# Patient Record
Sex: Male | Born: 1935 | Race: White | Hispanic: No | Marital: Married | Smoking: Former smoker
Health system: Southern US, Community
[De-identification: ages and names within clinical notes are randomized; demographics above are authoritative.]

## PROBLEM LIST (undated history)

## (undated) DIAGNOSIS — M2550 Pain in unspecified joint: Secondary | ICD-10-CM

## (undated) DIAGNOSIS — M1990 Unspecified osteoarthritis, unspecified site: Secondary | ICD-10-CM

## (undated) DIAGNOSIS — R1030 Lower abdominal pain, unspecified: Secondary | ICD-10-CM

## (undated) DIAGNOSIS — I10 Essential (primary) hypertension: Secondary | ICD-10-CM

## (undated) DIAGNOSIS — E7800 Pure hypercholesterolemia, unspecified: Secondary | ICD-10-CM

## (undated) DIAGNOSIS — K635 Polyp of colon: Secondary | ICD-10-CM

## (undated) DIAGNOSIS — IMO0001 Reserved for inherently not codable concepts without codable children: Secondary | ICD-10-CM

## (undated) DIAGNOSIS — H9190 Unspecified hearing loss, unspecified ear: Secondary | ICD-10-CM

## (undated) DIAGNOSIS — I219 Acute myocardial infarction, unspecified: Secondary | ICD-10-CM

## (undated) DIAGNOSIS — I2510 Atherosclerotic heart disease of native coronary artery without angina pectoris: Secondary | ICD-10-CM

## (undated) DIAGNOSIS — M2540 Effusion, unspecified joint: Secondary | ICD-10-CM

## (undated) DIAGNOSIS — R233 Spontaneous ecchymoses: Secondary | ICD-10-CM

## (undated) DIAGNOSIS — G4730 Sleep apnea, unspecified: Secondary | ICD-10-CM

## (undated) DIAGNOSIS — R238 Other skin changes: Secondary | ICD-10-CM

## (undated) HISTORY — PX: KNEE SURGERY: SHX244

## (undated) HISTORY — DX: Atherosclerotic heart disease of native coronary artery without angina pectoris: I25.10

## (undated) HISTORY — DX: Essential (primary) hypertension: I10

## (undated) HISTORY — DX: Pure hypercholesterolemia, unspecified: E78.00

## (undated) HISTORY — DX: Acute myocardial infarction, unspecified: I21.9

## (undated) HISTORY — DX: Unspecified osteoarthritis, unspecified site: M19.90

## (undated) HISTORY — PX: CORONARY ANGIOPLASTY: SHX604

## (undated) HISTORY — DX: Lower abdominal pain, unspecified: R10.30

## (undated) HISTORY — PX: CARDIAC CATHETERIZATION: SHX172

## (undated) HISTORY — PX: COLONOSCOPY: SHX174

---

## 1988-04-01 HISTORY — PX: CARTILAGE SURGERY: SHX1303

## 2003-01-16 ENCOUNTER — Observation Stay (HOSPITAL_COMMUNITY): Admission: EM | Admit: 2003-01-16 | Discharge: 2003-01-17 | Payer: Self-pay | Admitting: Emergency Medicine

## 2003-01-17 ENCOUNTER — Encounter: Payer: Self-pay | Admitting: Cardiology

## 2006-11-25 ENCOUNTER — Ambulatory Visit: Payer: Self-pay | Admitting: Gastroenterology

## 2006-12-01 LAB — HM COLONOSCOPY

## 2006-12-12 ENCOUNTER — Ambulatory Visit: Payer: Self-pay | Admitting: Gastroenterology

## 2006-12-12 ENCOUNTER — Encounter: Payer: Self-pay | Admitting: Gastroenterology

## 2007-01-22 ENCOUNTER — Encounter: Admission: RE | Admit: 2007-01-22 | Discharge: 2007-01-22 | Payer: Self-pay | Admitting: Orthopedic Surgery

## 2010-02-13 ENCOUNTER — Encounter: Admission: RE | Admit: 2010-02-13 | Discharge: 2010-02-13 | Payer: Self-pay | Admitting: Orthopedic Surgery

## 2010-08-17 NOTE — Discharge Summary (Signed)
NAMERANSOM, NICKSON NO.:  [id number]   MEDICAL RECORD NO.:  [id number]                   PATIENT TYPE:  OBV   LOCATION:  3738                                 FACILITY:  [facility]   PHYSICIAN:  Peter M. Swaziland, M.D.               DATE OF BIRTH:  May 10, 1934   DATE OF ADMISSION:  01/16/2003  DATE OF DISCHARGE:  01/17/2003                                 DISCHARGE SUMMARY   HISTORY OF PRESENT ILLNESS:  Mr. Roper is a 76 year old white male with  known history of coronary artery disease status post stenting of the left  anterior descending artery in 1998.  His last stress test was in June of  2002 and was normal.  The day of admission patient was working in his yard  and approximately 1 p.m. developed a left precordial chest pain that was  worse with deep breathing.  It was localized.  He had no nausea, vomiting,  or diaphoresis.  He had no shortness of breath.  His pain resolved after  admission to the emergency room.  He still had some persistent chest  soreness.  The patient has been compliant with his diet and medications.  He  had a recent physical with Gwen Pounds, M.D.  Apparently, everything  checked out well including his lipids.  He was admitted to rule out  myocardial infarction.  For details of his past medical history, social  history, family history, and physical examination, please see admission  history and physical.   LABORATORY DATA:  ECG showed normal sinus rhythm with nonspecific ST-T wave  changes.  Chest x-ray showed no active disease.  Point of care cardiac  enzyme testing was negative.  Subsequent CPK-MB and troponins were negative  x2.  His white count was 6800, hemoglobin 13.0, hematocrit 37.5, platelets  160,000.  Creatinine 1.3.  LFTs were normal.  Lipid panel was pending at the  time of discharge.  BUN 19, creatinine 1.3, sodium 141, potassium 3.9,  chloride 107, CO2 20, glucose 87.   HOSPITAL COURSE:  The patient was  admitted to telemetry.  He was treated  with subcutaneous Lovenox.  He subsequently ruled out for myocardial  infarction.  On January 17, 2003 he underwent a stress Cardiolite study.  He  was able to exercise for 11 minutes on the Bruce protocol with peak heart  rate of 129.  His target was 130.  He had no chest pain, no significant ST  segment changes.  Subsequent Cardiolite images were normal.  Ejection  fraction is calculated at 55%.  The patient was noted to be hypertensive in  the hospital with blood pressure 150/90.  We recommended doubling his Altace  dose.  Otherwise, it was felt that he was stable for discharge.  He was  discharged home on January 17, 2003.   DISCHARGE DIAGNOSES:  1. Chest pain, musculoskeletal.  2.  Arteriosclerotic coronary artery disease status post remote stenting of     the left anterior descending now with normal stress Cardiolite study.  3. Hypertension.  4. Dyslipidemia.   DISCHARGE MEDICATIONS:  1. Toprol XL 50 mg daily.  2. Aspirin 81 mg daily.  3. Lipitor resume prior dose.  4. Altace double his prior dose.    FOLLOWUP:  The patient will follow up with Peter M. Swaziland, M.D. in two  weeks.   DISCHARGE STATUS:  Improved.                                                Peter M. Swaziland, M.D.    PMJ/MEDQ  D:  01/17/2003  T:  01/18/2003  Job:  [id number]   cc:   Gwen Pounds, M.D.  [address]  [place]  [place] [address]  Fax: [phone]

## 2010-12-11 ENCOUNTER — Encounter: Payer: Self-pay | Admitting: Cardiology

## 2011-03-18 ENCOUNTER — Other Ambulatory Visit: Payer: Self-pay | Admitting: Orthopedic Surgery

## 2011-03-18 DIAGNOSIS — M169 Osteoarthritis of hip, unspecified: Secondary | ICD-10-CM

## 2011-03-19 ENCOUNTER — Ambulatory Visit
Admission: RE | Admit: 2011-03-19 | Discharge: 2011-03-19 | Disposition: A | Discharge status: Home / Self Care | Payer: Medicare Other | Source: Ambulatory Visit | Attending: Orthopedic Surgery | Admitting: Orthopedic Surgery

## 2011-03-19 DIAGNOSIS — M169 Osteoarthritis of hip, unspecified: Secondary | ICD-10-CM

## 2011-03-19 MED ORDER — METHYLPREDNISOLONE ACETATE 40 MG/ML INJ SUSP (RADIOLOG: 120.0000 mg | Freq: Once | INTRAMUSCULAR | Status: DC

## 2011-03-19 MED ORDER — IOHEXOL 180 MG/ML  SOLN: 3.0000 mL | Freq: Once | INTRAMUSCULAR | Status: AC | PRN

## 2011-04-18 DIAGNOSIS — H903 Sensorineural hearing loss, bilateral: Secondary | ICD-10-CM | POA: Diagnosis not present

## 2011-04-23 ENCOUNTER — Telehealth: Payer: Self-pay | Admitting: Cardiology

## 2011-04-23 ENCOUNTER — Other Ambulatory Visit: Payer: Self-pay | Admitting: Orthopedic Surgery

## 2011-04-23 DIAGNOSIS — M169 Osteoarthritis of hip, unspecified: Secondary | ICD-10-CM | POA: Diagnosis not present

## 2011-04-23 DIAGNOSIS — M25559 Pain in unspecified hip: Secondary | ICD-10-CM | POA: Diagnosis not present

## 2011-04-23 NOTE — Telephone Encounter (Signed)
Patient called,states he saw Dr.Jordan 10 yrs ago.Needs appointment for surgical clearance.Scheduled for rt hip replacement 05/13/11 with Dr.Frank Turner Daniels.Appointment scheduled with Dr.Jordan 05/09/11.

## 2011-04-23 NOTE — Telephone Encounter (Signed)
New problem Pt is having hip surgery -he wants surgical clearance please fax to [facility]: Lambert Rhonda W elume [phone]

## 2011-05-02 ENCOUNTER — Encounter (HOSPITAL_COMMUNITY): Payer: Self-pay | Admitting: Pharmacy Technician

## 2011-05-09 ENCOUNTER — Encounter (HOSPITAL_COMMUNITY): Payer: Self-pay

## 2011-05-09 ENCOUNTER — Other Ambulatory Visit: Payer: Self-pay | Admitting: Orthopedic Surgery

## 2011-05-09 ENCOUNTER — Encounter (HOSPITAL_COMMUNITY)
Admission: RE | Admit: 2011-05-09 | Discharge: 2011-05-09 | Disposition: A | Discharge status: Home / Self Care | Payer: Medicare Other | Source: Ambulatory Visit | Attending: Anesthesiology | Admitting: Anesthesiology

## 2011-05-09 ENCOUNTER — Encounter (HOSPITAL_COMMUNITY)
Admission: RE | Admit: 2011-05-09 | Discharge: 2011-05-09 | Disposition: A | Discharge status: Home / Self Care | Payer: Medicare Other | Source: Ambulatory Visit | Attending: Orthopedic Surgery | Admitting: Orthopedic Surgery

## 2011-05-09 ENCOUNTER — Encounter: Payer: Self-pay | Admitting: Cardiology

## 2011-05-09 ENCOUNTER — Ambulatory Visit (INDEPENDENT_AMBULATORY_CARE_PROVIDER_SITE_OTHER): Payer: Medicare Other | Admitting: Cardiology

## 2011-05-09 VITALS — BP 132/80 | HR 62 | Ht 71.0 in | Wt 201.4 lb

## 2011-05-09 DIAGNOSIS — Z01818 Encounter for other preprocedural examination: Secondary | ICD-10-CM

## 2011-05-09 DIAGNOSIS — I10 Essential (primary) hypertension: Secondary | ICD-10-CM | POA: Diagnosis not present

## 2011-05-09 DIAGNOSIS — E7800 Pure hypercholesterolemia, unspecified: Secondary | ICD-10-CM

## 2011-05-09 DIAGNOSIS — I2510 Atherosclerotic heart disease of native coronary artery without angina pectoris: Secondary | ICD-10-CM | POA: Insufficient documentation

## 2011-05-09 DIAGNOSIS — Z01811 Encounter for preprocedural respiratory examination: Secondary | ICD-10-CM | POA: Diagnosis not present

## 2011-05-09 DIAGNOSIS — E785 Hyperlipidemia, unspecified: Secondary | ICD-10-CM | POA: Insufficient documentation

## 2011-05-09 HISTORY — DX: Sleep apnea, unspecified: G47.30

## 2011-05-09 HISTORY — DX: Unspecified hearing loss, unspecified ear: H91.90

## 2011-05-09 HISTORY — DX: Polyp of colon: K63.5

## 2011-05-09 HISTORY — DX: Pain in unspecified joint: M25.50

## 2011-05-09 HISTORY — DX: Reserved for inherently not codable concepts without codable children: IMO0001

## 2011-05-09 HISTORY — DX: Effusion, unspecified joint: M25.40

## 2011-05-09 HISTORY — DX: Other skin changes: R23.8

## 2011-05-09 HISTORY — DX: Spontaneous ecchymoses: R23.3

## 2011-05-09 LAB — BASIC METABOLIC PANEL
CO2: 30 mEq/L (ref 19–32)
Calcium: 9.9 mg/dL (ref 8.4–10.5)
Chloride: 106 mEq/L (ref 96–112)
Creatinine, Ser: 0.86 mg/dL (ref 0.50–1.35)
Glucose, Bld: 97 mg/dL (ref 70–99)
Sodium: 144 mEq/L (ref 135–145)

## 2011-05-09 LAB — URINALYSIS, ROUTINE W REFLEX MICROSCOPIC
Leukocytes, UA: NEGATIVE
Nitrite: NEGATIVE
Specific Gravity, Urine: 1.015 (ref 1.005–1.030)
Urobilinogen, UA: 0.2 mg/dL (ref 0.0–1.0)
pH: 5 (ref 5.0–8.0)

## 2011-05-09 LAB — CBC
HCT: 43.6 % (ref 39.0–52.0)
MCH: 30.9 pg (ref 26.0–34.0)
MCV: 93 fL (ref 78.0–100.0)
Platelets: 135 10*3/uL — ABNORMAL LOW (ref 150–400)
RBC: 4.69 MIL/uL (ref 4.22–5.81)
WBC: 6.9 10*3/uL (ref 4.0–10.5)

## 2011-05-09 LAB — ABO/RH: ABO/RH(D): B POS

## 2011-05-09 LAB — DIFFERENTIAL
Eosinophils Absolute: 0.1 10*3/uL (ref 0.0–0.7)
Eosinophils Relative: 1 % (ref 0–5)
Lymphocytes Relative: 21 % (ref 12–46)
Lymphs Abs: 1.5 10*3/uL (ref 0.7–4.0)
Monocytes Absolute: 0.6 10*3/uL (ref 0.1–1.0)

## 2011-05-09 LAB — TYPE AND SCREEN: Antibody Screen: NEGATIVE

## 2011-05-09 LAB — SURGICAL PCR SCREEN
MRSA, PCR: NEGATIVE
Staphylococcus aureus: NEGATIVE

## 2011-05-09 LAB — APTT: aPTT: 31 seconds (ref 24–37)

## 2011-05-09 MED ORDER — CHLORHEXIDINE GLUCONATE 4 % EX LIQD: 60.0000 mL | Freq: Once | CUTANEOUS | Status: DC

## 2011-05-09 NOTE — Progress Notes (Signed)
Jeremy Sawyer Date of Birth: 1934-08-10 Medical Record #[id number]  History of Present Illness: Jeremy Sawyer is seen today for preoperative clearance for hip surgery. He is a 77 year old white male with history of coronary disease status post stenting of the LAD in 1998. His last cardiac evaluation was 2004 at which time he had a normal nuclear stress test. He reports that he has done very well. He denies any recurrent symptoms of chest pain or shortness of breath. He has been fairly active but has been limited recently by right hip arthritis. He reports his blood pressure and cholesterol have been under good control.  Current Outpatient Prescriptions on File Prior to Visit  Medication Sig Dispense Refill  . amLODipine (NORVASC) 10 MG tablet Take 10 mg by mouth daily.      Mar[place] Kitchen aspirin 81 MG tablet Take 81 mg by mouth daily.      Mar[place] Kitchen atorvastatin (LIPITOR) 40 MG tablet Take 40 mg by mouth daily.      . celecoxib (CELEBREX) 200 MG capsule Take 200 mg by mouth 2 (two) times daily.      . metoprolol succinate (TOPROL-XL) 100 MG 24 hr tablet Take 100 mg by mouth daily. Take with or immediately following a meal.      . Misc Natural Products (OSTEO BI-FLEX ADV DOUBLE ST PO) Take 1 tablet by mouth 2 (two) times daily.      . ramipril (ALTACE) 10 MG capsule Take 10 mg by mouth daily.        No Known Allergies  Past Medical History  Diagnosis Date  . Osteoarthritis     Right hip  . Groin pain     Right  . CAD (coronary artery disease)   . Hypercholesterolemia     takes Lipitor daily  . Arthritis   . Hypertension     takes Amlodipine,Ramipril,and Metoprolol daily  . Myocardial infarction     Past Surgical History  Procedure Date  . Cartilage surgery 1990    Right knee  . Knee surgery     bilateral  . Cardiac catheterization >75yrs ago  . Colonoscopy   . Coronary angioplasty     1 stent    History  Smoking status  . Former Smoker  Smokeless tobacco  . Not on file     History  Alcohol Use  . Yes    Family History  Problem Relation Age of Onset  . Heart disease Brother   . Anesthesia problems Neg Hx   . Hypotension Neg Hx   . Malignant hyperthermia Neg Hx   . Pseudochol deficiency Neg Hx     Review of Systems: The review of systems is positive for right hip pain.  All other systems were reviewed and are negative.  Physical Exam: BP 132/80  Pulse 62  Ht 5\' 11"  (1.803 m)  Wt 201 lb 6.4 oz (91.354 kg)  BMI 28.09 kg/m2 The patient is alert and oriented x 3.  The mood and affect are normal.  The skin is warm and dry.  Color is normal.  The HEENT exam reveals that the sclera are nonicteric.  The mucous membranes are moist.  The carotids are 2+ without bruits.  There is no thyromegaly.  There is no JVD.  The lungs are clear.  The chest wall is non tender.  The heart exam reveals a regular rate with a normal S1 and S2.  There are no murmurs, gallops, or rubs.  The PMI is not displaced.  Abdominal exam reveals good bowel sounds.  There is no guarding or rebound.  There is no hepatosplenomegaly or tenderness.  There are no masses.  Exam of the legs reveal no clubbing, cyanosis, or edema.  The legs are without rashes.  The distal pulses are intact.  Cranial nerves II - XII are intact.  Motor and sensory functions are intact.  The gait is normal.  LABORATORY DATA: ECG demonstrates normal sinus rhythm with a normal ECG. His most recent blood work in September 2012 showed a normal chemistry panel and CBC. Total cholesterol 132, triglycerides 74, HDL 46, LDL 71.  Assessment / Plan:

## 2011-05-09 NOTE — Assessment & Plan Note (Signed)
He is status post remote stenting of the proximal LAD. He has been asymptomatic. He is scheduled for intermediate risk surgery on his right hip. I recommended a nuclear stress test to assess his preoperative cardiovascular risk. If this is normal we can clear him for surgery.

## 2011-05-09 NOTE — Patient Instructions (Signed)
We will schedule you for a nuclear stress test. Hold your metoprolol the day of the test.  Continue your other medications.

## 2011-05-09 NOTE — Assessment & Plan Note (Signed)
His lipids are under excellent control on his current medical therapy.

## 2011-05-09 NOTE — Progress Notes (Signed)
Dr.Peter Swaziland is cardiologist and pt just left his office prior to coming for PAT  To be having a stress test on Feb 11,2013  EKG done in office today-follow up on report(not in epic yet-just done 1hour ago)

## 2011-05-09 NOTE — Pre-Procedure Instructions (Signed)
20 Jeremy Sawyer [facility]  05/09/2011   Your procedure is scheduled on:  Wed, Feb 13 @ 1:00 pm  Report to [facility] at 11 AM.  Call this number if you have problems the morning of surgery: [phone]   Remember:   Do not eat food:After Midnight.  May have clear liquids: up to 4 Hours before arrival.(until 7:00 am)  Clear liquids include soda, tea, black coffee, apple or grape juice, broth.  Take these medicines the morning of surgery with A SIP OF WATER: Amlodipine,Metoprolol,and Celebrex   Do not wear jewelry, make-up or nail polish.  Do not wear lotions, powders, or perfumes. You may wear deodorant.  Do not shave 48 hours prior to surgery.  Do not bring valuables to the hospital.  Contacts, dentures or bridgework may not be worn into surgery.  Leave suitcase in the car. After surgery it may be brought to your room.  For patients admitted to the hospital, checkout time is 11:00 AM the day of discharge.   Patients discharged the day of surgery will not be allowed to drive home.  Name and phone number of your driver:   Special Instructions: CHG Shower Use Special Wash: 1/2 bottle night before surgery and 1/2 bottle morning of surgery.   Please read over the following fact sheets that you were given: Pain Booklet, Coughing and Deep Breathing, Blood Transfusion Information, Total Joint Packet, MRSA Information and Surgical Site Infection Prevention

## 2011-05-09 NOTE — Assessment & Plan Note (Signed)
Blood pressure is well controlled on ACE inhibitor and beta blocker therapy.

## 2011-05-10 NOTE — Progress Notes (Signed)
Orders for consent read pt. To have total knee replacement ,pt. And OR schedule say he is having total hip replacement.Office has been notified.  Waiting for results of  Stress Test scheduled for 05/13/2011.

## 2011-05-13 ENCOUNTER — Ambulatory Visit (HOSPITAL_COMMUNITY): Payer: Medicare Other | Attending: Cardiology | Admitting: Radiology

## 2011-05-13 DIAGNOSIS — Z01818 Encounter for other preprocedural examination: Secondary | ICD-10-CM

## 2011-05-13 DIAGNOSIS — Z01810 Encounter for preprocedural cardiovascular examination: Secondary | ICD-10-CM | POA: Insufficient documentation

## 2011-05-13 DIAGNOSIS — Z87891 Personal history of nicotine dependence: Secondary | ICD-10-CM | POA: Insufficient documentation

## 2011-05-13 DIAGNOSIS — I10 Essential (primary) hypertension: Secondary | ICD-10-CM | POA: Insufficient documentation

## 2011-05-13 DIAGNOSIS — I2510 Atherosclerotic heart disease of native coronary artery without angina pectoris: Secondary | ICD-10-CM | POA: Diagnosis not present

## 2011-05-13 DIAGNOSIS — E785 Hyperlipidemia, unspecified: Secondary | ICD-10-CM | POA: Insufficient documentation

## 2011-05-13 DIAGNOSIS — Z8249 Family history of ischemic heart disease and other diseases of the circulatory system: Secondary | ICD-10-CM | POA: Insufficient documentation

## 2011-05-13 MED ORDER — REGADENOSON 0.4 MG/5ML IV SOLN
0.4000 mg | Freq: Once | INTRAVENOUS | Status: AC
  Administered 2011-05-13: 0.4 mg via INTRAVENOUS

## 2011-05-13 MED ORDER — TECHNETIUM TC 99M TETROFOSMIN IV KIT
10.0000 | PACK | Freq: Once | INTRAVENOUS | Status: AC | PRN
  Administered 2011-05-13: 10 via INTRAVENOUS

## 2011-05-13 MED ORDER — TECHNETIUM TC 99M TETROFOSMIN IV KIT
30.0000 | PACK | Freq: Once | INTRAVENOUS | Status: AC | PRN
  Administered 2011-05-13: 30 via INTRAVENOUS

## 2011-05-13 NOTE — Progress Notes (Signed)
[facility] SITE 3 NUCLEAR MED [address] [place] [address] [phone]  Cardiology Nuclear Med Study  Jeremy Sawyer is a 77 y.o. male [id number] 1934-09-27   Nuclear Med Background Indication for Stress Test:  Evaluation for Ischemia, Pending Surgical Clearance: Right THR, 05/15/11-Dr. Bronson Ing, and Stent Patency History: '98 Stents: LAD, >7yrs ago Heart Cath: (-) record, MI: ? Date, '04 MPS: EF: 55% (-) ischemia Cardiac Risk Factors: Family History - CAD, History of Smoking, Hypertension and Lipids  Symptoms:  none   Nuclear Pre-Procedure Caffeine/Decaff Intake:  None NPO After: 8:00pm   Lungs:  clear IV 0.9% NS with Angio Cath:  22g  IV Site: R Forearm  IV Started by:  Stanton Kidney, EMT-P  Chest Size (in):  42 Cup Size: n/a  Height: 5\' 11"  (1.803 m)  Weight:  199 lb (90.266 kg)  BMI:  Body mass index is 27.75 kg/(m^2). Tech Comments:  Metoprolol held > 36 hours, per patient. No symptoms with infusion    Nuclear Med Study 1 or 2 day study: 1 day  Stress Test Type:  Eugenie Birks  Reading MD: Marca Ancona, MD  Order Authorizing Provider:  P.Jordan  Resting Radionuclide: Technetium 54m Tetrofosmin  Resting Radionuclide Dose: 11.0 mCi   Stress Radionuclide:  Technetium 22m Tetrofosmin  Stress Radionuclide Dose: 33.0 mCi           Stress Protocol Rest HR: 68 Stress HR: 84  Rest BP: 131/67 Stress BP: 126/64  Exercise Time (min): n/a METS: n/a   Predicted Max HR: 146 bpm % Max HR: 57.53 bpm Rate Pressure Product: 86578   Dose of Adenosine (mg):  n/a Dose of Lexiscan: 0.4 mg  Dose of Atropine (mg): n/a Dose of Dobutamine: n/a mcg/kg/min (at max HR)  Stress Test Technologist: Milana Na, EMT-P  Nuclear Technologist:  Domenic Polite, CNMT     Rest Procedure:  Myocardial perfusion imaging was performed at rest 45 minutes following the intravenous administration of Technetium 82m Tetrofosmin. Rest ECG: NSR  Stress Procedure:  The  patient received IV Lexiscan 0.4 mg over 15-seconds.  Technetium 21m Tetrofosmin injected at 30-seconds.  There were non specific changes with Lexiscan.  Quantitative spect images were obtained after a 45 minute delay. Stress ECG: No significant change from baseline ECG  QPS Raw Data Images:  Normal; no motion artifact; normal heart/lung ratio. Stress Images:  Normal homogeneous uptake in all areas of the myocardium. Rest Images:  Normal homogeneous uptake in all areas of the myocardium. Subtraction (SDS):  There is no evidence of scar or ischemia. Transient Ischemic Dilatation (Normal <1.22):  1.13 Lung/Heart Ratio (Normal <0.45):  0.29  Quantitative Gated Spect Images QGS EDV:  120 ml QGS ESV:  45 ml QGS cine images:  NL LV Function; NL Wall Motion QGS EF: 63%  Impression Exercise Capacity:  Lexiscan with no exercise. BP Response:  Normal blood pressure response. Clinical Symptoms:  No symptoms with infusion ECG Impression:  No significant ST segment change suggestive of ischemia. Comparison with Prior Nuclear Study: No images to compare  Overall Impression:  Normal stress nuclear study.  Jeremy Sawyer [employer]

## 2011-05-14 ENCOUNTER — Telehealth: Payer: Self-pay | Admitting: Cardiology

## 2011-05-14 MED ORDER — CEFAZOLIN SODIUM-DEXTROSE 2-3 GM-% IV SOLR
2.0000 g | INTRAVENOUS | Status: AC
  Administered 2011-05-15: 2 g via INTRAVENOUS
  Filled 2011-05-14: qty 50

## 2011-05-14 NOTE — H&P (Signed)
  PRIMARY CARE PHYSICIAN:  Dr. Timothy Lasso  ORTHOPEDIST:  Dr. Lunette Stands.  HISTORY OF PRESENT ILLNESS:  He is a very pleasant 77 year old male with a prior history of an MI about 10 years ago status post stent who comes in with end stage right hip osteoarthritis.  He has been through several intra-articular corticosteroid injections, the last of which was in December and did not last but a few days to a week.  He is here as a referral from Dr. Charlett Blake for consideration of total hip arthroplasty.  He is limited in his activities of daily living.  In terms of ambulating, he walks with a limp and has significant amount of pain that he localizes in his right groin.  He has never had any falls.  He also does pretty well in terms of exercising.  He is active and has no chest pain or shortness of breath.  PAST MEDICAL HISTORY:  Coronary artery disease status post MI 10 years ago with resultant stent placement, hypertension, hypercholesterolemia, and arthritis.  MEDICATIONS:  Lipitor, ramipril, metoprolol, Celebrex, amlodipine, Osteo Bioflex, and aspirin.  PAST SURGICAL HISTORY:  He has had a right knee cartilage surgery back in 1990.  FAMILY HISTORY:  Positive for diabetes, hypertension, heart disease, and arthritis.  SOCIAL HISTORY:  Patient denies use of tobacco or drugs.  He drinks alcohol occasionally on a social basis.  He is retired.  ALLERGIES:  None.  Review of systems reviewed thoroughly and all other systems are negative as related to the chief complaint.  OBJECTIVE:  In general, this is a well-developed, well-nourished male in no acute distress.  Skin is warm and dry.  Neurologic:  Alert and oriented x 3.  Extraocular muscles intact.  Respiratory:  Not using any accessory muscles.  Musculoskeletal:  The right hip has about 5 degrees of internal rotation and about 15 degrees of external.  Strength is preserved.  He is neurovascularly intact distally.  He ambulates with an antalgic gait.  His left  hip has about 30-35 degrees of internal rotation and the same in terms of external rotation.  Strength is good on this side as well.  RADIOGRAPHS:  Other reviewed images:  X-rays were reviewed from September 2012 and show again end stage osteoarthritis that is bone-on-bone in his right hip with significant subchondral sclerosis and osteophytes.  There does not appear to be any deformation of the femoral head.  IMPRESSION:  End stage right-sided hip osteoarthritis.  PLAN:  I will go ahead and set him up on getting scheduled for right total hip arthroplasty.  He may continue his current medications.  With his cardiac history I would like him to either see his cardiologist or at least get a letter documenting that he is of sufficient cardiovascular reserve for intermediate risk noncardiac surgery.

## 2011-05-14 NOTE — Telephone Encounter (Signed)
Fu call °Patient returning your call °

## 2011-05-14 NOTE — Telephone Encounter (Signed)
Patient called was told myoview normal. 

## 2011-05-15 ENCOUNTER — Encounter (HOSPITAL_COMMUNITY): Payer: Self-pay | Admitting: Orthopedic Surgery

## 2011-05-15 ENCOUNTER — Ambulatory Visit (HOSPITAL_COMMUNITY): Payer: Medicare Other

## 2011-05-15 ENCOUNTER — Encounter (HOSPITAL_COMMUNITY): Payer: Self-pay | Admitting: Anesthesiology

## 2011-05-15 ENCOUNTER — Inpatient Hospital Stay (HOSPITAL_COMMUNITY)
Admission: RE | Admit: 2011-05-15 | Discharge: 2011-05-18 | DRG: 470 | Disposition: A | Discharge status: Home Health | Payer: Medicare Other | Source: Ambulatory Visit | Attending: Orthopedic Surgery | Admitting: Orthopedic Surgery

## 2011-05-15 ENCOUNTER — Ambulatory Visit (HOSPITAL_COMMUNITY): Payer: Medicare Other | Admitting: Anesthesiology

## 2011-05-15 ENCOUNTER — Encounter (HOSPITAL_COMMUNITY): Payer: Self-pay | Admitting: *Deleted

## 2011-05-15 ENCOUNTER — Encounter (HOSPITAL_COMMUNITY)
Admission: RE | Disposition: A | Discharge status: Home Health | Payer: Self-pay | Source: Ambulatory Visit | Attending: Orthopedic Surgery

## 2011-05-15 DIAGNOSIS — D500 Iron deficiency anemia secondary to blood loss (chronic): Secondary | ICD-10-CM | POA: Diagnosis not present

## 2011-05-15 DIAGNOSIS — M169 Osteoarthritis of hip, unspecified: Principal | ICD-10-CM | POA: Diagnosis present

## 2011-05-15 DIAGNOSIS — Z860100 Personal history of colon polyps, unspecified: Secondary | ICD-10-CM

## 2011-05-15 DIAGNOSIS — Z8249 Family history of ischemic heart disease and other diseases of the circulatory system: Secondary | ICD-10-CM | POA: Diagnosis not present

## 2011-05-15 DIAGNOSIS — E7800 Pure hypercholesterolemia, unspecified: Secondary | ICD-10-CM | POA: Diagnosis present

## 2011-05-15 DIAGNOSIS — I10 Essential (primary) hypertension: Secondary | ICD-10-CM | POA: Diagnosis present

## 2011-05-15 DIAGNOSIS — Z7901 Long term (current) use of anticoagulants: Secondary | ICD-10-CM

## 2011-05-15 DIAGNOSIS — Z01818 Encounter for other preprocedural examination: Secondary | ICD-10-CM | POA: Diagnosis not present

## 2011-05-15 DIAGNOSIS — Z833 Family history of diabetes mellitus: Secondary | ICD-10-CM | POA: Diagnosis not present

## 2011-05-15 DIAGNOSIS — M1611 Unilateral primary osteoarthritis, right hip: Secondary | ICD-10-CM

## 2011-05-15 DIAGNOSIS — I2510 Atherosclerotic heart disease of native coronary artery without angina pectoris: Secondary | ICD-10-CM | POA: Diagnosis present

## 2011-05-15 DIAGNOSIS — I252 Old myocardial infarction: Secondary | ICD-10-CM

## 2011-05-15 DIAGNOSIS — M1610 Unilateral primary osteoarthritis, unspecified hip: Secondary | ICD-10-CM | POA: Diagnosis not present

## 2011-05-15 DIAGNOSIS — Z96649 Presence of unspecified artificial hip joint: Secondary | ICD-10-CM | POA: Diagnosis not present

## 2011-05-15 DIAGNOSIS — Z471 Aftercare following joint replacement surgery: Secondary | ICD-10-CM | POA: Diagnosis not present

## 2011-05-15 HISTORY — PX: TOTAL HIP ARTHROPLASTY: SHX124

## 2011-05-15 SURGERY — ARTHROPLASTY, HIP, TOTAL,POSTERIOR APPROACH
Anesthesia: General | Site: Hip | Laterality: Right | Wound class: Clean

## 2011-05-15 MED ORDER — WARFARIN SODIUM 7.5 MG PO TABS
7.5000 mg | ORAL_TABLET | Freq: Once | ORAL | Status: AC
  Administered 2011-05-15: 7.5 mg via ORAL
  Filled 2011-05-15: qty 1

## 2011-05-15 MED ORDER — DIPHENHYDRAMINE HCL 12.5 MG/5ML PO ELIX
12.5000 mg | ORAL_SOLUTION | ORAL | Status: DC | PRN
  Filled 2011-05-15: qty 10

## 2011-05-15 MED ORDER — SIMVASTATIN 40 MG PO TABS
40.0000 mg | ORAL_TABLET | Freq: Every day | ORAL | Status: DC
  Administered 2011-05-15 – 2011-05-16 (×2): 40 mg via ORAL
  Filled 2011-05-15 (×2): qty 1

## 2011-05-15 MED ORDER — METOCLOPRAMIDE HCL 5 MG/ML IJ SOLN
5.0000 mg | Freq: Three times a day (TID) | INTRAMUSCULAR | Status: DC | PRN
  Filled 2011-05-15: qty 2

## 2011-05-15 MED ORDER — NEOSTIGMINE METHYLSULFATE 1 MG/ML IJ SOLN
INTRAMUSCULAR | Status: DC | PRN
  Administered 2011-05-15: 2 mg via INTRAVENOUS

## 2011-05-15 MED ORDER — ONDANSETRON HCL 4 MG PO TABS: 4.0000 mg | ORAL_TABLET | Freq: Four times a day (QID) | ORAL | Status: DC | PRN

## 2011-05-15 MED ORDER — PHENOL 1.4 % MT LIQD
1.0000 | OROMUCOSAL | Status: DC | PRN
  Filled 2011-05-15: qty 177

## 2011-05-15 MED ORDER — FENTANYL CITRATE 0.05 MG/ML IJ SOLN
INTRAMUSCULAR | Status: DC | PRN
  Administered 2011-05-15 (×5): 50 ug via INTRAVENOUS

## 2011-05-15 MED ORDER — DEXTROSE 5 % IV SOLN
500.0000 mg | Freq: Four times a day (QID) | INTRAVENOUS | Status: DC | PRN
  Administered 2011-05-15: 500 mg via INTRAVENOUS
  Filled 2011-05-15: qty 5

## 2011-05-15 MED ORDER — LACTATED RINGERS IV SOLN
INTRAVENOUS | Status: DC
  Administered 2011-05-15: 12:00:00 via INTRAVENOUS

## 2011-05-15 MED ORDER — BUPIVACAINE-EPINEPHRINE 0.5% -1:200000 IJ SOLN
INTRAMUSCULAR | Status: DC | PRN
  Administered 2011-05-15: 10 mL

## 2011-05-15 MED ORDER — ROCURONIUM BROMIDE 100 MG/10ML IV SOLN
INTRAVENOUS | Status: DC | PRN
  Administered 2011-05-15: 50 mg via INTRAVENOUS

## 2011-05-15 MED ORDER — AMLODIPINE BESYLATE 10 MG PO TABS
10.0000 mg | ORAL_TABLET | Freq: Every day | ORAL | Status: DC
  Administered 2011-05-15 – 2011-05-17 (×2): 10 mg via ORAL
  Filled 2011-05-15 (×4): qty 1

## 2011-05-15 MED ORDER — OXYCODONE HCL 5 MG PO TABS
5.0000 mg | ORAL_TABLET | ORAL | Status: DC | PRN
  Administered 2011-05-16 (×3): 10 mg via ORAL
  Filled 2011-05-15 (×3): qty 2

## 2011-05-15 MED ORDER — KCL IN DEXTROSE-NACL 20-5-0.45 MEQ/L-%-% IV SOLN
INTRAVENOUS | Status: DC
  Administered 2011-05-15 – 2011-05-16 (×2): via INTRAVENOUS
  Filled 2011-05-15 (×10): qty 1000

## 2011-05-15 MED ORDER — MIDAZOLAM HCL 5 MG/5ML IJ SOLN
INTRAMUSCULAR | Status: DC | PRN
  Administered 2011-05-15: 2 mg via INTRAVENOUS

## 2011-05-15 MED ORDER — DEXTROSE-NACL 5-0.45 % IV SOLN: INTRAVENOUS | Status: DC

## 2011-05-15 MED ORDER — PATIENT'S GUIDE TO USING COUMADIN BOOK
Freq: Once | Status: AC
  Administered 2011-05-15: 18:00:00
  Filled 2011-05-15: qty 1

## 2011-05-15 MED ORDER — METOCLOPRAMIDE HCL 10 MG PO TABS: 5.0000 mg | ORAL_TABLET | Freq: Three times a day (TID) | ORAL | Status: DC | PRN

## 2011-05-15 MED ORDER — HYDROMORPHONE HCL PF 1 MG/ML IJ SOLN
0.5000 mg | INTRAMUSCULAR | Status: DC | PRN
  Administered 2011-05-15 – 2011-05-16 (×3): 1 mg via INTRAVENOUS
  Filled 2011-05-15 (×2): qty 1

## 2011-05-15 MED ORDER — ALUM & MAG HYDROXIDE-SIMETH 200-200-20 MG/5ML PO SUSP: 30.0000 mL | ORAL | Status: DC | PRN

## 2011-05-15 MED ORDER — MAGNESIUM HYDROXIDE 400 MG/5ML PO SUSP: 30.0000 mL | Freq: Every day | ORAL | Status: DC | PRN

## 2011-05-15 MED ORDER — HYDROCODONE-ACETAMINOPHEN 5-325 MG PO TABS
1.0000 | ORAL_TABLET | ORAL | Status: DC | PRN
  Administered 2011-05-17: 2 via ORAL
  Administered 2011-05-17 (×2): 1 via ORAL
  Administered 2011-05-17 – 2011-05-18 (×2): 2 via ORAL
  Filled 2011-05-15 (×2): qty 2
  Filled 2011-05-15 (×2): qty 1
  Filled 2011-05-15: qty 2

## 2011-05-15 MED ORDER — GLYCOPYRROLATE 0.2 MG/ML IJ SOLN
INTRAMUSCULAR | Status: DC | PRN
  Administered 2011-05-15: .4 mg via INTRAVENOUS

## 2011-05-15 MED ORDER — ONDANSETRON HCL 4 MG/2ML IJ SOLN: 4.0000 mg | Freq: Four times a day (QID) | INTRAMUSCULAR | Status: DC | PRN

## 2011-05-15 MED ORDER — BISACODYL 10 MG RE SUPP: 10.0000 mg | Freq: Every day | RECTAL | Status: DC | PRN

## 2011-05-15 MED ORDER — METOPROLOL SUCCINATE ER 100 MG PO TB24
100.0000 mg | ORAL_TABLET | Freq: Every day | ORAL | Status: DC
  Administered 2011-05-15 – 2011-05-18 (×3): 100 mg via ORAL
  Filled 2011-05-15 (×4): qty 1

## 2011-05-15 MED ORDER — ZOLPIDEM TARTRATE 5 MG PO TABS: 5.0000 mg | ORAL_TABLET | Freq: Every evening | ORAL | Status: DC | PRN

## 2011-05-15 MED ORDER — HETASTARCH-ELECTROLYTES 6 % IV SOLN
INTRAVENOUS | Status: DC | PRN
  Administered 2011-05-15: 14:00:00 via INTRAVENOUS

## 2011-05-15 MED ORDER — CELECOXIB 200 MG PO CAPS
200.0000 mg | ORAL_CAPSULE | Freq: Two times a day (BID) | ORAL | Status: DC
  Administered 2011-05-15 – 2011-05-18 (×6): 200 mg via ORAL
  Filled 2011-05-15 (×7): qty 1

## 2011-05-15 MED ORDER — RAMIPRIL 10 MG PO CAPS
10.0000 mg | ORAL_CAPSULE | Freq: Every day | ORAL | Status: DC
  Administered 2011-05-15 – 2011-05-17 (×2): 10 mg via ORAL
  Filled 2011-05-15 (×4): qty 1

## 2011-05-15 MED ORDER — WARFARIN VIDEO
Freq: Once | Status: AC
  Administered 2011-05-16: 12:00:00

## 2011-05-15 MED ORDER — PROPOFOL 10 MG/ML IV EMUL
INTRAVENOUS | Status: DC | PRN
  Administered 2011-05-15: 160 mg via INTRAVENOUS
  Administered 2011-05-15: 40 mg via INTRAVENOUS

## 2011-05-15 MED ORDER — METHOCARBAMOL 100 MG/ML IJ SOLN
500.0000 mg | INTRAVENOUS | Status: AC
  Filled 2011-05-15: qty 5

## 2011-05-15 MED ORDER — SODIUM CHLORIDE 0.9 % IR SOLN
Status: DC | PRN
  Administered 2011-05-15: 1000 mL

## 2011-05-15 MED ORDER — ONDANSETRON HCL 4 MG/2ML IJ SOLN
INTRAMUSCULAR | Status: DC | PRN
  Administered 2011-05-15: 4 mg via INTRAVENOUS

## 2011-05-15 MED ORDER — LACTATED RINGERS IV SOLN
INTRAVENOUS | Status: DC | PRN
  Administered 2011-05-15 (×2): via INTRAVENOUS

## 2011-05-15 MED ORDER — HYDROMORPHONE HCL PF 1 MG/ML IJ SOLN
INTRAMUSCULAR | Status: AC
Start: 2011-05-15 — End: 2011-05-15
  Administered 2011-05-15: 1 mg via INTRAVENOUS
  Filled 2011-05-15: qty 1

## 2011-05-15 MED ORDER — ENOXAPARIN SODIUM 40 MG/0.4ML ~~LOC~~ SOLN
40.0000 mg | SUBCUTANEOUS | Status: DC
  Administered 2011-05-16: 40 mg via SUBCUTANEOUS
  Filled 2011-05-15 (×2): qty 0.4

## 2011-05-15 MED ORDER — ACETAMINOPHEN 650 MG RE SUPP: 650.0000 mg | Freq: Four times a day (QID) | RECTAL | Status: DC | PRN

## 2011-05-15 MED ORDER — FLEET ENEMA 7-19 GM/118ML RE ENEM: 1.0000 | ENEMA | Freq: Once | RECTAL | Status: AC | PRN

## 2011-05-15 MED ORDER — EPHEDRINE SULFATE 50 MG/ML IJ SOLN
INTRAMUSCULAR | Status: DC | PRN
  Administered 2011-05-15 (×5): 5 mg via INTRAVENOUS

## 2011-05-15 MED ORDER — METHOCARBAMOL 500 MG PO TABS
500.0000 mg | ORAL_TABLET | Freq: Four times a day (QID) | ORAL | Status: DC | PRN
  Administered 2011-05-15 – 2011-05-18 (×8): 500 mg via ORAL
  Filled 2011-05-15 (×8): qty 1

## 2011-05-15 MED ORDER — MENTHOL 3 MG MT LOZG: 1.0000 | LOZENGE | OROMUCOSAL | Status: DC | PRN

## 2011-05-15 MED ORDER — ACETAMINOPHEN 325 MG PO TABS: 650.0000 mg | ORAL_TABLET | Freq: Four times a day (QID) | ORAL | Status: DC | PRN

## 2011-05-15 SURGICAL SUPPLY — 56 items
BLADE SAW SAG 73X25 THK (BLADE) ×1
BLADE SAW SGTL 18X1.27X75 (BLADE) IMPLANT
BLADE SAW SGTL 73X25 THK (BLADE) ×1 IMPLANT
BLADE SAW SGTL MED 73X18.5 STR (BLADE) IMPLANT
BRUSH FEMORAL CANAL (MISCELLANEOUS) IMPLANT
CLOTH BEACON ORANGE TIMEOUT ST (SAFETY) ×2 IMPLANT
COVER BACK TABLE 24X17X13 BIG (DRAPES) IMPLANT
COVER SURGICAL LIGHT HANDLE (MISCELLANEOUS) ×3 IMPLANT
DRAPE ORTHO SPLIT 77X108 STRL (DRAPES) ×2
DRAPE PROXIMA HALF (DRAPES) ×2 IMPLANT
DRAPE SURG ORHT 6 SPLT 77X108 (DRAPES) ×1 IMPLANT
DRAPE U-SHAPE 47X51 STRL (DRAPES) ×2 IMPLANT
DRILL BIT 7/64X5 (BIT) ×2 IMPLANT
DRSG MEPILEX BORDER 4X12 (GAUZE/BANDAGES/DRESSINGS) ×1 IMPLANT
DRSG MEPILEX BORDER 4X8 (GAUZE/BANDAGES/DRESSINGS) ×1 IMPLANT
DURAPREP 26ML APPLICATOR (WOUND CARE) ×2 IMPLANT
ELECT BLADE 4.0 EZ CLEAN MEGAD (MISCELLANEOUS) ×2
ELECT REM PT RETURN 9FT ADLT (ELECTROSURGICAL) ×2
ELECTRODE BLDE 4.0 EZ CLN MEGD (MISCELLANEOUS) IMPLANT
ELECTRODE REM PT RTRN 9FT ADLT (ELECTROSURGICAL) ×1 IMPLANT
FLOSEAL 10ML (HEMOSTASIS) IMPLANT
GAUZE XEROFORM 1X8 LF (GAUZE/BANDAGES/DRESSINGS) ×1 IMPLANT
GLOVE BIO SURGEON STRL SZ7 (GLOVE) ×2 IMPLANT
GLOVE BIO SURGEON STRL SZ7.5 (GLOVE) ×2 IMPLANT
GLOVE BIOGEL PI IND STRL 7.0 (GLOVE) ×1 IMPLANT
GLOVE BIOGEL PI IND STRL 8 (GLOVE) ×1 IMPLANT
GLOVE BIOGEL PI INDICATOR 7.0 (GLOVE) ×3
GLOVE BIOGEL PI INDICATOR 8 (GLOVE) ×1
GLOVE SURG SS PI 6.5 STRL IVOR (GLOVE) ×1 IMPLANT
GOWN PREVENTION PLUS XLARGE (GOWN DISPOSABLE) ×3 IMPLANT
GOWN STRL NON-REIN LRG LVL3 (GOWN DISPOSABLE) ×4 IMPLANT
HANDPIECE INTERPULSE COAX TIP (DISPOSABLE)
HOOD PEEL AWAY FACE SHEILD DIS (HOOD) ×4 IMPLANT
KIT BASIN OR (CUSTOM PROCEDURE TRAY) ×2 IMPLANT
KIT ROOM TURNOVER OR (KITS) ×2 IMPLANT
MANIFOLD NEPTUNE II (INSTRUMENTS) ×2 IMPLANT
NEEDLE 22X1 1/2 (OR ONLY) (NEEDLE) ×2 IMPLANT
NS IRRIG 1000ML POUR BTL (IV SOLUTION) ×2 IMPLANT
PACK TOTAL JOINT (CUSTOM PROCEDURE TRAY) ×2 IMPLANT
PAD ARMBOARD 7.5X6 YLW CONV (MISCELLANEOUS) ×4 IMPLANT
PASSER SUT SWANSON 36MM LOOP (INSTRUMENTS) ×2 IMPLANT
PRESSURIZER FEMORAL UNIV (MISCELLANEOUS) IMPLANT
SET HNDPC FAN SPRY TIP SCT (DISPOSABLE) IMPLANT
SROM M HEAD 36MM PLUS 3 11/13 (Hips) ×1 IMPLANT
SUT ETHIBOND 2 V 37 (SUTURE) ×2 IMPLANT
SUT ETHILON 3 0 FSL (SUTURE) ×2 IMPLANT
SUT VIC AB 0 CTB1 27 (SUTURE) ×2 IMPLANT
SUT VIC AB 1 CTX 36 (SUTURE) ×2
SUT VIC AB 1 CTX36XBRD ANBCTR (SUTURE) ×1 IMPLANT
SUT VIC AB 2-0 CTB1 (SUTURE) ×2 IMPLANT
SYR CONTROL 10ML LL (SYRINGE) ×2 IMPLANT
TOWEL OR 17X24 6PK STRL BLUE (TOWEL DISPOSABLE) ×2 IMPLANT
TOWEL OR 17X26 10 PK STRL BLUE (TOWEL DISPOSABLE) ×2 IMPLANT
TOWER CARTRIDGE SMART MIX (DISPOSABLE) IMPLANT
TRAY FOLEY CATH 14FR (SET/KITS/TRAYS/PACK) ×1 IMPLANT
WATER STERILE IRR 1000ML POUR (IV SOLUTION) ×6 IMPLANT

## 2011-05-15 NOTE — Anesthesia Postprocedure Evaluation (Signed)
  Anesthesia Post-op Note  Patient: Jeremy Sawyer  Procedure(s) Performed: Procedure(s) (LRB): TOTAL HIP ARTHROPLASTY (Right)  Patient Location: PACU  Anesthesia Type: General  Level of Consciousness: awake, alert , oriented and patient cooperative  Airway and Oxygen Therapy: Patient Spontanous Breathing and Patient connected to nasal cannula oxygen  Post-op Pain: mild  Post-op Assessment: Post-op Vital signs reviewed, Patient's Cardiovascular Status Stable, Respiratory Function Stable, Patent Airway, No signs of Nausea or vomiting and Pain level controlled  Post-op Vital Signs: stable  Complications: No apparent anesthesia complications

## 2011-05-15 NOTE — Progress Notes (Signed)
ANTICOAGULATION CONSULT NOTE - Initial Consult  Pharmacy Consult for Coumadin Indication: VTE prophylaxis  No Known Allergies  Vital Signs: Temp: 97.7 F (36.5 C) (02/13 1622) Temp src: Oral (02/13 1059) BP: 115/64 mmHg (02/13 1622) Pulse Rate: 71  (02/13 1622)  Labs: No results found for this basename: HGB:2,HCT:3,PLT:3,APTT:3,LABPROT:3,INR:3,HEPARINUNFRC:3,CREATININE:3,CKTOTAL:3,CKMB:3,TROPONINI:3 in the last 72 hours CrCl is unknown because there is no height on file for the current visit.  Medical History: Past Medical History  Diagnosis Date  . Groin pain     Right  . CAD (coronary artery disease)   . Hypercholesterolemia     takes Lipitor daily  . Hypertension     takes Amlodipine,Ramipril,and Metoprolol daily  . Myocardial infarction   . Sleep apnea     uses CPAP;sleep study done > 94yrs ago  . Osteoarthritis     Right hip  . Arthritis   . Joint pain   . Joint swelling   . Bruises easily   . Colon polyps   . Impaired hearing     Medications:  Scheduled:    . amLODipine  10 mg Oral Daily  .  ceFAZolin (ANCEF) IV  2 g Intravenous 60 min Pre-Op  . celecoxib  200 mg Oral BID  . enoxaparin  40 mg Subcutaneous Q24H  . methocarbamol(ROBAXIN) IV  500 mg Intravenous To PACU  . metoprolol succinate  100 mg Oral Daily  . ramipril  10 mg Oral Daily  . simvastatin  40 mg Oral q1800    Assessment: 76 YOM s/p right total hip replacement to start coumadin for post-op VTE prophylaxis. Patient was not on anticoagulants prior to admission. Baseline INR 1.04 (2/7). Currently is also on lovenox 40mg  sq q24hrs.   Goal of Therapy:  INR 2-3   Plan:  1. Start coumadin 7.5 mg po x 1 2. F/u daily INR, d/c lovenox when INR > 2 3. Coumadin education book and video  Jeremy Sawyer 05/15/2011,5:19 PM

## 2011-05-15 NOTE — Interval H&P Note (Signed)
History and Physical Interval Note:  05/15/2011 12:02 PM  Jeremy Sawyer  has presented today for surgery, with the diagnosis of OSTEOARTHRITIS RIGHT HIP  The various methods of treatment have been discussed with the patient and family. After consideration of risks, benefits and other options for treatment, the patient has consented to  Procedure(s) (LRB): TOTAL HIP ARTHROPLASTY (Right) as a surgical intervention .  The patients' history has been reviewed, patient examined, no change in status, stable for surgery.  I have reviewed the patients' chart and labs.  Questions were answered to the patient's satisfaction.     Nestor Lewandowsky

## 2011-05-15 NOTE — Transfer of Care (Signed)
Immediate Anesthesia Transfer of Care Note  Patient: Jeremy Sawyer  Procedure(s) Performed: Procedure(s) (LRB): TOTAL HIP ARTHROPLASTY (Right)  Patient Location: PACU  Anesthesia Type: General  Level of Consciousness: awake, alert  and oriented  Airway & Oxygen Therapy: Patient Spontanous Breathing and Patient connected to nasal cannula oxygen  Post-op Assessment: Report given to PACU RN  Post vital signs: Reviewed and stable  Complications: No apparent anesthesia complications

## 2011-05-15 NOTE — Op Note (Signed)
OPERATIVE REPORT    DATE OF PROCEDURE:  05/15/2011       PREOPERATIVE DIAGNOSIS:  OSTEOARTHRITIS RIGHT HIP                                                          POSTOPERATIVE DIAGNOSIS:  OSTEOARTHRITIS RIGHT HIP                                                           PROCEDURE:  R total hip arthroplasty using a 54 mm DePuy Pinnacle  Cup, [employer], 10-degree polyethylene liner index superior  and posterior, a +0 36 mm ceramic head, a 18x13x42x160 SROM stem, 18FL Cone   SURGEON: Conor Lata J    ASSISTANT:   Mauricia Area, PA-C  (present throughout entire procedure and necessary for timely completion of the procedure)   ANESTHESIA: General BLOOD LOSS: 500 FLUID REPLACEMENT: 1800 crystalloid DRAINS: Foley Catheter URINE OUTPUT: 300 COMPLICATIONS: none    INDICATIONS FOR PROCEDURE: A 77 y.o. year-old With  OSTEOARTHRITIS RIGHT HIP   for 3 years, x-rays show bone-on-bone arthritic changes. Despite conservative measures with observation, anti-inflammatory medicine, narcotics, use of a cane, has severe unremitting pain and can ambulate only a few blocks before resting.  Patient desires elective R total hip arthroplasty to decrease pain and increase function. The risks, benefits, and alternatives were discussed at length including but not limited to the risks of infection, bleeding, nerve injury, stiffness, blood clots, the need for revision surgery, cardiopulmonary complications, among others, and they were willing to proceed.y have been discussed. Questions answered.     PROCEDURE IN DETAIL: The patient was identified by armband,  received preoperative IV antibiotics in the holding area at [facility], taken to the operating room , appropriate anesthetic monitors  were attached and general endotracheal anesthesia induced. Foley catheter was inserted. He was rolled into the L lateral decubitus position and fixed there with a Stulberg Mark II pelvic clamp and the R  lower extremity was then prepped and draped  in the usual sterile fashion from the ankle to the hemipelvis. A time-out  procedure was performed. The skin along the lateral hip and thigh  infiltrated with 10 mL of 0.5% Marcaine and epinephrine solution. We  then made a posterolateral approach to the hip. With a #10 blade, 15 cm  incision through skin and subcutaneous tissue down to the level of the  IT band. Small bleeders were identified and cauterized. IT band cut in  line with skin incision exposing the greater trochanter. A Cobra retractor was placed between the gluteus minimus and the superior hip joint capsule, and a spiked Cobra between the quadratus femoris and the inferior hip joint capsule. This isolated the short  external rotators and piriformis tendons. These were tagged with a #2 Ethibond  suture and cut off their insertion on the intertrochanteric crest. The posterior  capsule was then developed into an acetabular-based flap from Posterior Superior off of the acetabulum out over the femoral neck and back posterior inferior to the acetabular rim. This flap was tagged with two #2 Ethibond sutures and retracted protecting  the sciatic nerve. This exposed the arthritic femoral head and osteophytes. The hip was then flexed and internally rotated, dislocating the femoral head and a standard neck cut performed 1 fingerbreadth above the lesser trochanter.  A spiked Cobra was placed in the cotyloid notch and a Hohmann retractor was then used to lever the femur anteriorly off of the anterior pelvic column. A posterior-inferior wing retractor was placed at the junction of the acetabulum and the ischium completing the acetabular exposure.We then removed the peripheral osteophytes and labrum from the acetabulum. We then reamed the acetabulum up to 53 mm with basket reamers obtaining good coverage in all quadrants, irrigated out with normal  saline solution and hammered into place a 54 mm pinnacle cup in  45  degrees of abduction and about 20 degrees of anteversion. More  peripheral osteophytes removed and a trial 10-degree liner placed with the  index superior-posterior. The hip was then flexed and internally rotated exposing the  proximal femur, which was entered with the initiating reamer followed by  the axial reamers up to a 13.5 mm full depth and 14mm partial depth. We then conically reamed to 59F to the correct depth for a 42 base neck. The calcar was milled to 59FL. A trial cone and stem was inserted in the 25 degrees anteversion, with a +0 36mm trial head. Trial reduction was then performed and excellent stability was noted with at 90 of flexion with 75 of internal rotation and then full extension with maximal external rotation. The hip could not be dislocated in full extension. The knee could easily flex  to about 130 degrees. We also stretched the abductors at this point,  because of the preexisting adductor contractures. All trial components  were then removed. The acetabulum was irrigated out with normal saline  solution. A titanium Apex [facility] was then screwed into place  followed by a 10-degree polyethylene liner index superior-posterior. On  the femoral side a 59FL ZTT1 cone was hammered into place, followed by a [id number] SROM stem in 25 degrees of anteversion. At this point, a +0 36 mm ceramic head was  hammered on the stem. The hip was reduced. We checked our stability  one more time and found to be excellent. The wound was once again  thoroughly irrigated out with normal saline solution pulse lavage. The  capsular flap and short external rotators were repaired back to the  intertrochanteric crest through drill holes with a #2 Ethibond suture.  The IT band was closed with running 1 Vicryl suture. The subcutaneous  tissue with 0 and 2-0 undyed Vicryl suture and the skin with running  interlocking 3-0 nylon suture. Dressing of Xeroform and Mepilex was  then applied.  The patient was then unclamped, rolled supine, awaken extubated and taken to recovery room without difficulty in stable condition.   Terasa Orsini J 05/15/2011, 1:32 PM

## 2011-05-15 NOTE — Anesthesia Preprocedure Evaluation (Signed)
Anesthesia Evaluation  Patient identified by MRN, date of birth, ID band Patient awake    Reviewed: Allergy & Precautions, H&P , NPO status , Patient's Chart, lab work & pertinent test results, reviewed documented beta blocker date and time   History of Anesthesia Complications Negative for: history of anesthetic complications  Airway Mallampati: III TM Distance: <3 FB Neck ROM: Full    Dental  (+) Dental Advisory Given   Pulmonary sleep apnea and Continuous Positive Airway Pressure Ventilation ,          Cardiovascular Exercise Tolerance: Good hypertension, Pt. on home beta blockers + CAD and + Past MI     Neuro/Psych Negative Neurological ROS  Negative Psych ROS   GI/Hepatic negative GI ROS, Neg liver ROS,   Endo/Other  Negative Endocrine ROS  Renal/GU negative Renal ROS  Genitourinary negative   Musculoskeletal  (+) Arthritis -,   Abdominal   Peds  Hematology negative hematology ROS (+)   Anesthesia Other Findings   Reproductive/Obstetrics negative OB ROS                           Anesthesia Physical Anesthesia Plan  ASA: III  Anesthesia Plan: General   Post-op Pain Management:    Induction: Intravenous  Airway Management Planned: Oral ETT  Additional Equipment:   Intra-op Plan:   Post-operative Plan:   Informed Consent:   Dental advisory given  Plan Discussed with:   Anesthesia Plan Comments:         Anesthesia Quick Evaluation

## 2011-05-15 NOTE — Preoperative (Signed)
Beta Blockers   Reason not to administer Beta Blockers:Not Applicable 

## 2011-05-16 ENCOUNTER — Encounter (HOSPITAL_COMMUNITY): Payer: Self-pay | Admitting: Orthopedic Surgery

## 2011-05-16 LAB — BASIC METABOLIC PANEL
Calcium: 7.9 mg/dL — ABNORMAL LOW (ref 8.4–10.5)
Chloride: 103 mEq/L (ref 96–112)
Creatinine, Ser: 0.72 mg/dL (ref 0.50–1.35)
GFR calc Af Amer: >90 mL/min (ref >90)

## 2011-05-16 LAB — CBC
MCH: 31.5 pg (ref 26.0–34.0)
MCV: 92.4 fL (ref 78.0–100.0)
Platelets: 117 10*3/uL — ABNORMAL LOW (ref 150–400)
RDW: 12.8 % (ref 11.5–15.5)
WBC: 9.9 10*3/uL (ref 4.0–10.5)

## 2011-05-16 MED ORDER — WARFARIN SODIUM 3 MG PO TABS
3.0000 mg | ORAL_TABLET | Freq: Once | ORAL | Status: AC
  Administered 2011-05-16: 3 mg via ORAL
  Filled 2011-05-16: qty 1

## 2011-05-16 MED ORDER — ROSUVASTATIN CALCIUM 20 MG PO TABS
20.0000 mg | ORAL_TABLET | Freq: Every day | ORAL | Status: DC
  Administered 2011-05-16 – 2011-05-17 (×3): 20 mg via ORAL
  Filled 2011-05-16 (×3): qty 1

## 2011-05-16 NOTE — Progress Notes (Signed)
Patient placed on cpap auto 20 max, 5 min. Patient is tolerating settings. RT will continue to monitor.

## 2011-05-16 NOTE — Progress Notes (Signed)
Physical Therapy Treatment Patient Details Name: Jeremy Sawyer MRN: [id number] DOB: [birth year] Today's Date: 05/16/2011  PT Assessment/Plan  PT - Assessment/Plan Comments on Treatment Session: Pt admitted s/p right THA and is progressing very well.  Pt was able to ambulate BID and increase independence/distance this pm. PT Plan: Discharge plan remains appropriate;Frequency remains appropriate PT Frequency: 7X/week Follow Up Recommendations: Home health PT Equipment Recommended: 3 in 1 bedside comode;Rolling walker with 5" wheels PT Goals  Acute Rehab PT Goals PT Goal Formulation: With patient Time For Goal Achievement: 7 days PT Goal: Sit to Supine/Side - Progress: Progressing toward goal PT Goal: Sit to Stand - Progress: Progressing toward goal PT Goal: Stand to Sit - Progress: Progressing toward goal PT Goal: Ambulate - Progress: Progressing toward goal PT Goal: Perform Home Exercise Program - Progress: Progressing toward goal  PT Treatment Precautions/Restrictions  Precautions Precautions: Posterior Hip Precaution Booklet Issued: No Required Braces or Orthoses: No Restrictions Weight Bearing Restrictions: Yes RLE Weight Bearing: Weight bearing as tolerated Mobility (including Balance) Bed Mobility Bed Mobility: Yes Supine to Sit: Not tested (comment) Sit to Supine: 4: Min assist Sit to Supine - Details (indicate cue type and reason): Assist for right LE due to pain and to maintain posterior hip precautions. Transfers Transfers: Yes Sit to Stand: 4: Min assist;With upper extremity assist;From chair/3-in-1 (Min (guard)) Sit to Stand Details (indicate cue type and reason): Guarding for balance with cues for hand placement. Stand to Sit: 4: Min assist;With upper extremity assist;To bed Stand to Sit Details: Assist to slow descent with cues for hand/right LE placement. Ambulation/Gait Ambulation/Gait: Yes Ambulation/Gait Assistance: 4: Min assist (Min  (guard)) Ambulation/Gait Assistance Details (indicate cue type and reason): Guarding for balance with cues for sequence inside RW. Ambulation Distance (Feet): 180 Feet Assistive device: Rolling walker Gait Pattern: Step-to pattern;Decreased step length - right;Decreased stance time - right;Antalgic Stairs: No Wheelchair Mobility Wheelchair Mobility: No  Posture/Postural Control Posture/Postural Control: No significant limitations Balance Balance Assessed: No Exercise  Total Joint Exercises Ankle Circles/Pumps: AROM;Right;10 reps;Supine Quad Sets: AROM;Right;10 reps;Supine Heel Slides: AROM;Right;10 reps;Supine Hip ABduction/ADduction: AAROM;Right;10 reps;Supine End of Session PT - End of Session Equipment Utilized During Treatment: Gait belt Activity Tolerance: Patient tolerated treatment well Patient left: in bed;with call bell in reach Nurse Communication: Mobility status for transfers;Mobility status for ambulation General Behavior During Session: [name] for tasks performed Cognition: [facility] for tasks performed  Cephus Shelling 05/16/2011, 2:56 PM  05/16/2011 Cephus Shelling, PT, DPT [phone]

## 2011-05-16 NOTE — Progress Notes (Signed)
CARE MANAGEMENT NOTE 05/16/2011  Action/Plan:   Discharge planning.Spoke with patient and wife. Preoerpatively setup with [facility], no changes. Has rolling walker, 3in1 and a hurricane. (new type of cane per patient).   Anticipated DC Date:  05/17/2011   Anticipated DC Plan:  HOME W HOME HEALTH SERVICES      DC Planning Services  CM consult      [facility] Choice  HOME HEALTH   Choice offered to / List presented to:  C-1 Patient        HH arranged  HH-2 PT      [facility] agency  [facility] Care   Status of service:  Completed, signed off Discharge Disposition:  HOME W HOME HEALTH SERVICES

## 2011-05-16 NOTE — Progress Notes (Signed)
ANTICOAGULATION CONSULT NOTE - Follow up  Pharmacy Consult for Coumadin Indication: VTE prophylaxis  No Known Allergies  Vital Signs: Temp: 99.5 F (37.5 C) (02/14 1300) BP: 116/54 mmHg (02/14 1300) Pulse Rate: 82  (02/14 1300)  Labs:  Basename 05/16/11 0525  HGB 9.9*  HCT 29.0*  PLT 117*  APTT --  LABPROT 15.6*  INR 1.21  HEPARINUNFRC --  CREATININE 0.72  CKTOTAL --  CKMB --  TROPONINI --   CrCl is unknown because there is no height on file for the current visit.  Medical History: Past Medical History  Diagnosis Date  . Groin pain     Right  . CAD (coronary artery disease)   . Hypercholesterolemia     takes Lipitor daily  . Hypertension     takes Amlodipine,Ramipril,and Metoprolol daily  . Myocardial infarction   . Sleep apnea     uses CPAP;sleep study done > 55yrs ago  . Osteoarthritis     Right hip  . Arthritis   . Joint pain   . Joint swelling   . Bruises easily   . Colon polyps   . Impaired hearing     Medications:  Scheduled:     . amLODipine  10 mg Oral Daily  . celecoxib  200 mg Oral BID  . methocarbamol(ROBAXIN) IV  500 mg Intravenous To PACU  . metoprolol succinate  100 mg Oral Daily  . patient's guide to using coumadin book   Does not apply Once  . ramipril  10 mg Oral Daily  . simvastatin  40 mg Oral q1800  . warfarin  7.5 mg Oral ONCE-1800  . warfarin   Does not apply Once  . DISCONTD: enoxaparin  40 mg Subcutaneous Q24H    Assessment: 76 YOM s/p right total hip replacement POD# 1 on  coumadin for post-op VTE prophylaxis. Patient was not on anticoagulants prior to admission. Today INR = 1.21. Baseline INR 1.04 (2/7). H/H decreased. Lovenox discontinued by Dr. Turner Daniels this AM , bloody drainage at incision noted.  Goal of Therapy:  INR 1.5-2 per Dr. Turner Daniels  Plan:  1. Coumadin 3 mg po x 1 2. INR daily    Arman Filter 05/16/2011,2:32 PM

## 2011-05-16 NOTE — Progress Notes (Signed)
UR COMPLETED  

## 2011-05-16 NOTE — Progress Notes (Signed)
Patient ID: Jeremy Sawyer, male   DOB: Aug 13, 1934, 77 y.o.   MRN: [id number] PATIENT ID: Jeremy Sawyer  MRN: [id number]  DOB/AGE:  08/16/1934 / 77 y.o.  1 Day Post-Op Procedure(s) (LRB): TOTAL HIP ARTHROPLASTY (Right)    PROGRESS NOTE Subjective: Patient is alert, oriented,noNausea, no Vomiting, yes passing gas, no Bowel Movement. Taking PO well. Denies SOB, Chest or Calf Pain. Using Incentive Spirometer, PAS in place. Ambulate in room WBAT Patient reports pain as 3 on 0-10 scale  .    Objective: Vital signs in last 24 hours: Filed Vitals:   05/15/11 1622 05/15/11 2021 05/16/11 0245 05/16/11 0518  BP: 115/64 108/60 101/47 100/48  Pulse: 71 72 71 77  Temp: 97.7 F (36.5 C) 98.3 F (36.8 C) 98.3 F (36.8 C) 97.3 F (36.3 C)  TempSrc:      Resp: 18 18 18 18   SpO2: 99% 93% 99% 94%      Intake/Output from previous day: I/O last 3 completed shifts: In: 4045 [P.O.:520; I.V.:3025; IV Piggyback:500] Out: 1750 [Urine:1250; Blood:500]   Intake/Output this shift:     LABORATORY DATA:  Basename 05/16/11 0525  WBC 9.9  HGB 9.9*  HCT 29.0*  PLT 117*  NA 135  K 4.0  CL 103  CO2 25  BUN 13  CREATININE 0.72  GLUCOSE 150*  GLUCAP --  INR 1.21  CALCIUM 7.9*    Examination: Neurologically intact ABD soft Neurovascular intact Sensation intact distally Intact pulses distally Dorsiflexion/Plantar flexion intact Incision: soaked mepilex x 2, pt on ASA pre-op, hx of MI No cellulitis present Compartment soft} XR AP&Lat of hip shows well placed\fixed THA, dressing changed by me today, no active bleeding  Assessment:   1 Day Post-Op Procedure(s) (LRB): TOTAL HIP ARTHROPLASTY (Right) ADDITIONAL DIAGNOSIS:  Hypertension Good urine output despite bloody drainage  Plan: PT/OT WBAT, THA  posterior precautions  Hold BP meds today  DVT Prophylaxis: Lovenox\Coumadin bridge, monitor INR 1.5-2.0 target  DISCHARGE PLAN: Home  DISCHARGE NEEDS: HHPT, HHRN, CPM,  Walker and 3-in-1 comode seat

## 2011-05-16 NOTE — Discharge Instructions (Signed)
Home Health PT to be provided thru [facility] [phone]

## 2011-05-16 NOTE — Evaluation (Signed)
Occupational Therapy Evaluation Patient Details Name: Jeremy Sawyer MRN: [id number] DOB: 08-17-1934 Today's Date: 05/16/2011  Problem List:  Patient Active Problem List  Diagnoses  . CAD (coronary artery disease)  . HTN (hypertension)  . Hypercholesterolemia  . Pre-op evaluation    Past Medical History:  Past Medical History  Diagnosis Date  . Groin pain     Right  . CAD (coronary artery disease)   . Hypercholesterolemia     takes Lipitor daily  . Hypertension     takes Amlodipine,Ramipril,and Metoprolol daily  . Myocardial infarction   . Sleep apnea     uses CPAP;sleep study done > 17yrs ago  . Osteoarthritis     Right hip  . Arthritis   . Joint pain   . Joint swelling   . Bruises easily   . Colon polyps   . Impaired hearing    Past Surgical History:  Past Surgical History  Procedure Date  . Cartilage surgery 1990    Right knee  . Knee surgery     bilateral  . Cardiac catheterization >67yrs ago  . Colonoscopy   . Coronary angioplasty     1 stent    OT Assessment/Plan/Recommendation OT Assessment Clinical Impression Statement: Pt. s/p Rt. THA and with increased pain. Pt. will benefit from skilled OT to increase functional level to supervision at D/C home. OT Recommendation/Assessment: Patient will need skilled OT in the acute care venue OT Problem List: Decreased strength;Decreased activity tolerance;Impaired balance (sitting and/or standing);Decreased knowledge of use of DME or AE;Decreased knowledge of precautions;Decreased safety awareness Barriers to Discharge: None OT Therapy Diagnosis : Acute pain OT Plan OT Frequency: Min 2X/week OT Treatment/Interventions: Self-care/ADL training;DME and/or AE instruction;Therapeutic activities;Patient/family education;Balance training OT Recommendation Follow Up Recommendations: Home health OT;Supervision - Intermittent Equipment Recommended: 3 in 1 bedside comode;Rolling walker with 5" wheels Individuals  Consulted Consulted and Agree with Results and Recommendations: Patient OT Goals Acute Rehab OT Goals OT Goal Formulation: With patient Time For Goal Achievement: 7 days ADL Goals Pt Will Perform Grooming: with set-up;with supervision;Standing at sink ADL Goal: Grooming - Progress: Goal set today Pt Will Perform Lower Body Bathing: with set-up;with supervision;Sit to stand from bed;with adaptive equipment ADL Goal: Lower Body Bathing - Progress: Goal set today Pt Will Perform Lower Body Dressing: with set-up;with supervision;Sit to stand from bed;with adaptive equipment ADL Goal: Lower Body Dressing - Progress: Goal set today Pt Will Transfer to Toilet: with set-up;with supervision;with DME;Ambulation;3-in-1;Maintaining hip precautions ADL Goal: Toilet Transfer - Progress: Goal set today Pt Will Perform Tub/Shower Transfer: Shower transfer;with set-up;with supervision;with DME;Ambulation;Shower seat with back;Maintaining hip precautions ADL Goal: Tub/Shower Transfer - Progress: Goal set today  OT Evaluation Precautions/Restrictions  Precautions Precautions: Posterior Hip Precaution Booklet Issued: Yes (comment) Required Braces or Orthoses: No Restrictions Weight Bearing Restrictions: Yes RLE Weight Bearing: Weight bearing as tolerated Prior Functioning Home Living Lives With: Spouse Type of Home: House Home Layout: Able to live on main level with bedroom/bathroom Home Access: Stairs to enter Entrance Stairs-Rails: Right Entrance Stairs-Number of Steps: 4 Bathroom Shower/Tub: Health visitor: Handicapped height Bathroom Accessibility: Yes How Accessible: Accessible via walker Home Adaptive Equipment: Walker - standard;Sock aid Prior Function Level of Independence: Independent with basic ADLs;Independent with gait;Independent with transfers Able to Take Stairs?: Yes Driving: Yes Vocation: Retired ADL ADL Eating/Feeding: Performed;Independent Where Assessed  - Eating/Feeding: Chair Grooming: Performed;Wash/dry hands;Wash/dry face;Set up;Minimal assistance;Teeth care Grooming Details (indicate cue type and reason): Assist for balance Where Assessed - Grooming: Standing  at sink Upper Body Bathing: Simulated;Chest;Right arm;Left arm;Abdomen;Set up Where Assessed - Upper Body Bathing: Sitting, bed Lower Body Bathing: Simulated;Moderate assistance Lower Body Bathing Details (indicate cue type and reason): For rt. LE Where Assessed - Lower Body Bathing: Sit to stand from bed Upper Body Dressing: Performed;Minimal assistance Upper Body Dressing Details (indicate cue type and reason): with donning gown Where Assessed - Upper Body Dressing: Sitting, bed Lower Body Dressing: Simulated;Maximal assistance Lower Body Dressing Details (indicate cue type and reason): For rt. LE Where Assessed - Lower Body Dressing: Sit to stand from bed Toilet Transfer: Simulated;Minimal assistance Toilet Transfer Details (indicate cue type and reason): Mod verbal cues for hand placement on bed and RW Toilet Transfer Method: Ambulating Toilet Transfer Equipment: Other (comment) Nurse, children's) Toileting - Clothing Manipulation: Simulated;Minimal assistance Where Assessed - Glass blower/designer Manipulation: Standing Toileting - Hygiene: Simulated;Minimal assistance Where Assessed - Toileting Hygiene: Standing Tub/Shower Transfer: Not assessed Tub/Shower Transfer Method: Not assessed Equipment Used: Rolling walker Ambulation Related to ADLs: Pt. min assist for balance and for use of RW ADL Comments: Pt. educated on use of AE for LB ADLs and educated on safety with RW use.    Extremity Assessment RUE Assessment RUE Assessment: Within Functional Limits LUE Assessment LUE Assessment: Within Functional Limits Mobility  Bed Mobility Bed Mobility: Yes Supine to Sit: 1: +2 Total assist;Patient percentage (comment) (pt=60%) Supine to Sit Details (indicate cue type and reason):  Mod verbal cues for hand placement and technique Transfers Transfers: Yes Sit to Stand: 4: Min assist;With upper extremity assist;From bed Sit to Stand Details (indicate cue type and reason): Mod verbal cues for hand placement and placement of Rt. LE and WBAT    End of Session OT - End of Session Equipment Utilized During Treatment: Gait belt Activity Tolerance: Patient tolerated treatment well Patient left: in chair;with call bell in reach Nurse Communication: Mobility status for transfers General Behavior During Session: [facility] for tasks performed Cognition: [facility] for tasks performed   Co-evaluation with Flora Lipps, OTR/L Pager [phone] 05/16/2011, 9:03 AM

## 2011-05-16 NOTE — Progress Notes (Signed)
Physical Therapy Evaluation Patient Details Name: Jeremy Sawyer MRN: [id number] DOB: 11-Sep-1934 Today's Date: 05/16/2011  Problem List:  Patient Active Problem List  Diagnoses  . CAD (coronary artery disease)  . HTN (hypertension)  . Hypercholesterolemia  . Pre-op evaluation    Past Medical History:  Past Medical History  Diagnosis Date  . Groin pain     Right  . CAD (coronary artery disease)   . Hypercholesterolemia     takes Lipitor daily  . Hypertension     takes Amlodipine,Ramipril,and Metoprolol daily  . Myocardial infarction   . Sleep apnea     uses CPAP;sleep study done > 34yrs ago  . Osteoarthritis     Right hip  . Arthritis   . Joint pain   . Joint swelling   . Bruises easily   . Colon polyps   . Impaired hearing    Past Surgical History:  Past Surgical History  Procedure Date  . Cartilage surgery 1990    Right knee  . Knee surgery     bilateral  . Cardiac catheterization >56yrs ago  . Colonoscopy   . Coronary angioplasty     1 stent    PT Assessment/Plan/Recommendation PT Assessment Clinical Impression Statement: Pt is a 77 y/o male admitted s/p right THA along with the below PT problem list.  Pt would benefit from acute PT to maximize independence and facilitate d/c home with HHPT. PT Recommendation/Assessment: Patient will need skilled PT in the acute care venue PT Problem List: Decreased strength;Decreased activity tolerance;Decreased balance;Decreased mobility;Decreased knowledge of use of DME;Decreased knowledge of precautions;Pain Barriers to Discharge: None PT Therapy Diagnosis : Difficulty walking;Acute pain PT Plan PT Frequency: 7X/week PT Recommendation Follow Up Recommendations: Home health PT Equipment Recommended: 3 in 1 bedside comode;Rolling walker with 5" wheels PT Goals  Acute Rehab PT Goals PT Goal Formulation: With patient Time For Goal Achievement: 7 days Pt will go Supine/Side to Sit: with modified independence PT  Goal: Supine/Side to Sit - Progress: Goal set today Pt will go Sit to Supine/Side: with modified independence PT Goal: Sit to Supine/Side - Progress: Goal set today Pt will go Sit to Stand: with modified independence PT Goal: Sit to Stand - Progress: Goal set today Pt will go Stand to Sit: with modified independence PT Goal: Stand to Sit - Progress: Goal set today Pt will Ambulate: >150 feet;with modified independence;with least restrictive assistive device PT Goal: Ambulate - Progress: Goal set today Pt will Go Up / Down Stairs: 3-5 stairs;with min assist;with least restrictive assistive device PT Goal: Up/Down Stairs - Progress: Goal set today Pt will Perform Home Exercise Program: Independently PT Goal: Perform Home Exercise Program - Progress: Goal set today  PT Evaluation Precautions/Restrictions  Precautions Precautions: Posterior Hip Precaution Booklet Issued: Yes (comment) (Posterior hip precautions sheet given on evaluation.) Required Braces or Orthoses: No Restrictions Weight Bearing Restrictions: Yes RLE Weight Bearing: Weight bearing as tolerated Prior Functioning  Home Living Lives With: Spouse Type of Home: House Home Layout: Able to live on main level with bedroom/bathroom Home Access: Stairs to enter Entrance Stairs-Rails: Right Entrance Stairs-Number of Steps: 4 Bathroom Shower/Tub: Health visitor: Handicapped height Bathroom Accessibility: Yes How Accessible: Accessible via walker Home Adaptive Equipment: Walker - standard;Sock aid Prior Function Level of Independence: Independent with basic ADLs;Independent with gait;Independent with transfers Able to Take Stairs?: Yes Driving: Yes Vocation: Retired Financial risk analyst Arousal/Alertness: Awake/alert Overall Cognitive Status: Appears within functional limits for tasks assessed Orientation Level: Oriented  X4 Sensation/Coordination Sensation Light Touch: Appears Intact Stereognosis: Not  tested Hot/Cold: Not tested Proprioception: Not tested Coordination Gross Motor Movements are Fluid and Coordinated: Yes Fine Motor Movements are Fluid and Coordinated: Yes Extremity Assessment RUE Assessment RUE Assessment: Within Functional Limits LUE Assessment LUE Assessment: Within Functional Limits RLE Assessment RLE Assessment: Exceptions to [facility] RLE Strength RLE Overall Strength: Deficits;Due to pain RLE Overall Strength Comments: 3/5 LLE Assessment LLE Assessment: Within Functional Limits Mobility (including Balance) Bed Mobility Bed Mobility: Yes Supine to Sit: 1: +2 Total assist;Patient percentage (comment) ((pt=60%)) Supine to Sit Details (indicate cue type and reason): Assist for right LE due to pain as well as trunk to translate anterior over BOS.  Cues for sequence. Transfers Transfers: Yes Sit to Stand: 4: Min assist;With upper extremity assist;From bed Sit to Stand Details (indicate cue type and reason): Assist for balance and to off weight right LE with cues for hand placement. Stand to Sit: 4: Min assist;With upper extremity assist;To chair/3-in-1 Stand to Sit Details: Assist to slow descent with cues for hand/right LE placement. Ambulation/Gait Ambulation/Gait: Yes Ambulation/Gait Assistance: 4: Min assist Ambulation/Gait Assistance Details (indicate cue type and reason): Assist for balance with cues for sequence inside RW. Ambulation Distance (Feet): 70 Feet Assistive device: Rolling walker Gait Pattern: Step-to pattern;Decreased step length - right;Decreased stance time - right;Trunk flexed Stairs: No Wheelchair Mobility Wheelchair Mobility: No  Posture/Postural Control Posture/Postural Control: No significant limitations Balance Balance Assessed: No Exercise  Total Joint Exercises Ankle Circles/Pumps: AROM;Right;10 reps;Supine Quad Sets: AROM;Right;10 reps;Supine Heel Slides: AAROM;Right;10 reps;Supine End of Session PT - End of  Session Equipment Utilized During Treatment: Gait belt Activity Tolerance: Patient tolerated treatment well Patient left: in chair;with call bell in reach Nurse Communication: Mobility status for transfers;Mobility status for ambulation General Behavior During Session: [facility] for tasks performed Cognition: [facility] for tasks performed  Cephus Shelling 05/16/2011, 12:07 PM  05/16/2011 Cephus Shelling, PT, DPT [phone]

## 2011-05-16 NOTE — Progress Notes (Signed)
Orthopedic Tech Progress Note Patient Details:  Jeremy Sawyer 11/23/1934 [id number]  Patient ID: [name], male   DOB: 11/26/1934, 78 y.o.   MRN: [id number]   Shawnie Pons 05/16/2011, 1:49 PM Trapeze bar

## 2011-05-16 NOTE — Clinical Documentation Improvement (Signed)
Anemia Blood Loss Clarification  THIS DOCUMENT IS NOT A PERMANENT PART OF THE MEDICAL RECORD  RESPOND TO THE THIS QUERY, FOLLOW THE INSTRUCTIONS BELOW:  1. If needed, update documentation for the patient's encounter via the notes activity.  2. Access this query again and click edit on the In [employer].  3. After updating, or not, click F2 to complete all highlighted (required) fields concerning your review. Select "additional documentation in the medical record" OR "no additional documentation provided".  4. Click Sign note button.  5. The deficiency will fall out of your In Basket *Please let us know if you are not able to complete this workflow by phone or e-mail (listed below).        05/16/11  Dear Dr. Bronson Ing Marton Redwood  In an effort to better capture your patient's severity of illness, reflect appropriate length of stay and utilization of resources, a review of the patient medical record has revealed the following indicators.    Based on your clinical judgment, please clarify and document in a progress note and/or discharge summary the clinical condition associated with the following supporting information:  In responding to this query please exercise your independent judgment.  The fact that a query is asked, does not imply that any particular answer is desired or expected.  Noted  Postop H/H 9.9/29.0 down from preop H/H 14.5/43.6.   Please clarify secondary diagnosis pertaining to lab values if appropriate.  Thank you  Possible Clinical Conditions?   " Expected Acute Blood Loss Anemia  " Acute Blood Loss Anemia " Acute on chronic blood loss anemia  " Chronic blood loss anemia  " Precipitous drop in Hematocrit  " Other Condition________________  " Cannot Clinically Determine    Risk Factors: s/p Rt tot knee replacement with EBL 500,   Signs and Symptoms: asymptomatic  Diagnostics: H&H on admit:  2/6 14.5/ 43.6  Current H&H: 2/14 9.9/29.0  IV fluids /  plasma expanders: Given D 5 1/2 w 20 KCL @ 125 ml/hr post op now at 50 ml/hr, in surgery given Hextend 500 ml IV x1   Serial H&H monitoring: CBC daily  Reviewed: additional documentation in the medical record  Thank You,  Leonette Most Addison  Clinical Documentation Specialist RN, BSN:  Pager [phone]/  HIM off [phone]  Health Information Management [place]

## 2011-05-17 DIAGNOSIS — M1611 Unilateral primary osteoarthritis, right hip: Secondary | ICD-10-CM | POA: Diagnosis present

## 2011-05-17 DIAGNOSIS — D500 Iron deficiency anemia secondary to blood loss (chronic): Secondary | ICD-10-CM | POA: Diagnosis not present

## 2011-05-17 LAB — CBC
MCH: 31.7 pg (ref 26.0–34.0)
MCHC: 34.2 g/dL (ref 30.0–36.0)
Platelets: 99 10*3/uL — ABNORMAL LOW (ref 150–400)

## 2011-05-17 LAB — PROTIME-INR: Prothrombin Time: 17.7 seconds — ABNORMAL HIGH (ref 11.6–15.2)

## 2011-05-17 MED ORDER — WARFARIN SODIUM 5 MG PO TABS: 5.0000 mg | ORAL_TABLET | Freq: Every day | ORAL | Status: DC

## 2011-05-17 MED ORDER — METHOCARBAMOL 500 MG PO TABS: 500.0000 mg | ORAL_TABLET | Freq: Four times a day (QID) | ORAL | Status: AC | PRN

## 2011-05-17 MED ORDER — OXYCODONE-ACETAMINOPHEN 5-325 MG PO TABS: 1.0000 | ORAL_TABLET | ORAL | Status: AC | PRN

## 2011-05-17 NOTE — Progress Notes (Signed)
Physical Therapy Treatment Patient Details Name: Jeremy Sawyer MRN: [id number] DOB: 11-28-1934 Today's Date: 05/17/2011  PT Assessment/Plan  PT - Assessment/Plan Comments on Treatment Session: Pt admitted s/p right THA and is progressing great!  Pt able to increase ambulation distance/independence as well as negotiate stairs. PT Plan: Discharge plan remains appropriate;Frequency remains appropriate PT Frequency: 7X/week Follow Up Recommendations: Home health PT Equipment Recommended: 3 in 1 bedside comode;Rolling walker with 5" wheels PT Goals  Acute Rehab PT Goals PT Goal Formulation: With patient Time For Goal Achievement: 7 days PT Goal: Supine/Side to Sit - Progress: Progressing toward goal PT Goal: Sit to Stand - Progress: Progressing toward goal PT Goal: Stand to Sit - Progress: Progressing toward goal PT Goal: Ambulate - Progress: Progressing toward goal PT Goal: Up/Down Stairs - Progress: Progressing toward goal PT Goal: Perform Home Exercise Program - Progress: Progressing toward goal  PT Treatment Precautions/Restrictions  Precautions Precautions: Posterior Hip Precaution Booklet Issued: No Required Braces or Orthoses: No Restrictions Weight Bearing Restrictions: Yes RLE Weight Bearing: Weight bearing as tolerated Mobility (including Balance) Bed Mobility Bed Mobility: Yes Supine to Sit: 4: Min assist Supine to Sit Details (indicate cue type and reason): Assist for right LE with cues to maintain posterior hip precautions. Sit to Supine: Not Tested (comment) Transfers Transfers: Yes Sit to Stand: 4: Min assist;With upper extremity assist;From bed Sit to Stand Details (indicate cue type and reason): Assist to translate trunk anterior over BOS with cues for hand placement. Stand to Sit: 4: Min assist;With upper extremity assist;To chair/3-in-1 (Min (guard)) Stand to Sit Details: Guarding for slow descent/balance with cues for hand/right LE  placement. Ambulation/Gait Ambulation/Gait: Yes Ambulation/Gait Assistance: 5: Supervision Ambulation/Gait Assistance Details (indicate cue type and reason): Verbal cues for tall posture and safe sequence inside RW. Ambulation Distance (Feet): 220 Feet Assistive device: Rolling walker Gait Pattern: Step-to pattern;Decreased step length - right;Decreased stance time - right;Trunk flexed Stairs: Yes Stairs Assistance: 4: Min assist Stairs Assistance Details (indicate cue type and reason): Assist for balance and to off weight right LE with cues for sequence using "up with good, down with bad." Stair Management Technique: One rail Left;Step to pattern;Forwards (And right UE HHA.) Number of Stairs: 4  Height of Stairs: 8  (inches.) Wheelchair Mobility Wheelchair Mobility: No  Posture/Postural Control Posture/Postural Control: No significant limitations Balance Balance Assessed: No Exercise  Total Joint Exercises Ankle Circles/Pumps: AROM;Right;10 reps;Supine Quad Sets: AROM;Right;10 reps;Supine Short Arc Quad: AROM;Right;10 reps;Supine Heel Slides: AROM;Right;10 reps;Supine Hip ABduction/ADduction: AROM;Right;10 reps;Supine End of Session PT - End of Session Equipment Utilized During Treatment: Gait belt Activity Tolerance: Patient tolerated treatment well Patient left: in chair;with call bell in reach Nurse Communication: Mobility status for transfers;Mobility status for ambulation General Behavior During Session: [facility] for tasks performed Cognition: [facility] for tasks performed  Cephus Shelling 05/17/2011, 10:15 AM  05/17/2011 Cephus Shelling, PT, DPT [phone]

## 2011-05-17 NOTE — Progress Notes (Addendum)
PATIENT ID: Jeremy Sawyer  MRN: [id number]  DOB/AGE:  Jan 28, 1935 / 77 y.o.  2 Days Post-Op Procedure(s) (LRB): TOTAL HIP ARTHROPLASTY (Right)    PROGRESS NOTE Subjective: Patient is alert, oriented,noNausea, no Vomiting, yes passing gas, no Bowel Movement. Taking PO well. Denies SOB, Chest or Calf Pain. Using Incentive Spirometer, PAS in place. Ambulating well with PT. Patient reports pain as moderate  .    Objective: Vital signs in last 24 hours: Filed Vitals:   05/16/11 1300 05/16/11 1313 05/16/11 2250 05/17/11 0631  BP: 116/54  110/50 117/53  Pulse: 82  85 84  Temp: 99.5 F (37.5 C)  98.9 F (37.2 C) 98 F (36.7 C)  TempSrc:      Resp: 18  18 18   Height:  5\' 11"  (1.803 m)    Weight:  90.266 kg (199 lb)    SpO2: 94%  96% 98%      Intake/Output from previous day: I/O last 3 completed shifts: In: 3639.2 [P.O.:760; I.V.:2879.2] Out: 2800 [Urine:2800]   Intake/Output this shift:     LABORATORY DATA:  Basename 05/17/11 0700 05/16/11 0525  WBC -- 9.9  HGB -- 9.9*  HCT -- 29.0*  PLT -- 117*  NA -- 135  K -- 4.0  CL -- 103  CO2 -- 25  BUN -- 13  CREATININE -- 0.72  GLUCOSE -- 150*  GLUCAP -- --  INR 1.43 1.21  CALCIUM -- 7.9*    Examination: Neurologically intact ABD soft Neurovascular intact Sensation intact distally Intact pulses distally Incision: scant drainage} XR AP&Lat of hip shows well placed\fixed THA  Assessment:   2 Days Post-Op Procedure(s) (LRB): TOTAL HIP ARTHROPLASTY (Right) ADDITIONAL DIAGNOSIS:  Expected bood loss anemia  Plan: PT/OT WBAT, THA  posterior precautions  DVT Prophylaxis: Lovenox has been discontinued.  Will also d/c Coumadin today - platelet count less than 100,000.  DISCHARGE PLAN: Home Saturday  DISCHARGE NEEDS: HHPT, HHRN, Walker and 3-in-1 comode seat

## 2011-05-17 NOTE — Progress Notes (Signed)
ANTICOAGULATION CONSULT NOTE - Follow up  Pharmacy Consult for Coumadin - now discontinued Indication: VTE prophylaxis  No Known Allergies  Vital Signs: Temp: 98 F (36.7 C) (02/15 0631) BP: 117/53 mmHg (02/15 0631) Pulse Rate: 84  (02/15 0631)  Labs:  Basename 05/17/11 0700 05/16/11 0525  HGB 8.9* 9.9*  HCT 26.0* 29.0*  PLT 99* 117*  APTT -- --  LABPROT 17.7* 15.6*  INR 1.43 1.21  HEPARINUNFRC -- --  CREATININE -- 0.72  CKTOTAL -- --  CKMB -- --  TROPONINI -- --   Estimated Creatinine Clearance: 86.3 ml/min (by C-G formula based on Cr of 0.72).  Medical History: Past Medical History  Diagnosis Date  . Groin pain     Right  . CAD (coronary artery disease)   . Hypercholesterolemia     takes Lipitor daily  . Hypertension     takes Amlodipine,Ramipril,and Metoprolol daily  . Myocardial infarction   . Sleep apnea     uses CPAP;sleep study done > 20yrs ago  . Osteoarthritis     Right hip  . Arthritis   . Joint pain   . Joint swelling   . Bruises easily   . Colon polyps   . Impaired hearing     Medications:  Scheduled:     . amLODipine  10 mg Oral Daily  . celecoxib  200 mg Oral BID  . methocarbamol(ROBAXIN) IV  500 mg Intravenous To PACU  . metoprolol succinate  100 mg Oral Daily  . ramipril  10 mg Oral Daily  . rosuvastatin  20 mg Oral q1800  . warfarin  3 mg Oral ONCE-1800  . warfarin   Does not apply Once  . DISCONTD: simvastatin  40 mg Oral q1800    Assessment: 76 YOM s/p right total hip replacement POD# 1 on  coumadin for post-op VTE prophylaxis. Patient was not on anticoagulants prior to admission. Today INR = 1.43. Baseline INR 1.04 (2/7). H/H decreased. Lovenox discontinued by Dr. Turner Daniels yesterday , bloody drainage at incision noted per chart yesterday, scant today.  Coumadin was discontinued today.   Plan:  1. D/C Coumadin. 2. Pharmacy will sign off, please contact if questions.     Gardner Candle 05/17/2011,8:36 AM

## 2011-05-17 NOTE — Progress Notes (Signed)
Placed patient on cpap 10cmH20. Patient is tolerating settings well. RT will continue to monitor.

## 2011-05-17 NOTE — Progress Notes (Signed)
Occupational Therapy Treatment Patient Details Name: Jeremy Sawyer MRN: [id number] DOB: [birth year] Today's Date: 05/17/2011  OT Assessment/Plan OT Assessment/Plan OT Plan: Discharge plan remains appropriate OT Frequency: Min 2X/week Follow Up Recommendations: Home health OT;Supervision - Intermittent Equipment Recommended: 3 in 1 bedside comode;Rolling walker with 5" wheels OT Goals Acute Rehab OT Goals OT Goal Formulation: With patient Time For Goal Achievement: 7 days ADL Goals Pt Will Perform Grooming: with set-up;with supervision;Standing at sink ADL Goal: Grooming - Progress: Met Pt Will Perform Lower Body Bathing: with set-up;with supervision;Sit to stand from bed;with adaptive equipment ADL Goal: Lower Body Bathing - Progress: Progressing toward goals Pt Will Perform Lower Body Dressing: with set-up;with supervision;Sit to stand from bed;with adaptive equipment ADL Goal: Lower Body Dressing - Progress: Progressing toward goals Pt Will Transfer to Toilet: with set-up;with supervision;with DME;Ambulation;3-in-1;Maintaining hip precautions ADL Goal: Toilet Transfer - Progress: Progressing toward goals Pt Will Perform Tub/Shower Transfer: Shower transfer;with set-up;with supervision;with DME;Ambulation;Shower seat with back;Maintaining hip precautions ADL Goal: Tub/Shower Transfer - Progress: Progressing toward goals  OT Treatment Precautions/Restrictions  Precautions Precautions: Posterior Hip Precaution Booklet Issued: No Required Braces or Orthoses: No Restrictions Weight Bearing Restrictions: Yes RLE Weight Bearing: Weight bearing as tolerated   ADL ADL Grooming: Performed;Wash/dry hands;Set up;Supervision/safety Where Assessed - Grooming: Standing at sink Lower Body Bathing: Simulated;Set up Lower Body Bathing Details (indicate cue type and reason): With use of long handled sponge Where Assessed - Lower Body Bathing: Sit to stand from chair Lower Body  Dressing: Simulated;Minimal assistance Lower Body Dressing Details (indicate cue type and reason): With use of reacher for dong/doffing of undergarments and pants Where Assessed - Lower Body Dressing: Sit to stand from chair Toilet Transfer: Performed;Minimal assistance Toilet Transfer Details (indicate cue type and reason): Mod verbal cues for hand placement on 3-in-1 and RW Toilet Transfer Method: Ambulating Toilet Transfer Equipment: Raised toilet seat with arms (or 3-in-1 over toilet) Toileting - Hygiene: Simulated;Set up Where Assessed - Toileting Hygiene: Standing Tub/Shower Transfer: Minimal assistance;Simulated Tub/Shower Transfer Details (indicate cue type and reason): Mod verbal cues for technique of posterior entrance into shower stall with bil LE position and hand placement Tub/Shower Transfer Method: Ambulating Tub/Shower Transfer Equipment: Shower seat with back;Walk in shower Equipment Used: Rolling walker;Reacher Ambulation Related to ADLs: Pt. min guard assist ~30' with RW  ADL Comments: Pt. educated on use of AE for LB ADLs and educated on safety with RW use. Mobility  Bed Mobility Bed Mobility: No Transfers Sit to Stand: 4: Min assist;With upper extremity assist;From bed Stand to Sit: 4: Min assist;With upper extremity assist;To chair/3-in-1     End of Session OT - End of Session Equipment Utilized During Treatment: Gait belt Activity Tolerance: Patient tolerated treatment well Patient left: in chair;with call bell in reach Nurse Communication: Mobility status for transfers General Behavior During Session: [facility] for tasks performed Cognition: [facility] for tasks performed  Ashland Osmer, OTR/L Pager [phone]  05/17/2011, 12:47 PM

## 2011-05-17 NOTE — Progress Notes (Signed)
Physical Therapy Treatment Note   05/17/11 1355  PT Visit Information  Last PT Received On 05/17/11  Precautions  Precautions Posterior Hip  Restrictions  RLE Weight Bearing WBAT  Bed Mobility  Supine to Sit 4: Min assist (increased time required)  Supine to Sit Details (indicate cue type and reason) verbal cues to adhere to posterior hip precautions  Transfers  Sit to Stand 4: Min assist  Sit to Stand Details (indicate cue type and reason) max verbal cues for saftey, hand placement, and to adhere to R post hip precuations  Stand to Sit 4: Min assist;To chair/3-in-1  Stand to Sit Details contact guard  Ambulation/Gait  Ambulation/Gait Assistance 5: Supervision  Ambulation/Gait Assistance Details (indicate cue type and reason) verbal cues to achieve erect posture  Ambulation Distance (Feet) 300 Feet  Assistive device Rolling walker  Gait Pattern Step-to pattern;Decreased step length - right;Decreased stance time - right  Stairs No  Wheelchair Mobility  Wheelchair Mobility No  Posture/Postural Control  Posture/Postural Control No significant limitations  Balance  Balance Assessed No  Exercises  Exercises Total Joint  Total Joint Exercises  Hip ABduction/ADduction AROM;Right;20 reps;Standing  Marching in Standing AROM;Right;Other reps (comment);Standing (2x 10)  Standing Hip Extension AROM;Right;Other reps (comment);Standing (2x10)  General Exercises - Lower Extremity  Heel Raises AROM;Right;Standing (2s10)  Mini-Sqauts AROM;Both;20 reps;Standing  PT - End of Session  Equipment Utilized During Treatment Gait belt  Activity Tolerance Patient tolerated treatment well  Patient left in chair;with call bell in reach  Nurse Communication Mobility status for ambulation  General  Behavior During Session [facility] for tasks performed  Cognition [facility] for tasks performed  PT - Assessment/Plan  Comments on Treatment Session Patient tolerated tx well however remians to have difficulty  adhereing to post hip precautions during transfers even though he can recall them.  PT Plan Discharge plan remains appropriate;Frequency remains appropriate  PT Frequency 7X/week  Follow Up Recommendations Home health PT  Acute Rehab PT Goals  PT Goal: Supine/Side to Sit - Progress Progressing toward goal  PT Goal: Sit to Supine/Side - Progress Progressing toward goal  PT Goal: Sit to Stand - Progress Progressing toward goal  PT Goal: Stand to Sit - Progress Progressing toward goal  PT Goal: Ambulate - Progress Progressing toward goal  PT Goal: Up/Down Stairs - Progress Progressing toward goal  PT Goal: Perform Home Exercise Program - Progress Progressing toward goal    Pain: 3/10 R hip  Lewis Shock, PT, DPT Pager #: [phone] Office #: [phone]

## 2011-05-18 LAB — CBC
HCT: 25.7 % — ABNORMAL LOW (ref 39.0–52.0)
Hemoglobin: 8.8 g/dL — ABNORMAL LOW (ref 13.0–17.0)
MCH: 31.4 pg (ref 26.0–34.0)
MCHC: 34.2 g/dL (ref 30.0–36.0)
MCV: 91.8 fL (ref 78.0–100.0)
Platelets: 106 K/uL — ABNORMAL LOW (ref 150–400)
RBC: 2.8 MIL/uL — ABNORMAL LOW (ref 4.22–5.81)
RDW: 12.8 % (ref 11.5–15.5)
WBC: 9.1 K/uL (ref 4.0–10.5)

## 2011-05-18 LAB — PROTIME-INR
INR: 1.35 (ref 0.00–1.49)
Prothrombin Time: 16.9 s — ABNORMAL HIGH (ref 11.6–15.2)

## 2011-05-18 NOTE — Progress Notes (Signed)
   CARE MANAGEMENT NOTE 05/18/2011  Patient:  Jeremy Sawyer, Jeremy Sawyer   Account Number:  [id number]  Date Initiated:  05/16/2011  Documentation initiated by:  Vance Peper  Subjective/Objective Assessment:   77 yr old male s/p right total hip arthroplasty.     Action/Plan:   Discharge planning.Spoke with patient and wife. Preoerpatively setup with [facility], no changes. Has rolling walker, 3in1 and a hurricane. (new type of cane per patient).   Anticipated DC Date:  05/17/2011   Anticipated DC Plan:  HOME W HOME HEALTH SERVICES      DC Planning Services  CM consult      [facility] Choice  HOME HEALTH   Choice offered to / List presented to:  C-1 Patient   DME arranged  Levan Hurst      DME agency  [facility]     HH arranged  HH-2 PT      [facility] agency  [facility] Care   Status of service:  Completed, signed off Medicare Important Message given?   (If response is "NO", the following Medicare IM given date fields will be blank) Date Medicare IM given:   Date Additional Medicare IM given:    Discharge Disposition:  HOME W HOME HEALTH SERVICES  Per UR Regulation:    Comments:  05/18/2011 1115 Spoke NCM, Vance Peper RN and referral was made to [facility] for [facility]. Contacted [facility] for RW for home. Jeremy Donning RN CCM Case Mgmt phone [phone]

## 2011-05-18 NOTE — Progress Notes (Signed)
Physical Therapy Treatment Patient Details Name: Jeremy Sawyer MRN: [id number] DOB: October 28, 1934 Today's Date: 05/18/2011  PT Assessment/Plan  PT - Assessment/Plan Comments on Treatment Session: Pt admitted s/p right THA and is ready for d/c home once medically cleared by MD. PT Plan: Discharge plan remains appropriate;Frequency remains appropriate PT Frequency: 7X/week Follow Up Recommendations: Home health PT Equipment Recommended: 3 in 1 bedside comode;Rolling walker with 5" wheels PT Goals  Acute Rehab PT Goals PT Goal Formulation: With patient Time For Goal Achievement: 7 days PT Goal: Sit to Stand - Progress: Met PT Goal: Stand to Sit - Progress: Met PT Goal: Ambulate - Progress: Progressing toward goal PT Goal: Up/Down Stairs - Progress: Progressing toward goal PT Goal: Perform Home Exercise Program - Progress: Progressing toward goal  PT Treatment Precautions/Restrictions  Precautions Precautions: Posterior Hip Precaution Booklet Issued: No Required Braces or Orthoses: No Restrictions Weight Bearing Restrictions: Yes RLE Weight Bearing: Weight bearing as tolerated Mobility (including Balance) Bed Mobility Bed Mobility: No Supine to Sit: Not tested (comment) Sit to Supine: Not Tested (comment) Transfers Transfers: Yes Sit to Stand: 6: Modified independent (Device/Increase time) Sit to Stand Details (indicate cue type and reason): Min verbal cues to follow posterior hip precautions Stand to Sit: 6: Modified independent (Device/Increase time) Ambulation/Gait Ambulation/Gait: Yes Ambulation/Gait Assistance: 5: Supervision Ambulation/Gait Assistance Details (indicate cue type and reason): Verbal cues for safest sequence inside RW. Ambulation Distance (Feet): 350 Feet Assistive device: Rolling walker Gait Pattern: Step-to pattern;Decreased step length - right;Decreased stance time - right Stairs: Yes Stairs Assistance: 4: Min assist (Min (guard)) Stairs  Assistance Details (indicate cue type and reason): Guarding for balance only.  Pt able to recall "up with good, down with bad." Stair Management Technique: One rail Left;Step to pattern;Forwards Number of Stairs: 2  Height of Stairs: 8  (inches) Wheelchair Mobility Wheelchair Mobility: No  Posture/Postural Control Posture/Postural Control: No significant limitations Balance Balance Assessed: No Exercise  Total Joint Exercises Hip ABduction/ADduction: AROM;Right;10 reps;Standing Long Arc Quad: AROM;Right;10 reps;Seated Knee Flexion: AROM;Right;10 reps;Standing Marching in Standing: AROM;Right;10 reps;Standing Standing Hip Extension: AROM;Right;10 reps;Standing End of Session PT - End of Session Equipment Utilized During Treatment: Gait belt Activity Tolerance: Patient tolerated treatment well Patient left: in chair;with call bell in reach Nurse Communication: Mobility status for ambulation;Mobility status for transfers General Behavior During Session: [facility] for tasks performed Cognition: [facility] for tasks performed  Cephus Shelling 05/18/2011, 11:45 AM  05/18/2011 Cephus Shelling, PT, DPT [phone]

## 2011-05-18 NOTE — Progress Notes (Signed)
D/C instructions reviewed with patient and son. RX x 3 given. Rolling walker delivered to the room. hh services arranged with [facility]. All questions answered. Instructions given regarding b/p meds per Dr Yevette Edwards: Hold norvasc and altace today and tomorrow. Continue to take metoprolol (dose given today prior to d/c) monitor b/p over w/e, call cardiologist on Monday morning for further instruction. This was written on pts copy of d/c instructions and reviewed with pt and son. Both verbalized comfort with the plan. Pt d/c'ed via wheelchair in stable condition

## 2011-05-18 NOTE — Progress Notes (Signed)
   CARE MANAGEMENT NOTE 05/18/2011  Patient:  Jeremy Sawyer, Jeremy Sawyer   Account Number:  [id number]  Date Initiated:  05/16/2011  Documentation initiated by:  Vance Peper  Subjective/Objective Assessment:   77 yr old male s/p right total hip arthroplasty.     Action/Plan:   Discharge planning.Spoke with patient and wife. Preoerpatively setup with [facility], no changes. Has rolling walker, 3in1 and a hurricane. (new type of cane per patient).   Anticipated DC Date:  05/17/2011   Anticipated DC Plan:  HOME W HOME HEALTH SERVICES      DC Planning Services  CM consult      [facility] Choice  HOME HEALTH   Choice offered to / List presented to:  C-1 Patient   DME arranged  Levan Hurst      DME agency  [facility]     [facility] arranged  HH-2 PT  HH-1 RN      [facility] agency  [facility] Care   Status of service:  Completed, signed off Medicare Important Message given?   (If response is "NO", the following Medicare IM given date fields will be blank) Date Medicare IM given:   Date Additional Medicare IM given:    Discharge Disposition:  HOME W HOME HEALTH SERVICES  Per UR Regulation:    Comments:  05/18/2011 1150 Contacted [facility] to make aware that pt was d/c today with HH. Spoke to rep.  States pt will need Coumadin management per Unit RN. Faxed orders to [facility] for [facility] RN Coumadin Mgmt. Isidoro Donning RN CCM Case Mgmt phone [phone]  05/18/2011 1115 Spoke NCM, Vance Peper RN and referral was made to [facility] for [facility]. Contacted [facility] for RW for home. Isidoro Donning RN CCM Case Mgmt phone [phone]

## 2011-05-18 NOTE — Progress Notes (Signed)
Patient looks comfortable  AVSS NVI Dressing intact  Platelets 106,000  POD #3 after R THA - d/c home today with coumadin and pain meds per Dr. Wadie Lessen instruction - f/u with Dr. Turner Daniels

## 2011-05-18 NOTE — Progress Notes (Signed)
Occupational Therapy Treatment Patient Details Name: Jeremy Sawyer MRN: [id number] DOB: September 24, 1934 Today's Date: 05/18/2011  OT Assessment/Plan OT Assessment/Plan Comments on Treatment Session: pt. progressing well and educated on LB ADLs and shower transfer technique with minimal verbal cuing for recall of transfer. Pt. anticipates D/C home today. OT Plan: Discharge plan remains appropriate OT Frequency: Min 2X/week Follow Up Recommendations: Home health OT;Supervision - Intermittent Equipment Recommended: 3 in 1 bedside comode;Rolling walker with 5" wheels OT Goals Acute Rehab OT Goals OT Goal Formulation: With patient Time For Goal Achievement: 7 days ADL Goals Pt Will Perform Grooming: with set-up;with supervision;Standing at sink ADL Goal: Grooming - Progress: Progressing toward goals Pt Will Perform Lower Body Bathing: with set-up;with supervision;Sit to stand from bed;with adaptive equipment ADL Goal: Lower Body Bathing - Progress: Met Pt Will Perform Lower Body Dressing: with set-up;with supervision;Sit to stand from bed;with adaptive equipment ADL Goal: Lower Body Dressing - Progress: Met Pt Will Transfer to Toilet: with set-up;with supervision;with DME;Ambulation;3-in-1;Maintaining hip precautions ADL Goal: Toilet Transfer - Progress: Met Pt Will Perform Tub/Shower Transfer: Shower transfer;with set-up;with supervision;with DME;Ambulation;Shower seat with back;Maintaining hip precautions ADL Goal: Tub/Shower Transfer - Progress: Met  OT Treatment Precautions/Restrictions  Precautions Precautions: Posterior Hip Precaution Booklet Issued: No Required Braces or Orthoses: No Restrictions RLE Weight Bearing: Weight bearing as tolerated   ADL ADL Lower Body Dressing: Performed;Set up;Supervision/safety Lower Body Dressing Details (indicate cue type and reason): With use of reacher for dong/doffing of undergarments and pants Where Assessed - Lower Body Dressing: Sit  to stand from chair Toilet Transfer: Performed;Supervision/safety Toilet Transfer Method: Proofreader: Raised toilet seat with arms (or 3-in-1 over toilet) Tub/Shower Transfer: Landscape architect Details (indicate cue type and reason): Min verbal cues for hand placement and posterior entrance technique Tub/Shower Transfer Method: Science writer: Shower seat with back Equipment Used: Sock aid;Rolling walker;Reacher Ambulation Related to ADLs: Pt. provided with close supervision ~100' with RW ADL Comments: Pt. educated on use of AE for LB ADLs and educated on safety with RW use. Mobility  Transfers Sit to Stand: 5: Supervision Sit to Stand Details (indicate cue type and reason): Min verbal cues to follow posterior hip precautions End of Session  Pt. Left ambulating in the hallway with Flora Lipps, OTR/L Pager [phone]  05/18/2011, 9:22 AM

## 2011-05-19 DIAGNOSIS — R269 Unspecified abnormalities of gait and mobility: Secondary | ICD-10-CM | POA: Diagnosis not present

## 2011-05-19 DIAGNOSIS — Z471 Aftercare following joint replacement surgery: Secondary | ICD-10-CM | POA: Diagnosis not present

## 2011-05-19 DIAGNOSIS — Z96649 Presence of unspecified artificial hip joint: Secondary | ICD-10-CM | POA: Diagnosis not present

## 2011-05-19 DIAGNOSIS — I10 Essential (primary) hypertension: Secondary | ICD-10-CM | POA: Diagnosis not present

## 2011-05-19 DIAGNOSIS — M6281 Muscle weakness (generalized): Secondary | ICD-10-CM | POA: Diagnosis not present

## 2011-05-20 ENCOUNTER — Telehealth: Payer: Self-pay | Admitting: Cardiology

## 2011-05-20 DIAGNOSIS — R269 Unspecified abnormalities of gait and mobility: Secondary | ICD-10-CM | POA: Diagnosis not present

## 2011-05-20 DIAGNOSIS — Z96649 Presence of unspecified artificial hip joint: Secondary | ICD-10-CM | POA: Diagnosis not present

## 2011-05-20 DIAGNOSIS — I10 Essential (primary) hypertension: Secondary | ICD-10-CM | POA: Diagnosis not present

## 2011-05-20 DIAGNOSIS — Z471 Aftercare following joint replacement surgery: Secondary | ICD-10-CM | POA: Diagnosis not present

## 2011-05-20 DIAGNOSIS — M6281 Muscle weakness (generalized): Secondary | ICD-10-CM | POA: Diagnosis not present

## 2011-05-20 NOTE — Telephone Encounter (Signed)
New Msg: Jeremy Sawyer from [facility] calling regarding pt PT/INR results. Results will be faxed over to our office and pt orthopedic, Jeremy Sawyer.   INR: 1.4 PT: 14.1   Pt is on 5 mg daily coumadin. Based on INR MD is going to determine if pt should be put back on some medications. Pt has already taken dose for today.   Please return call to discuss further if necessary.

## 2011-05-20 NOTE — Telephone Encounter (Signed)
05/20/11--mary from [facility] calling to let dr Swaziland know mr Dunnaway's pt/inr--pt=14.1---inr=1.4--will pass information to dr jordan--nt

## 2011-05-21 DIAGNOSIS — I10 Essential (primary) hypertension: Secondary | ICD-10-CM | POA: Diagnosis not present

## 2011-05-21 DIAGNOSIS — M6281 Muscle weakness (generalized): Secondary | ICD-10-CM | POA: Diagnosis not present

## 2011-05-21 DIAGNOSIS — R269 Unspecified abnormalities of gait and mobility: Secondary | ICD-10-CM | POA: Diagnosis not present

## 2011-05-21 DIAGNOSIS — Z96649 Presence of unspecified artificial hip joint: Secondary | ICD-10-CM | POA: Diagnosis not present

## 2011-05-21 DIAGNOSIS — Z471 Aftercare following joint replacement surgery: Secondary | ICD-10-CM | POA: Diagnosis not present

## 2011-05-21 NOTE — Telephone Encounter (Signed)
Patient is on coumadin for recent orthopedic surgery. I'm not aware that we are managing his coumadin. If so this needs to be forwarded to the Coumadin clinic. Banita Lehn [place] MD, [facility]

## 2011-05-22 NOTE — Telephone Encounter (Signed)
FU Call: [facility] nurse calling wanting to speak with nurse/MD regarding pt INR. Orthopedic is managing pt coumadin. Pt was taken off three BP medications while in the hospital and it was to be discussed if pt should be put back on medications after PT/INR was received. Therefore based on the PT/INR Corrie Dandy would like to know if MD would like pt to resume taking medications (norvasc, metoprolol, lisinopril). Please return [facility] call to discuss further.

## 2011-05-22 NOTE — Telephone Encounter (Signed)
I would resume Norvasc but continue to hold Altace for now. Otis Portal [place] MD, [facility]

## 2011-05-22 NOTE — Telephone Encounter (Signed)
Pt will restart Norvasc today. Continue to keep blood pressure log. Mylo Red RN

## 2011-05-22 NOTE — Telephone Encounter (Signed)
1.  I spoke with Corrie Dandy [facility] nurse) and Dr. Turner Daniels is monitoring pt Coumadin. 2. After reading through hospital notes, Pt was to call this past Monday with BP readings.  His Altace and Norvasc had been stopped while in the hospital due to hypotension.    3.  I have called pt and he has given me daily BP readings. All of which are before taking his Toprol.  His wife ( who is a Engineer, civil (consulting)) checked his BP at my request post medication.  Readings are as follows: 2/16  106/55 2/17  122/68 2/18  128/72 2/19  126/75 2/20  136/83     Now BP is 158/89   Pt will await call back whether to restart Norvasc and Altace. Forwarded to Dr. Swaziland for review. Mylo Red RN

## 2011-05-23 DIAGNOSIS — Z96649 Presence of unspecified artificial hip joint: Secondary | ICD-10-CM | POA: Diagnosis not present

## 2011-05-23 DIAGNOSIS — R269 Unspecified abnormalities of gait and mobility: Secondary | ICD-10-CM | POA: Diagnosis not present

## 2011-05-23 DIAGNOSIS — I10 Essential (primary) hypertension: Secondary | ICD-10-CM | POA: Diagnosis not present

## 2011-05-23 DIAGNOSIS — Z471 Aftercare following joint replacement surgery: Secondary | ICD-10-CM | POA: Diagnosis not present

## 2011-05-23 DIAGNOSIS — M6281 Muscle weakness (generalized): Secondary | ICD-10-CM | POA: Diagnosis not present

## 2011-05-24 DIAGNOSIS — M6281 Muscle weakness (generalized): Secondary | ICD-10-CM | POA: Diagnosis not present

## 2011-05-24 DIAGNOSIS — R269 Unspecified abnormalities of gait and mobility: Secondary | ICD-10-CM | POA: Diagnosis not present

## 2011-05-24 DIAGNOSIS — Z96649 Presence of unspecified artificial hip joint: Secondary | ICD-10-CM | POA: Diagnosis not present

## 2011-05-24 DIAGNOSIS — I10 Essential (primary) hypertension: Secondary | ICD-10-CM | POA: Diagnosis not present

## 2011-05-24 DIAGNOSIS — Z471 Aftercare following joint replacement surgery: Secondary | ICD-10-CM | POA: Diagnosis not present

## 2011-05-27 DIAGNOSIS — M6281 Muscle weakness (generalized): Secondary | ICD-10-CM | POA: Diagnosis not present

## 2011-05-27 DIAGNOSIS — Z96649 Presence of unspecified artificial hip joint: Secondary | ICD-10-CM | POA: Diagnosis not present

## 2011-05-27 DIAGNOSIS — R269 Unspecified abnormalities of gait and mobility: Secondary | ICD-10-CM | POA: Diagnosis not present

## 2011-05-27 DIAGNOSIS — I10 Essential (primary) hypertension: Secondary | ICD-10-CM | POA: Diagnosis not present

## 2011-05-27 DIAGNOSIS — Z471 Aftercare following joint replacement surgery: Secondary | ICD-10-CM | POA: Diagnosis not present

## 2011-05-28 DIAGNOSIS — M1610 Unilateral primary osteoarthritis, unspecified hip: Secondary | ICD-10-CM | POA: Diagnosis not present

## 2011-05-28 NOTE — Discharge Summary (Signed)
Patient ID: Jeremy Sawyer MRN: [id number] DOB/AGE: [birth year] 77 y.o.  Admit date: 05/15/2011 Discharge date: 05/28/2011  Admission Diagnoses:  Active Problems:  Osteoarthritis of right hip  Anemia due to blood loss   Discharge Diagnoses:  Same  Past Medical History  Diagnosis Date  . Groin pain     Right  . CAD (coronary artery disease)   . Hypercholesterolemia     takes Lipitor daily  . Hypertension     takes Amlodipine,Ramipril,and Metoprolol daily  . Myocardial infarction   . Sleep apnea     uses CPAP;sleep study done > 64yrs ago  . Osteoarthritis     Right hip  . Arthritis   . Joint pain   . Joint swelling   . Bruises easily   . Colon polyps   . Impaired hearing     Surgeries: Procedure(s): TOTAL HIP ARTHROPLASTY on 05/15/2011   Consultants:    Discharged Condition: Improved  Hospital Course: Jeremy Sawyer is an 77 y.o. male who was admitted 05/15/2011 for operative treatment of<principal problem not specified>. Patient has severe unremitting pain that affects sleep, daily activities, and work/hobbies. After pre-op clearance the patient was taken to the operating room on 05/15/2011 and underwent  Procedure(s): TOTAL HIP ARTHROPLASTY.    Patient was given perioperative antibiotics:  Anti-infectives     Start     Dose/Rate Route Frequency Ordered Stop   05/15/11 0500   ceFAZolin (ANCEF) IVPB 2 g/50 mL premix        2 g 100 mL/hr over 30 Minutes Intravenous 60 min pre-op 05/14/11 1551 05/15/11 1240           Patient was given sequential compression devices, early ambulation, and chemoprophylaxis to prevent DVT.  Patient benefited maximally from hospital stay and there were no complications.    Recent vital signs: No data found.    Recent laboratory studies: No results found for this basename: WBC:2,HGB:2,HCT:2,PLT:2,NA:2,K:2,CL:2,CO2:2,BUN:2,CREATININE:2,GLUCOSE:2,PT:2,INR:2,CALCIUM,2: in the last 72 hours   Discharge Medications:     Medication List  As of 05/28/2011  1:31 PM   STOP taking these medications         aspirin 81 MG tablet         TAKE these medications         amLODipine 10 MG tablet   Commonly known as: NORVASC   Take 10 mg by mouth daily.      atorvastatin 40 MG tablet   Commonly known as: LIPITOR   Take 40 mg by mouth daily.      celecoxib 200 MG capsule   Commonly known as: CELEBREX   Take 200 mg by mouth 2 (two) times daily.      metoprolol succinate 100 MG 24 hr tablet   Commonly known as: TOPROL-XL   Take 100 mg by mouth daily. Take with or immediately following a meal.      OSTEO BI-FLEX ADV DOUBLE ST PO   Take 1 tablet by mouth 2 (two) times daily.      ramipril 10 MG capsule   Commonly known as: ALTACE   Take 10 mg by mouth daily.      warfarin 5 MG tablet   Commonly known as: COUMADIN   Take 1 tablet (5 mg total) by mouth daily.            Diagnostic Studies: Dg Chest 2 View  05/09/2011  *RADIOLOGY REPORT*  Clinical Data: Preoperative evaluation for total right hip arthroplasty.  Coronary artery disease  CHEST -  2 VIEW  Comparison: None.  Findings: Heart and mediastinal contours are within normal limits. The lung fields appear clear with no signs of focal infiltrate or congestive failure.  No pleural fluid or significant peribronchial cuffing is seen.  Mild bilateral apical pleural thickening is noted.  Bony structures demonstrate degenerative osteophytosis of the mid and lower thoracic spine and are otherwise intact.  The right costophrenic angle has been excluded from the lateral view and is not fully evaluated.  IMPRESSION: No worrisome focal or acute cardiopulmonary abnormality noted.  Original Report Authenticated By: Bertha Stakes, M.D.   Dg Pelvis Portable  05/15/2011  *RADIOLOGY REPORT*  Clinical Data: Postop right total hip replacement.  PORTABLE PELVIS  Comparison: None.  Findings: Changes of right hip replacement.  No hardware or bony complicating feature.   Overlying soft tissue air.  IMPRESSION: Right hip replacement.  No complicating feature.  Original Report Authenticated By: Cyndie Chime, M.D.   Dg Hip Portable 1 View Right  05/15/2011  *RADIOLOGY REPORT*  Clinical Data: Postop right total hip arthroplasty.  PORTABLE RIGHT HIP - 1 VIEW  Comparison: None.  Findings: Single cross-table lateral view of the right hip is submitted.  Image quality is degraded by body habitus.  Femoral stem appears well seated.  IMPRESSION: Image quality is degraded by body habitus.  Femoral stem appears well seated.  Original Report Authenticated By: Reyes Ivan, M.D.    Disposition: Home-Health Care Svc  Discharge Orders    Future Orders Please Complete By Expires   Diet - low sodium heart healthy      Increase activity slowly      Walker       May shower / Bathe      Driving Restrictions      Comments:   No driving for 2 weeks.   Change dressing (specify)      Comments:   Dressing change as needed.   Call MD for:  temperature >100.4      Call MD for:  severe uncontrolled pain      Call MD for:  redness, tenderness, or signs of infection (pain, swelling, redness, odor or green/yellow discharge around incision site)      Discharge instructions      Comments:   Weightbearing as tolerated.  Followup with Dr. Turner Daniels in 10-12 days.   Discharge patient            Signed: Hazle Nordmann. 05/28/2011, 1:31 PM

## 2011-05-29 ENCOUNTER — Telehealth: Payer: Self-pay | Admitting: Cardiology

## 2011-05-29 DIAGNOSIS — I10 Essential (primary) hypertension: Secondary | ICD-10-CM | POA: Diagnosis not present

## 2011-05-29 DIAGNOSIS — R269 Unspecified abnormalities of gait and mobility: Secondary | ICD-10-CM | POA: Diagnosis not present

## 2011-05-29 DIAGNOSIS — Z96649 Presence of unspecified artificial hip joint: Secondary | ICD-10-CM | POA: Diagnosis not present

## 2011-05-29 DIAGNOSIS — M6281 Muscle weakness (generalized): Secondary | ICD-10-CM | POA: Diagnosis not present

## 2011-05-29 DIAGNOSIS — Z471 Aftercare following joint replacement surgery: Secondary | ICD-10-CM | POA: Diagnosis not present

## 2011-05-29 NOTE — Telephone Encounter (Signed)
New Problem   Patient request return call at [phone] concerning medication instructions.

## 2011-05-29 NOTE — Telephone Encounter (Signed)
Patient called stated he had hip replacement 05/15/11.States while in hospital B/P dropped,106/55.States altace was stopped.Taking norvasc 10 mg daily,toprol 100 mg daily.States today B/P 116/70.Advised to continue and if B/P starts going up call back.

## 2011-05-30 DIAGNOSIS — M6281 Muscle weakness (generalized): Secondary | ICD-10-CM | POA: Diagnosis not present

## 2011-05-30 DIAGNOSIS — Z96649 Presence of unspecified artificial hip joint: Secondary | ICD-10-CM | POA: Diagnosis not present

## 2011-05-30 DIAGNOSIS — I10 Essential (primary) hypertension: Secondary | ICD-10-CM | POA: Diagnosis not present

## 2011-05-30 DIAGNOSIS — R269 Unspecified abnormalities of gait and mobility: Secondary | ICD-10-CM | POA: Diagnosis not present

## 2011-05-30 DIAGNOSIS — Z471 Aftercare following joint replacement surgery: Secondary | ICD-10-CM | POA: Diagnosis not present

## 2011-05-31 DIAGNOSIS — Z96649 Presence of unspecified artificial hip joint: Secondary | ICD-10-CM | POA: Diagnosis not present

## 2011-05-31 DIAGNOSIS — I10 Essential (primary) hypertension: Secondary | ICD-10-CM | POA: Diagnosis not present

## 2011-05-31 DIAGNOSIS — R269 Unspecified abnormalities of gait and mobility: Secondary | ICD-10-CM | POA: Diagnosis not present

## 2011-05-31 DIAGNOSIS — M6281 Muscle weakness (generalized): Secondary | ICD-10-CM | POA: Diagnosis not present

## 2011-05-31 DIAGNOSIS — Z471 Aftercare following joint replacement surgery: Secondary | ICD-10-CM | POA: Diagnosis not present

## 2011-06-01 DIAGNOSIS — I10 Essential (primary) hypertension: Secondary | ICD-10-CM | POA: Diagnosis not present

## 2011-06-01 DIAGNOSIS — Z96649 Presence of unspecified artificial hip joint: Secondary | ICD-10-CM | POA: Diagnosis not present

## 2011-06-01 DIAGNOSIS — Z471 Aftercare following joint replacement surgery: Secondary | ICD-10-CM | POA: Diagnosis not present

## 2011-06-01 DIAGNOSIS — M6281 Muscle weakness (generalized): Secondary | ICD-10-CM | POA: Diagnosis not present

## 2011-06-01 DIAGNOSIS — R269 Unspecified abnormalities of gait and mobility: Secondary | ICD-10-CM | POA: Diagnosis not present

## 2011-06-03 DIAGNOSIS — Z96649 Presence of unspecified artificial hip joint: Secondary | ICD-10-CM | POA: Diagnosis not present

## 2011-06-03 DIAGNOSIS — N390 Urinary tract infection, site not specified: Secondary | ICD-10-CM | POA: Diagnosis not present

## 2011-06-03 DIAGNOSIS — M6281 Muscle weakness (generalized): Secondary | ICD-10-CM | POA: Diagnosis not present

## 2011-06-03 DIAGNOSIS — Z471 Aftercare following joint replacement surgery: Secondary | ICD-10-CM | POA: Diagnosis not present

## 2011-06-03 DIAGNOSIS — I10 Essential (primary) hypertension: Secondary | ICD-10-CM | POA: Diagnosis not present

## 2011-06-03 DIAGNOSIS — R269 Unspecified abnormalities of gait and mobility: Secondary | ICD-10-CM | POA: Diagnosis not present

## 2011-06-04 DIAGNOSIS — Z96649 Presence of unspecified artificial hip joint: Secondary | ICD-10-CM | POA: Diagnosis not present

## 2011-06-04 DIAGNOSIS — R269 Unspecified abnormalities of gait and mobility: Secondary | ICD-10-CM | POA: Diagnosis not present

## 2011-06-04 DIAGNOSIS — I10 Essential (primary) hypertension: Secondary | ICD-10-CM | POA: Diagnosis not present

## 2011-06-04 DIAGNOSIS — Z471 Aftercare following joint replacement surgery: Secondary | ICD-10-CM | POA: Diagnosis not present

## 2011-06-04 DIAGNOSIS — M6281 Muscle weakness (generalized): Secondary | ICD-10-CM | POA: Diagnosis not present

## 2011-06-06 DIAGNOSIS — M6281 Muscle weakness (generalized): Secondary | ICD-10-CM | POA: Diagnosis not present

## 2011-06-06 DIAGNOSIS — R269 Unspecified abnormalities of gait and mobility: Secondary | ICD-10-CM | POA: Diagnosis not present

## 2011-06-06 DIAGNOSIS — Z96649 Presence of unspecified artificial hip joint: Secondary | ICD-10-CM | POA: Diagnosis not present

## 2011-06-06 DIAGNOSIS — Z471 Aftercare following joint replacement surgery: Secondary | ICD-10-CM | POA: Diagnosis not present

## 2011-06-06 DIAGNOSIS — I10 Essential (primary) hypertension: Secondary | ICD-10-CM | POA: Diagnosis not present

## 2011-06-07 DIAGNOSIS — M1610 Unilateral primary osteoarthritis, unspecified hip: Secondary | ICD-10-CM | POA: Diagnosis not present

## 2011-06-10 DIAGNOSIS — Z96649 Presence of unspecified artificial hip joint: Secondary | ICD-10-CM | POA: Diagnosis not present

## 2011-06-10 DIAGNOSIS — M1610 Unilateral primary osteoarthritis, unspecified hip: Secondary | ICD-10-CM | POA: Diagnosis not present

## 2011-06-17 DIAGNOSIS — R269 Unspecified abnormalities of gait and mobility: Secondary | ICD-10-CM | POA: Diagnosis not present

## 2011-06-17 DIAGNOSIS — M6281 Muscle weakness (generalized): Secondary | ICD-10-CM | POA: Diagnosis not present

## 2011-06-17 DIAGNOSIS — M1610 Unilateral primary osteoarthritis, unspecified hip: Secondary | ICD-10-CM | POA: Diagnosis not present

## 2011-06-17 DIAGNOSIS — E079 Disorder of thyroid, unspecified: Secondary | ICD-10-CM | POA: Diagnosis not present

## 2011-06-17 DIAGNOSIS — E785 Hyperlipidemia, unspecified: Secondary | ICD-10-CM | POA: Diagnosis not present

## 2011-06-17 DIAGNOSIS — Z471 Aftercare following joint replacement surgery: Secondary | ICD-10-CM | POA: Diagnosis not present

## 2011-06-17 DIAGNOSIS — I2510 Atherosclerotic heart disease of native coronary artery without angina pectoris: Secondary | ICD-10-CM | POA: Diagnosis not present

## 2011-06-17 DIAGNOSIS — Z96649 Presence of unspecified artificial hip joint: Secondary | ICD-10-CM | POA: Diagnosis not present

## 2011-06-17 DIAGNOSIS — I10 Essential (primary) hypertension: Secondary | ICD-10-CM | POA: Diagnosis not present

## 2011-06-20 DIAGNOSIS — M1610 Unilateral primary osteoarthritis, unspecified hip: Secondary | ICD-10-CM | POA: Diagnosis not present

## 2011-06-20 DIAGNOSIS — Z96649 Presence of unspecified artificial hip joint: Secondary | ICD-10-CM | POA: Diagnosis not present

## 2011-06-27 DIAGNOSIS — Z96649 Presence of unspecified artificial hip joint: Secondary | ICD-10-CM | POA: Diagnosis not present

## 2011-06-27 DIAGNOSIS — M1610 Unilateral primary osteoarthritis, unspecified hip: Secondary | ICD-10-CM | POA: Diagnosis not present

## 2011-07-01 DIAGNOSIS — Z96649 Presence of unspecified artificial hip joint: Secondary | ICD-10-CM | POA: Diagnosis not present

## 2011-07-01 DIAGNOSIS — M1610 Unilateral primary osteoarthritis, unspecified hip: Secondary | ICD-10-CM | POA: Diagnosis not present

## 2011-07-04 DIAGNOSIS — M1610 Unilateral primary osteoarthritis, unspecified hip: Secondary | ICD-10-CM | POA: Diagnosis not present

## 2011-07-04 DIAGNOSIS — Z96649 Presence of unspecified artificial hip joint: Secondary | ICD-10-CM | POA: Diagnosis not present

## 2011-07-08 DIAGNOSIS — Z96649 Presence of unspecified artificial hip joint: Secondary | ICD-10-CM | POA: Diagnosis not present

## 2011-07-08 DIAGNOSIS — M1610 Unilateral primary osteoarthritis, unspecified hip: Secondary | ICD-10-CM | POA: Diagnosis not present

## 2011-07-11 DIAGNOSIS — M1610 Unilateral primary osteoarthritis, unspecified hip: Secondary | ICD-10-CM | POA: Diagnosis not present

## 2011-07-11 DIAGNOSIS — M25559 Pain in unspecified hip: Secondary | ICD-10-CM | POA: Diagnosis not present

## 2011-07-16 DIAGNOSIS — M1610 Unilateral primary osteoarthritis, unspecified hip: Secondary | ICD-10-CM | POA: Diagnosis not present

## 2011-07-16 DIAGNOSIS — Z96649 Presence of unspecified artificial hip joint: Secondary | ICD-10-CM | POA: Diagnosis not present

## 2011-07-18 DIAGNOSIS — Z96649 Presence of unspecified artificial hip joint: Secondary | ICD-10-CM | POA: Diagnosis not present

## 2011-07-18 DIAGNOSIS — M1610 Unilateral primary osteoarthritis, unspecified hip: Secondary | ICD-10-CM | POA: Diagnosis not present

## 2011-07-22 DIAGNOSIS — M1610 Unilateral primary osteoarthritis, unspecified hip: Secondary | ICD-10-CM | POA: Diagnosis not present

## 2011-07-22 DIAGNOSIS — Z96649 Presence of unspecified artificial hip joint: Secondary | ICD-10-CM | POA: Diagnosis not present

## 2011-07-25 DIAGNOSIS — M1610 Unilateral primary osteoarthritis, unspecified hip: Secondary | ICD-10-CM | POA: Diagnosis not present

## 2011-07-25 DIAGNOSIS — Z96659 Presence of unspecified artificial knee joint: Secondary | ICD-10-CM | POA: Diagnosis not present

## 2011-07-29 DIAGNOSIS — Z96649 Presence of unspecified artificial hip joint: Secondary | ICD-10-CM | POA: Diagnosis not present

## 2011-07-29 DIAGNOSIS — M1610 Unilateral primary osteoarthritis, unspecified hip: Secondary | ICD-10-CM | POA: Diagnosis not present

## 2011-08-01 DIAGNOSIS — Z96649 Presence of unspecified artificial hip joint: Secondary | ICD-10-CM | POA: Diagnosis not present

## 2011-08-01 DIAGNOSIS — M1610 Unilateral primary osteoarthritis, unspecified hip: Secondary | ICD-10-CM | POA: Diagnosis not present

## 2011-08-05 DIAGNOSIS — Z96649 Presence of unspecified artificial hip joint: Secondary | ICD-10-CM | POA: Diagnosis not present

## 2011-08-05 DIAGNOSIS — M1610 Unilateral primary osteoarthritis, unspecified hip: Secondary | ICD-10-CM | POA: Diagnosis not present

## 2011-08-07 DIAGNOSIS — M1610 Unilateral primary osteoarthritis, unspecified hip: Secondary | ICD-10-CM | POA: Diagnosis not present

## 2011-08-07 DIAGNOSIS — Z96649 Presence of unspecified artificial hip joint: Secondary | ICD-10-CM | POA: Diagnosis not present

## 2011-08-13 DIAGNOSIS — Z96649 Presence of unspecified artificial hip joint: Secondary | ICD-10-CM | POA: Diagnosis not present

## 2011-08-13 DIAGNOSIS — M1610 Unilateral primary osteoarthritis, unspecified hip: Secondary | ICD-10-CM | POA: Diagnosis not present

## 2011-12-04 DIAGNOSIS — M169 Osteoarthritis of hip, unspecified: Secondary | ICD-10-CM | POA: Diagnosis not present

## 2011-12-13 DIAGNOSIS — I10 Essential (primary) hypertension: Secondary | ICD-10-CM | POA: Diagnosis not present

## 2011-12-13 DIAGNOSIS — E785 Hyperlipidemia, unspecified: Secondary | ICD-10-CM | POA: Diagnosis not present

## 2011-12-13 DIAGNOSIS — Z125 Encounter for screening for malignant neoplasm of prostate: Secondary | ICD-10-CM | POA: Diagnosis not present

## 2011-12-19 ENCOUNTER — Encounter: Payer: Self-pay | Admitting: Gastroenterology

## 2011-12-19 DIAGNOSIS — Z0000 Encounter for general adult medical examination without abnormal findings: Secondary | ICD-10-CM | POA: Diagnosis not present

## 2011-12-19 DIAGNOSIS — Z23 Encounter for immunization: Secondary | ICD-10-CM | POA: Diagnosis not present

## 2011-12-19 DIAGNOSIS — Z125 Encounter for screening for malignant neoplasm of prostate: Secondary | ICD-10-CM | POA: Diagnosis not present

## 2011-12-19 DIAGNOSIS — G4733 Obstructive sleep apnea (adult) (pediatric): Secondary | ICD-10-CM | POA: Diagnosis not present

## 2011-12-19 DIAGNOSIS — I2510 Atherosclerotic heart disease of native coronary artery without angina pectoris: Secondary | ICD-10-CM | POA: Diagnosis not present

## 2011-12-26 DIAGNOSIS — Z1212 Encounter for screening for malignant neoplasm of rectum: Secondary | ICD-10-CM | POA: Diagnosis not present

## 2011-12-31 LAB — HM COLONOSCOPY

## 2012-01-01 DIAGNOSIS — H02829 Cysts of unspecified eye, unspecified eyelid: Secondary | ICD-10-CM | POA: Diagnosis not present

## 2012-01-01 DIAGNOSIS — H00029 Hordeolum internum unspecified eye, unspecified eyelid: Secondary | ICD-10-CM | POA: Diagnosis not present

## 2012-01-01 DIAGNOSIS — H00019 Hordeolum externum unspecified eye, unspecified eyelid: Secondary | ICD-10-CM | POA: Diagnosis not present

## 2012-01-02 ENCOUNTER — Ambulatory Visit (AMBULATORY_SURGERY_CENTER): Payer: Medicare Other | Admitting: *Deleted

## 2012-01-02 VITALS — Ht 71.0 in | Wt 206.3 lb

## 2012-01-02 DIAGNOSIS — Z1211 Encounter for screening for malignant neoplasm of colon: Secondary | ICD-10-CM

## 2012-01-02 MED ORDER — MOVIPREP 100 G PO SOLR: ORAL | Status: DC

## 2012-01-15 ENCOUNTER — Ambulatory Visit (AMBULATORY_SURGERY_CENTER): Payer: Medicare Other | Admitting: Gastroenterology

## 2012-01-15 VITALS — BP 133/76 | HR 64 | Temp 97.9°F | Resp 21 | Ht 71.0 in | Wt 206.0 lb

## 2012-01-15 DIAGNOSIS — K5730 Diverticulosis of large intestine without perforation or abscess without bleeding: Secondary | ICD-10-CM

## 2012-01-15 DIAGNOSIS — D126 Benign neoplasm of colon, unspecified: Secondary | ICD-10-CM | POA: Diagnosis not present

## 2012-01-15 DIAGNOSIS — Z1211 Encounter for screening for malignant neoplasm of colon: Secondary | ICD-10-CM

## 2012-01-15 MED ORDER — SODIUM CHLORIDE 0.9 % IV SOLN: 500.0000 mL | INTRAVENOUS | Status: DC

## 2012-01-15 NOTE — Progress Notes (Signed)
Patient did not experience any of the following events: a burn prior to discharge; a fall within the facility; wrong site/side/patient/procedure/implant event; or a hospital transfer or hospital admission upon discharge from the facility. (G8907) Patient did not have preoperative order for IV antibiotic SSI prophylaxis. (G8918)  

## 2012-01-15 NOTE — Op Note (Signed)
[facility] [address] [place], [address]   COLONOSCOPY PROCEDURE REPORT  PATIENT: Jeremy Sawyer, Jeremy Sawyer.  MR#: [id number] BIRTHDATE: 1934/08/20 , 77  yrs. 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  MEDICATIONS: Propofol (Diprivan) 20 mg IV  DESCRIPTION OF PROCEDURE:   After the risks and benefits and of the procedure were explained, informed consent was obtained.  A digital rectal exam revealed no abnormalities of the rectum.    The LB [id number]  endoscope was introduced through the anus and advanced to the cecum, which was identified by both the appendix and ileocecal valve .  The quality of the prep was good, using MoviPrep .  The instrument was then slowly withdrawn as the Sawyer was fully examined.     Sawyer FINDINGS: Severe diverticulosis was noted in the descending Sawyer and sigmoid Sawyer.   Two smooth flat polyps ranging between 3-51mm in size were found in the descending Sawyer.  A polypectomy was performed using snare cautery.  The resection was complete and the polyp tissue was completely retrieved.   The Sawyer mucosa was otherwise normal.     Retroflexed views revealed no abnormalities. The scope was then withdrawn from the patient and the procedure completed.  COMPLICATIONS: There were no complications. ENDOSCOPIC IMPRESSION: 1.   Severe diverticulosis was noted in the descending Sawyer and sigmoid Sawyer 2.   Two flat polyps ranging between 3-1mm in size were found in the descending Sawyer; polypectomy was performed using snare cautery 3.   The Sawyer mucosa was otherwise normal  RECOMMENDATIONS: 1.  Repeat colonoscopy in 5 years if polyp adenomatous; otherwise 10 years 2.  continue current medications 3.  High fiber diet   REPEAT  EXAM:  cc:  _______________________________ eSignedMardella Layman, MD, [facility] 01/15/2012 10:35 AM     PATIENT NAME:  Jeremy Sawyer. MR#: [id number]

## 2012-01-15 NOTE — Patient Instructions (Addendum)

## 2012-01-16 ENCOUNTER — Telehealth: Payer: Self-pay | Admitting: *Deleted

## 2012-01-16 NOTE — Telephone Encounter (Signed)
  Follow up Call-  Call back number 01/15/2012  Post procedure Call Back phone  # [phone]  Permission to leave phone message Yes     Left message on answering machine to call us back if he is experiencing any problems or has questions

## 2012-01-20 ENCOUNTER — Encounter: Payer: Self-pay | Admitting: Gastroenterology

## 2012-01-20 NOTE — Addendum Note (Signed)
Addended by: Maple Hudson on: 01/20/2012 08:28 AM   Modules accepted: Level of Service

## 2012-05-05 DIAGNOSIS — M25559 Pain in unspecified hip: Secondary | ICD-10-CM | POA: Diagnosis not present

## 2012-07-22 DIAGNOSIS — M1710 Unilateral primary osteoarthritis, unspecified knee: Secondary | ICD-10-CM | POA: Diagnosis not present

## 2012-12-08 DIAGNOSIS — H00029 Hordeolum internum unspecified eye, unspecified eyelid: Secondary | ICD-10-CM | POA: Diagnosis not present

## 2012-12-14 DIAGNOSIS — I259 Chronic ischemic heart disease, unspecified: Secondary | ICD-10-CM | POA: Diagnosis not present

## 2012-12-14 DIAGNOSIS — E785 Hyperlipidemia, unspecified: Secondary | ICD-10-CM | POA: Diagnosis not present

## 2012-12-14 DIAGNOSIS — Z125 Encounter for screening for malignant neoplasm of prostate: Secondary | ICD-10-CM | POA: Diagnosis not present

## 2012-12-14 DIAGNOSIS — I10 Essential (primary) hypertension: Secondary | ICD-10-CM | POA: Diagnosis not present

## 2012-12-21 DIAGNOSIS — G4733 Obstructive sleep apnea (adult) (pediatric): Secondary | ICD-10-CM | POA: Diagnosis not present

## 2012-12-21 DIAGNOSIS — Z23 Encounter for immunization: Secondary | ICD-10-CM | POA: Diagnosis not present

## 2012-12-21 DIAGNOSIS — M1710 Unilateral primary osteoarthritis, unspecified knee: Secondary | ICD-10-CM | POA: Diagnosis not present

## 2012-12-21 DIAGNOSIS — I2510 Atherosclerotic heart disease of native coronary artery without angina pectoris: Secondary | ICD-10-CM | POA: Diagnosis not present

## 2012-12-21 DIAGNOSIS — E785 Hyperlipidemia, unspecified: Secondary | ICD-10-CM | POA: Diagnosis not present

## 2012-12-21 DIAGNOSIS — Z125 Encounter for screening for malignant neoplasm of prostate: Secondary | ICD-10-CM | POA: Diagnosis not present

## 2012-12-21 DIAGNOSIS — I252 Old myocardial infarction: Secondary | ICD-10-CM | POA: Diagnosis not present

## 2012-12-21 DIAGNOSIS — Z0000 Encounter for general adult medical examination without abnormal findings: Secondary | ICD-10-CM | POA: Diagnosis not present

## 2012-12-21 DIAGNOSIS — Z1331 Encounter for screening for depression: Secondary | ICD-10-CM | POA: Diagnosis not present

## 2012-12-21 DIAGNOSIS — M1990 Unspecified osteoarthritis, unspecified site: Secondary | ICD-10-CM | POA: Diagnosis not present

## 2012-12-22 DIAGNOSIS — Z1212 Encounter for screening for malignant neoplasm of rectum: Secondary | ICD-10-CM | POA: Diagnosis not present

## 2013-01-11 DIAGNOSIS — M67919 Unspecified disorder of synovium and tendon, unspecified shoulder: Secondary | ICD-10-CM | POA: Diagnosis not present

## 2013-07-26 DIAGNOSIS — Z23 Encounter for immunization: Secondary | ICD-10-CM | POA: Diagnosis not present

## 2013-07-26 DIAGNOSIS — I10 Essential (primary) hypertension: Secondary | ICD-10-CM | POA: Diagnosis not present

## 2013-07-26 DIAGNOSIS — M1990 Unspecified osteoarthritis, unspecified site: Secondary | ICD-10-CM | POA: Diagnosis not present

## 2013-07-26 DIAGNOSIS — E785 Hyperlipidemia, unspecified: Secondary | ICD-10-CM | POA: Diagnosis not present

## 2013-07-26 DIAGNOSIS — Z6830 Body mass index (BMI) 30.0-30.9, adult: Secondary | ICD-10-CM | POA: Diagnosis not present

## 2013-07-26 DIAGNOSIS — G4733 Obstructive sleep apnea (adult) (pediatric): Secondary | ICD-10-CM | POA: Diagnosis not present

## 2013-07-26 DIAGNOSIS — I2510 Atherosclerotic heart disease of native coronary artery without angina pectoris: Secondary | ICD-10-CM | POA: Diagnosis not present

## 2013-10-23 IMAGING — CR DG CHEST 2V
2 series · 2 of 2 positions shown · non-contrast
Comparison: None.

CLINICAL DATA: Preoperative evaluation for total right hip
arthroplasty.  Coronary artery disease

CHEST - 2 VIEW

[view not recorded (1 of 2)]
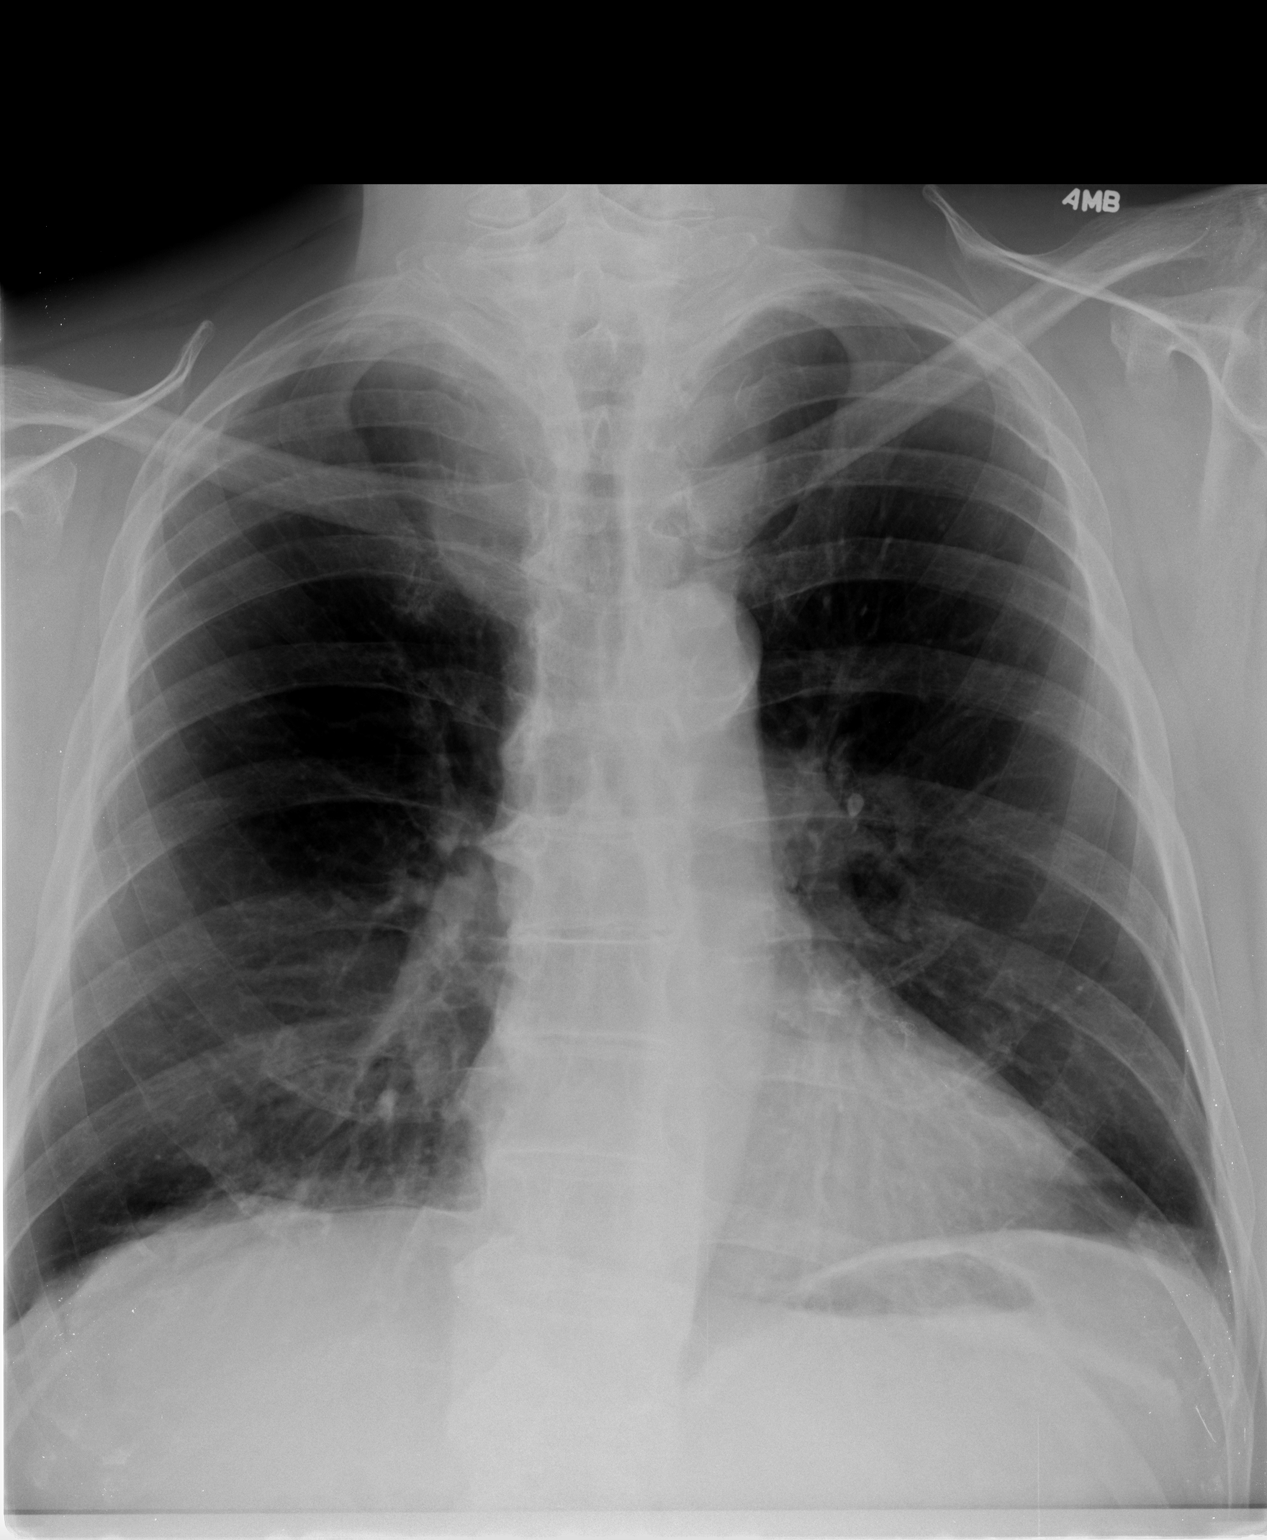

[view not recorded (2 of 2)]
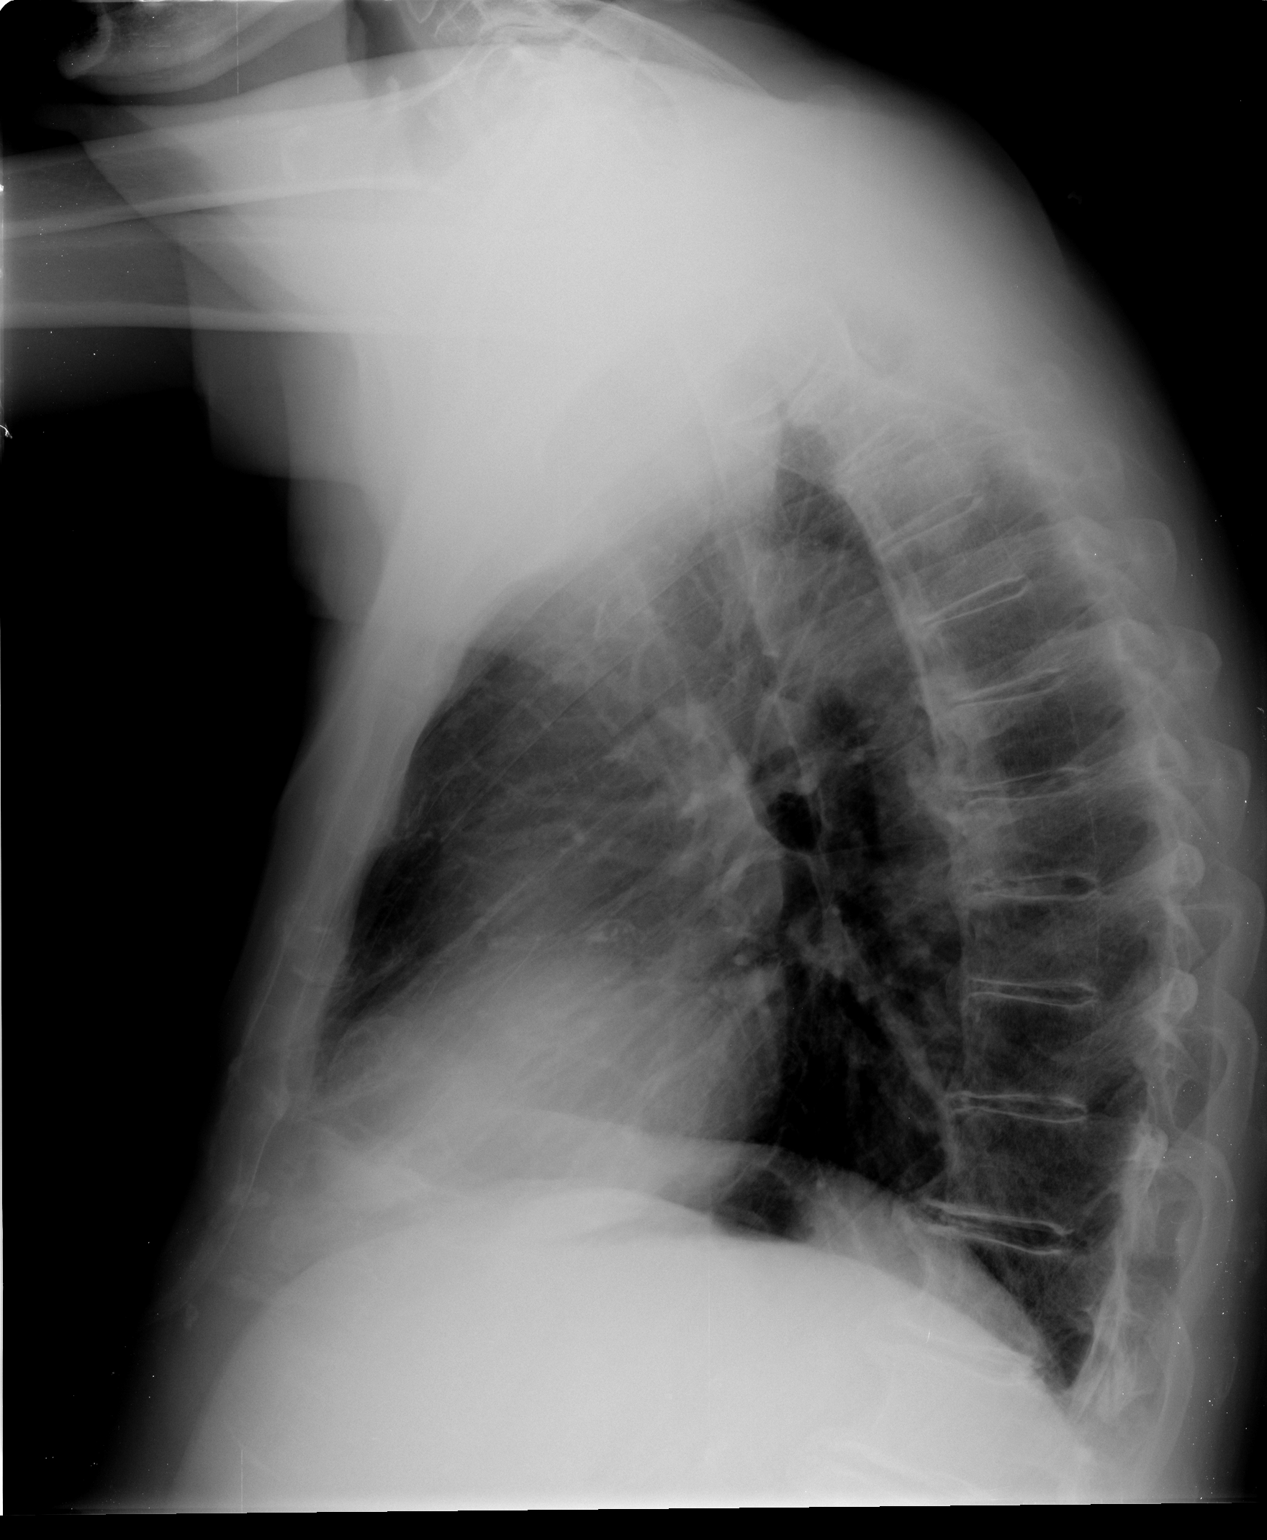

[2 of 2 positions shown; findings below may reference images not displayed]

FINDINGS: Heart and mediastinal contours are within normal limits.
The lung fields appear clear with no signs of focal infiltrate or
congestive failure.  No pleural fluid or significant peribronchial
cuffing is seen.

Mild bilateral apical pleural thickening is noted.

Bony structures demonstrate degenerative osteophytosis of the mid
and lower thoracic spine and are otherwise intact.

The right costophrenic angle has been excluded from the lateral
view and is not fully evaluated.
IMPRESSION: No worrisome focal or acute cardiopulmonary abnormality noted.

## 2013-10-29 IMAGING — CR DG PORTABLE PELVIS
2 series · 2 of 2 positions shown · non-contrast
Comparison: None.

CLINICAL DATA: Postop right total hip replacement.

PORTABLE PELVIS

[view not recorded (1 of 2)]
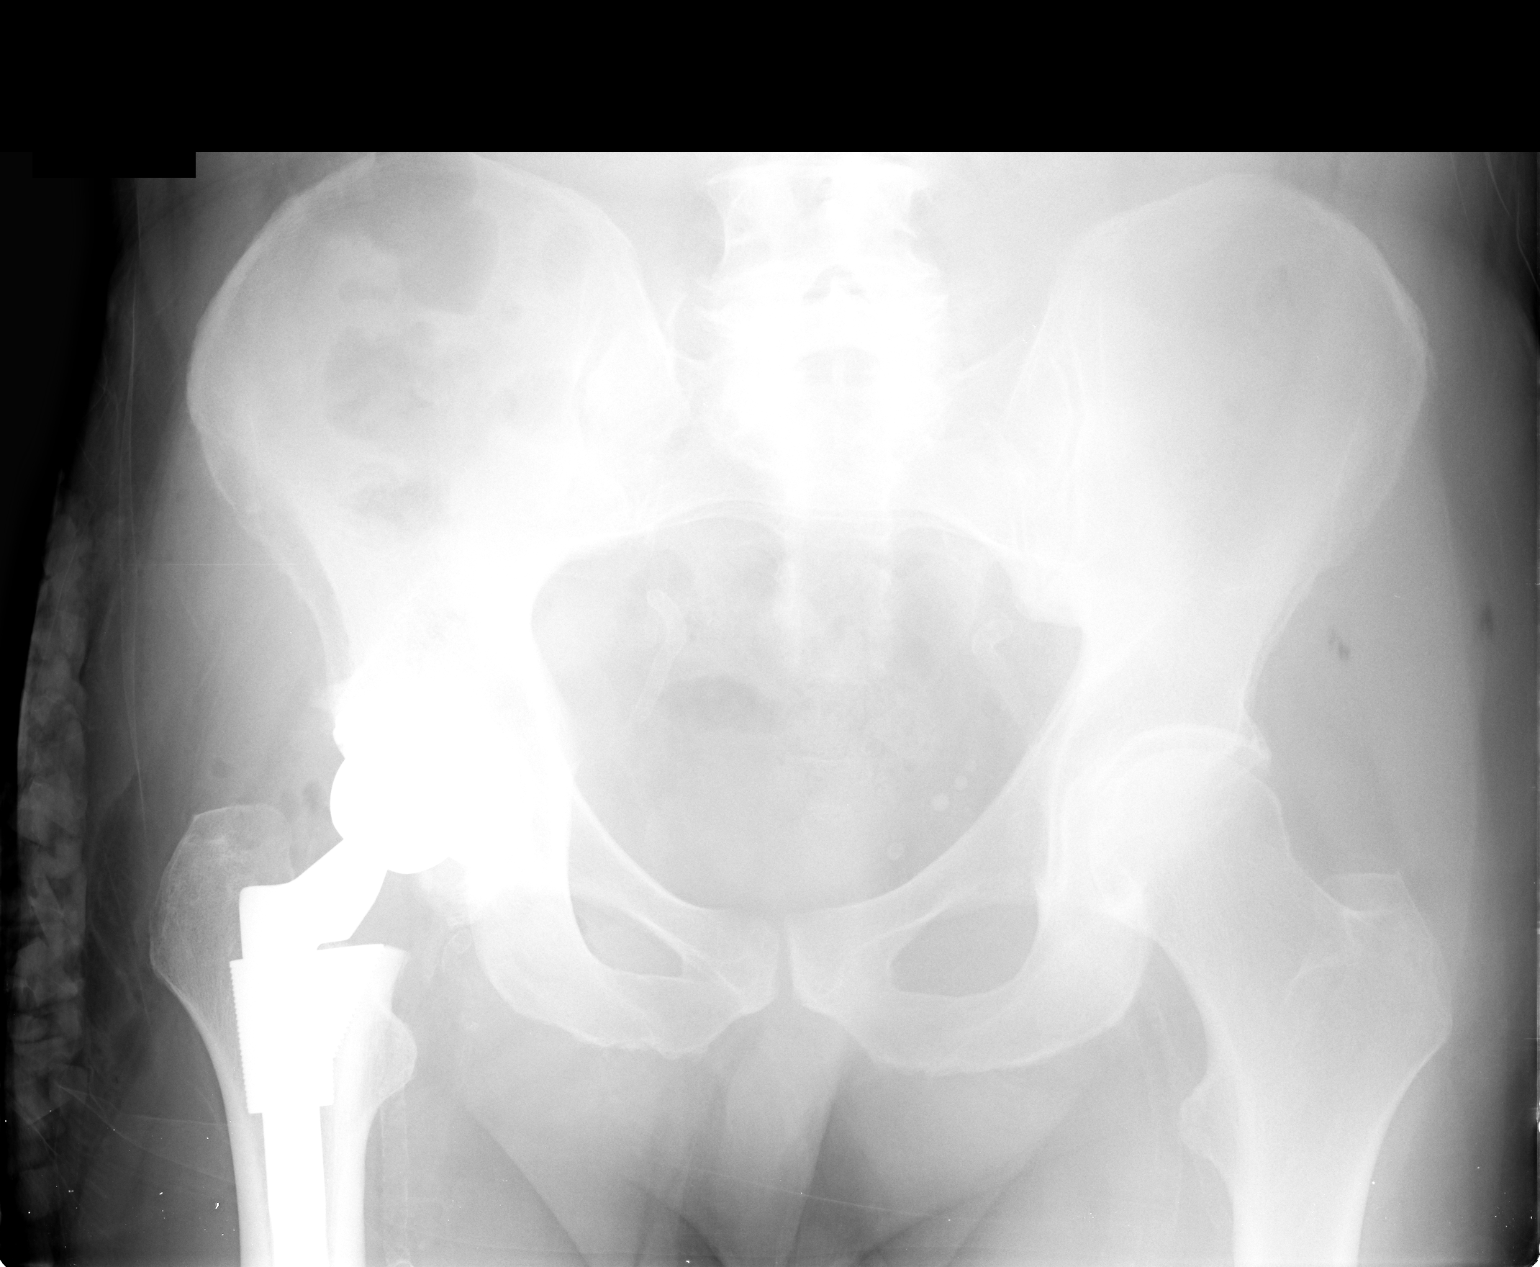

[view not recorded (2 of 2)]
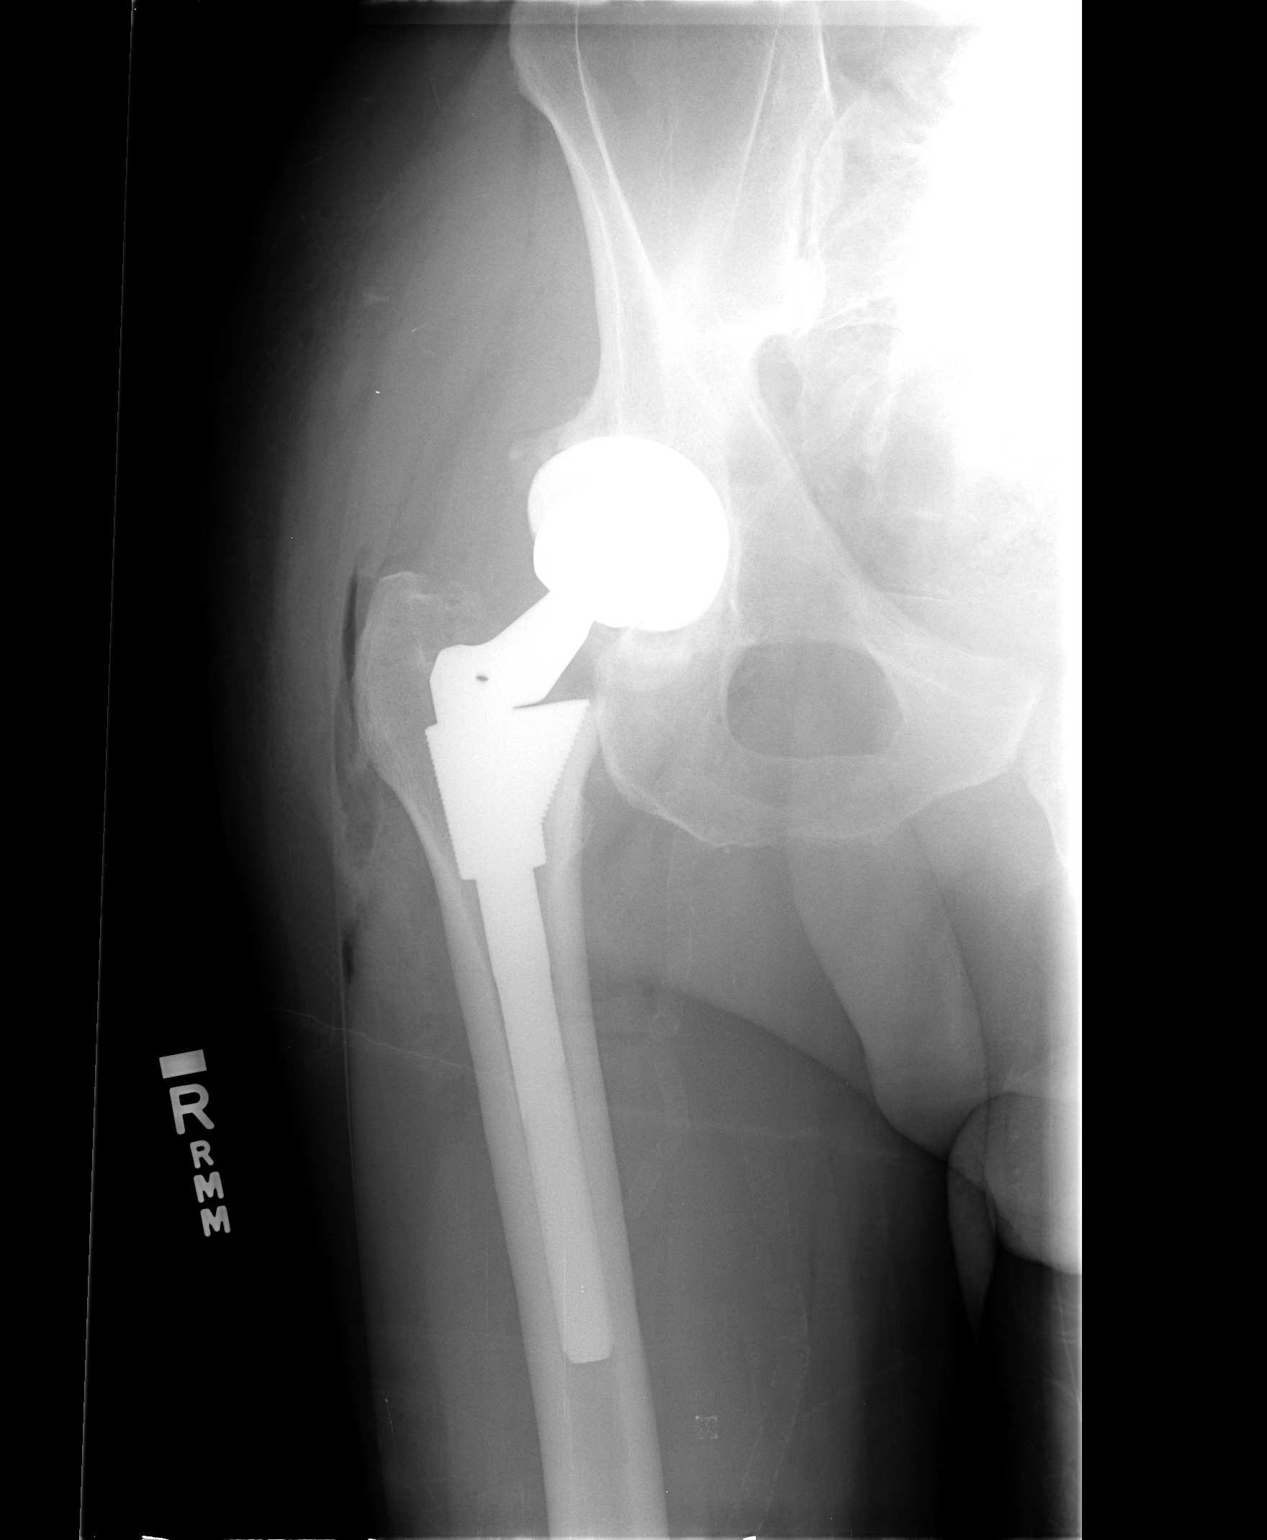

[2 of 2 positions shown; findings below may reference images not displayed]

FINDINGS: Changes of right hip replacement.  No hardware or bony
complicating feature.  Overlying soft tissue air.
IMPRESSION: Right hip replacement.  No complicating feature.

## 2013-10-29 IMAGING — CR DG HIP 1V PORT*R*
1 series · 1 of 1 positions shown · non-contrast
Comparison: None.

CLINICAL DATA: Postop right total hip arthroplasty.

PORTABLE RIGHT HIP - 1 VIEW

[view not recorded]
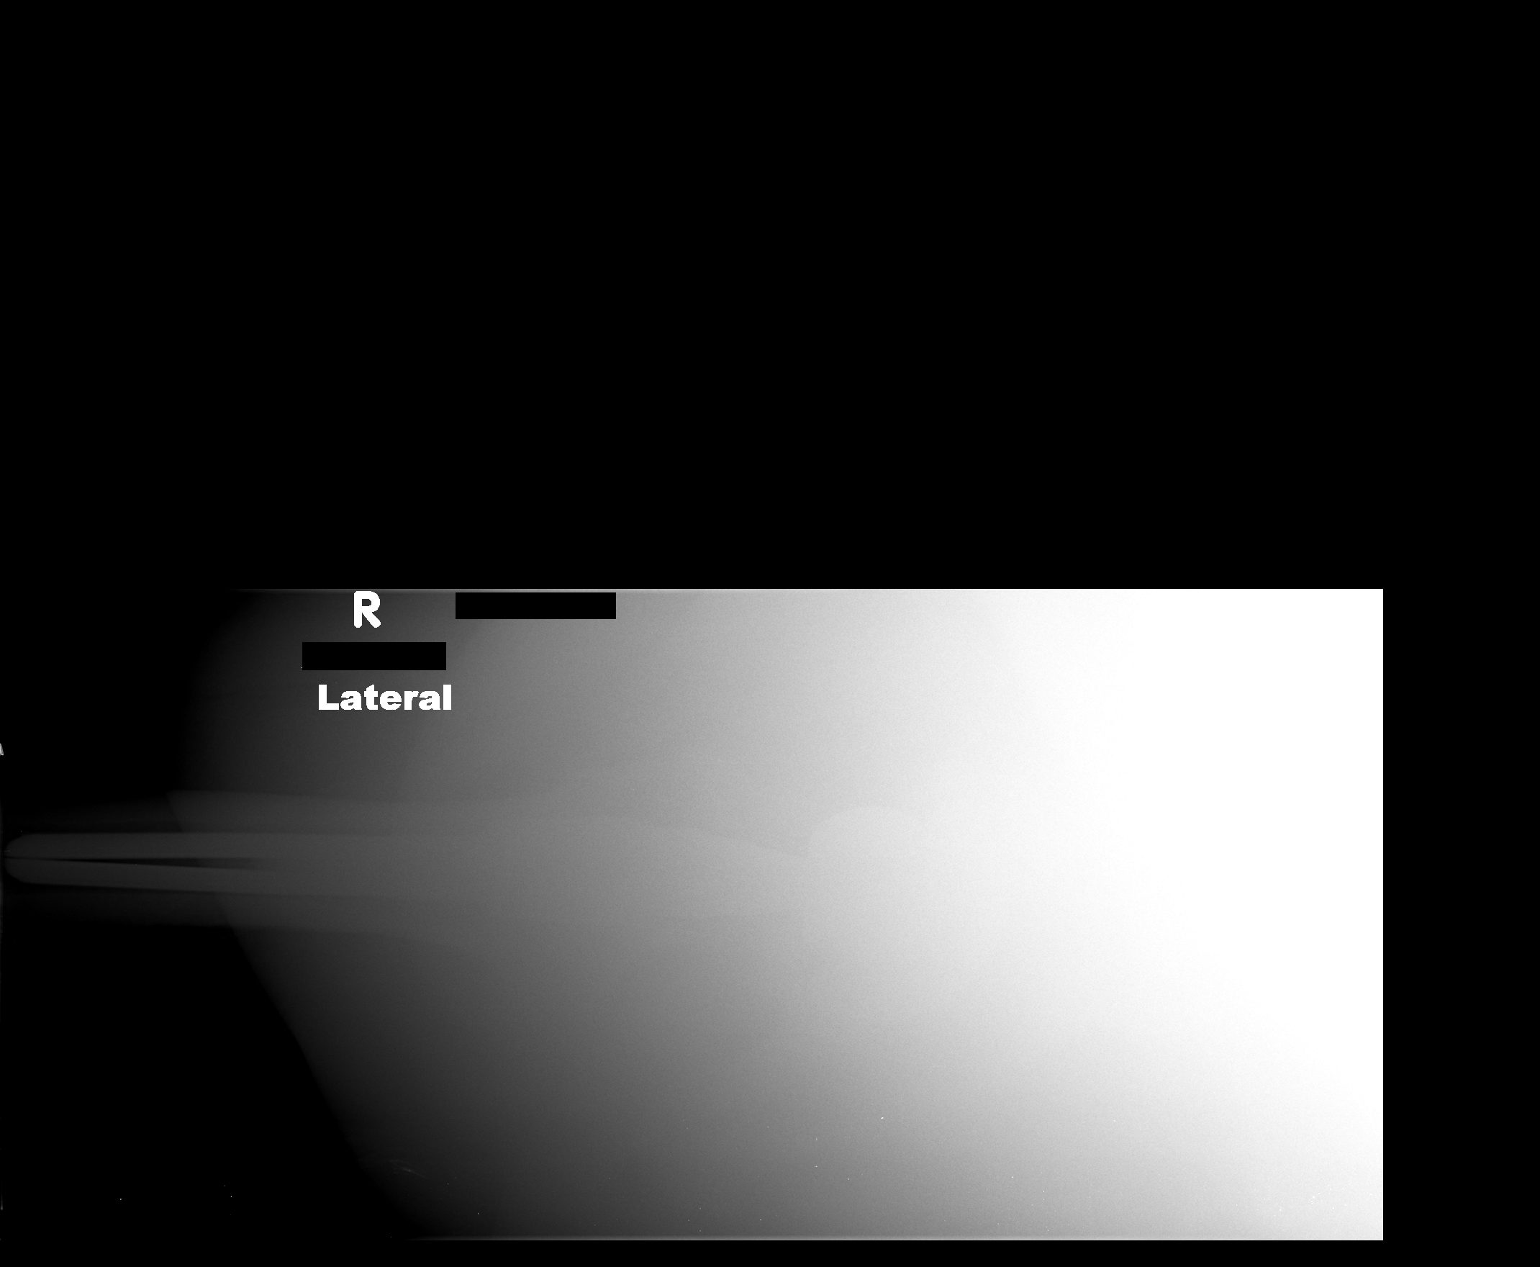

[1 of 1 positions shown; findings below may reference images not displayed]

FINDINGS: Single cross-table lateral view of the right hip is
submitted.  Image quality is degraded by body habitus.  Femoral
stem appears well seated.
IMPRESSION: Image quality is degraded by body habitus.  Femoral stem appears
well seated.

## 2013-11-11 DIAGNOSIS — H2589 Other age-related cataract: Secondary | ICD-10-CM | POA: Diagnosis not present

## 2013-11-11 DIAGNOSIS — H43399 Other vitreous opacities, unspecified eye: Secondary | ICD-10-CM | POA: Diagnosis not present

## 2013-12-20 DIAGNOSIS — Z23 Encounter for immunization: Secondary | ICD-10-CM | POA: Diagnosis not present

## 2014-01-24 DIAGNOSIS — R946 Abnormal results of thyroid function studies: Secondary | ICD-10-CM | POA: Diagnosis not present

## 2014-01-24 DIAGNOSIS — I10 Essential (primary) hypertension: Secondary | ICD-10-CM | POA: Diagnosis not present

## 2014-01-24 DIAGNOSIS — Z1389 Encounter for screening for other disorder: Secondary | ICD-10-CM | POA: Diagnosis not present

## 2014-01-24 DIAGNOSIS — M1990 Unspecified osteoarthritis, unspecified site: Secondary | ICD-10-CM | POA: Diagnosis not present

## 2014-01-24 DIAGNOSIS — G4733 Obstructive sleep apnea (adult) (pediatric): Secondary | ICD-10-CM | POA: Diagnosis not present

## 2014-01-24 DIAGNOSIS — I2510 Atherosclerotic heart disease of native coronary artery without angina pectoris: Secondary | ICD-10-CM | POA: Diagnosis not present

## 2014-01-24 DIAGNOSIS — E785 Hyperlipidemia, unspecified: Secondary | ICD-10-CM | POA: Diagnosis not present

## 2014-01-24 DIAGNOSIS — I252 Old myocardial infarction: Secondary | ICD-10-CM | POA: Diagnosis not present

## 2014-04-05 DIAGNOSIS — L600 Ingrowing nail: Secondary | ICD-10-CM | POA: Diagnosis not present

## 2014-08-09 DIAGNOSIS — I10 Essential (primary) hypertension: Secondary | ICD-10-CM | POA: Diagnosis not present

## 2014-08-09 DIAGNOSIS — Z125 Encounter for screening for malignant neoplasm of prostate: Secondary | ICD-10-CM | POA: Diagnosis not present

## 2014-08-09 DIAGNOSIS — I2510 Atherosclerotic heart disease of native coronary artery without angina pectoris: Secondary | ICD-10-CM | POA: Diagnosis not present

## 2014-08-09 DIAGNOSIS — E785 Hyperlipidemia, unspecified: Secondary | ICD-10-CM | POA: Diagnosis not present

## 2014-08-09 DIAGNOSIS — Z0000 Encounter for general adult medical examination without abnormal findings: Secondary | ICD-10-CM | POA: Diagnosis not present

## 2014-08-16 DIAGNOSIS — G4733 Obstructive sleep apnea (adult) (pediatric): Secondary | ICD-10-CM | POA: Diagnosis not present

## 2014-08-16 DIAGNOSIS — I252 Old myocardial infarction: Secondary | ICD-10-CM | POA: Diagnosis not present

## 2014-08-16 DIAGNOSIS — Z1389 Encounter for screening for other disorder: Secondary | ICD-10-CM | POA: Diagnosis not present

## 2014-08-16 DIAGNOSIS — I2510 Atherosclerotic heart disease of native coronary artery without angina pectoris: Secondary | ICD-10-CM | POA: Diagnosis not present

## 2014-08-16 DIAGNOSIS — K635 Polyp of colon: Secondary | ICD-10-CM | POA: Diagnosis not present

## 2014-08-16 DIAGNOSIS — M1990 Unspecified osteoarthritis, unspecified site: Secondary | ICD-10-CM | POA: Diagnosis not present

## 2014-08-16 DIAGNOSIS — N529 Male erectile dysfunction, unspecified: Secondary | ICD-10-CM | POA: Diagnosis not present

## 2014-08-16 DIAGNOSIS — Z0000 Encounter for general adult medical examination without abnormal findings: Secondary | ICD-10-CM | POA: Diagnosis not present

## 2014-08-16 DIAGNOSIS — I10 Essential (primary) hypertension: Secondary | ICD-10-CM | POA: Diagnosis not present

## 2014-08-16 DIAGNOSIS — Z6830 Body mass index (BMI) 30.0-30.9, adult: Secondary | ICD-10-CM | POA: Diagnosis not present

## 2014-08-16 DIAGNOSIS — M179 Osteoarthritis of knee, unspecified: Secondary | ICD-10-CM | POA: Diagnosis not present

## 2014-08-16 DIAGNOSIS — E785 Hyperlipidemia, unspecified: Secondary | ICD-10-CM | POA: Diagnosis not present

## 2014-08-19 DIAGNOSIS — Z1212 Encounter for screening for malignant neoplasm of rectum: Secondary | ICD-10-CM | POA: Diagnosis not present

## 2015-01-26 DIAGNOSIS — Z23 Encounter for immunization: Secondary | ICD-10-CM | POA: Diagnosis not present

## 2015-03-14 DIAGNOSIS — I119 Hypertensive heart disease without heart failure: Secondary | ICD-10-CM | POA: Diagnosis not present

## 2015-03-14 DIAGNOSIS — D692 Other nonthrombocytopenic purpura: Secondary | ICD-10-CM | POA: Diagnosis not present

## 2015-03-14 DIAGNOSIS — Z6830 Body mass index (BMI) 30.0-30.9, adult: Secondary | ICD-10-CM | POA: Diagnosis not present

## 2015-03-14 DIAGNOSIS — I2510 Atherosclerotic heart disease of native coronary artery without angina pectoris: Secondary | ICD-10-CM | POA: Diagnosis not present

## 2015-03-14 DIAGNOSIS — I10 Essential (primary) hypertension: Secondary | ICD-10-CM | POA: Diagnosis not present

## 2015-03-14 DIAGNOSIS — E784 Other hyperlipidemia: Secondary | ICD-10-CM | POA: Diagnosis not present

## 2015-03-14 DIAGNOSIS — G4733 Obstructive sleep apnea (adult) (pediatric): Secondary | ICD-10-CM | POA: Diagnosis not present

## 2015-08-14 DIAGNOSIS — E784 Other hyperlipidemia: Secondary | ICD-10-CM | POA: Diagnosis not present

## 2015-08-14 DIAGNOSIS — I10 Essential (primary) hypertension: Secondary | ICD-10-CM | POA: Diagnosis not present

## 2015-08-14 DIAGNOSIS — Z125 Encounter for screening for malignant neoplasm of prostate: Secondary | ICD-10-CM | POA: Diagnosis not present

## 2015-08-21 DIAGNOSIS — M1990 Unspecified osteoarthritis, unspecified site: Secondary | ICD-10-CM | POA: Diagnosis not present

## 2015-08-21 DIAGNOSIS — I119 Hypertensive heart disease without heart failure: Secondary | ICD-10-CM | POA: Diagnosis not present

## 2015-08-21 DIAGNOSIS — Z125 Encounter for screening for malignant neoplasm of prostate: Secondary | ICD-10-CM | POA: Diagnosis not present

## 2015-08-21 DIAGNOSIS — Z1389 Encounter for screening for other disorder: Secondary | ICD-10-CM | POA: Diagnosis not present

## 2015-08-21 DIAGNOSIS — Z0000 Encounter for general adult medical examination without abnormal findings: Secondary | ICD-10-CM | POA: Diagnosis not present

## 2015-08-21 DIAGNOSIS — E784 Other hyperlipidemia: Secondary | ICD-10-CM | POA: Diagnosis not present

## 2015-08-21 DIAGNOSIS — R946 Abnormal results of thyroid function studies: Secondary | ICD-10-CM | POA: Diagnosis not present

## 2015-08-21 DIAGNOSIS — Z6829 Body mass index (BMI) 29.0-29.9, adult: Secondary | ICD-10-CM | POA: Diagnosis not present

## 2015-08-21 DIAGNOSIS — I2510 Atherosclerotic heart disease of native coronary artery without angina pectoris: Secondary | ICD-10-CM | POA: Diagnosis not present

## 2015-09-20 DIAGNOSIS — M170 Bilateral primary osteoarthritis of knee: Secondary | ICD-10-CM | POA: Diagnosis not present

## 2015-09-20 DIAGNOSIS — M4120 Other idiopathic scoliosis, site unspecified: Secondary | ICD-10-CM | POA: Diagnosis not present

## 2015-12-06 DIAGNOSIS — M170 Bilateral primary osteoarthritis of knee: Secondary | ICD-10-CM | POA: Diagnosis not present

## 2015-12-14 DIAGNOSIS — M170 Bilateral primary osteoarthritis of knee: Secondary | ICD-10-CM | POA: Diagnosis not present

## 2015-12-20 DIAGNOSIS — M170 Bilateral primary osteoarthritis of knee: Secondary | ICD-10-CM | POA: Diagnosis not present

## 2015-12-21 DIAGNOSIS — Z23 Encounter for immunization: Secondary | ICD-10-CM | POA: Diagnosis not present

## 2016-01-04 DIAGNOSIS — M170 Bilateral primary osteoarthritis of knee: Secondary | ICD-10-CM | POA: Diagnosis not present

## 2016-01-11 DIAGNOSIS — M1712 Unilateral primary osteoarthritis, left knee: Secondary | ICD-10-CM | POA: Diagnosis not present

## 2016-02-19 DIAGNOSIS — E784 Other hyperlipidemia: Secondary | ICD-10-CM | POA: Diagnosis not present

## 2016-02-19 DIAGNOSIS — I2510 Atherosclerotic heart disease of native coronary artery without angina pectoris: Secondary | ICD-10-CM | POA: Diagnosis not present

## 2016-02-19 DIAGNOSIS — I10 Essential (primary) hypertension: Secondary | ICD-10-CM | POA: Diagnosis not present

## 2016-02-19 DIAGNOSIS — Z6829 Body mass index (BMI) 29.0-29.9, adult: Secondary | ICD-10-CM | POA: Diagnosis not present

## 2016-02-19 DIAGNOSIS — R946 Abnormal results of thyroid function studies: Secondary | ICD-10-CM | POA: Diagnosis not present

## 2016-02-19 DIAGNOSIS — M1990 Unspecified osteoarthritis, unspecified site: Secondary | ICD-10-CM | POA: Diagnosis not present

## 2016-02-19 DIAGNOSIS — G4733 Obstructive sleep apnea (adult) (pediatric): Secondary | ICD-10-CM | POA: Diagnosis not present

## 2016-02-19 DIAGNOSIS — D692 Other nonthrombocytopenic purpura: Secondary | ICD-10-CM | POA: Diagnosis not present

## 2016-02-29 DIAGNOSIS — M170 Bilateral primary osteoarthritis of knee: Secondary | ICD-10-CM | POA: Diagnosis not present

## 2016-03-27 DIAGNOSIS — R350 Frequency of micturition: Secondary | ICD-10-CM | POA: Diagnosis not present

## 2016-04-22 DIAGNOSIS — R338 Other retention of urine: Secondary | ICD-10-CM | POA: Diagnosis not present

## 2016-04-22 DIAGNOSIS — N401 Enlarged prostate with lower urinary tract symptoms: Secondary | ICD-10-CM | POA: Diagnosis not present

## 2016-05-13 DIAGNOSIS — N401 Enlarged prostate with lower urinary tract symptoms: Secondary | ICD-10-CM | POA: Diagnosis not present

## 2016-05-13 DIAGNOSIS — R338 Other retention of urine: Secondary | ICD-10-CM | POA: Diagnosis not present

## 2016-05-14 DIAGNOSIS — R338 Other retention of urine: Secondary | ICD-10-CM | POA: Diagnosis not present

## 2016-06-24 DIAGNOSIS — N401 Enlarged prostate with lower urinary tract symptoms: Secondary | ICD-10-CM | POA: Diagnosis not present

## 2016-06-24 DIAGNOSIS — R338 Other retention of urine: Secondary | ICD-10-CM | POA: Diagnosis not present

## 2016-07-04 DIAGNOSIS — M170 Bilateral primary osteoarthritis of knee: Secondary | ICD-10-CM | POA: Diagnosis not present

## 2016-09-02 DIAGNOSIS — H9190 Unspecified hearing loss, unspecified ear: Secondary | ICD-10-CM | POA: Diagnosis not present

## 2016-09-02 DIAGNOSIS — R350 Frequency of micturition: Secondary | ICD-10-CM | POA: Diagnosis not present

## 2016-09-02 DIAGNOSIS — I10 Essential (primary) hypertension: Secondary | ICD-10-CM | POA: Diagnosis not present

## 2016-09-02 DIAGNOSIS — Z6830 Body mass index (BMI) 30.0-30.9, adult: Secondary | ICD-10-CM | POA: Diagnosis not present

## 2016-09-02 DIAGNOSIS — D692 Other nonthrombocytopenic purpura: Secondary | ICD-10-CM | POA: Diagnosis not present

## 2016-09-02 DIAGNOSIS — I119 Hypertensive heart disease without heart failure: Secondary | ICD-10-CM | POA: Diagnosis not present

## 2016-09-02 DIAGNOSIS — I2510 Atherosclerotic heart disease of native coronary artery without angina pectoris: Secondary | ICD-10-CM | POA: Diagnosis not present

## 2016-09-02 DIAGNOSIS — M1990 Unspecified osteoarthritis, unspecified site: Secondary | ICD-10-CM | POA: Diagnosis not present

## 2016-09-03 DIAGNOSIS — R946 Abnormal results of thyroid function studies: Secondary | ICD-10-CM | POA: Diagnosis not present

## 2016-09-23 DIAGNOSIS — R350 Frequency of micturition: Secondary | ICD-10-CM | POA: Diagnosis not present

## 2016-09-23 DIAGNOSIS — N138 Other obstructive and reflux uropathy: Secondary | ICD-10-CM | POA: Diagnosis not present

## 2016-09-23 DIAGNOSIS — N401 Enlarged prostate with lower urinary tract symptoms: Secondary | ICD-10-CM | POA: Diagnosis not present

## 2016-09-23 DIAGNOSIS — R338 Other retention of urine: Secondary | ICD-10-CM | POA: Diagnosis not present

## 2016-10-10 DIAGNOSIS — M25551 Pain in right hip: Secondary | ICD-10-CM | POA: Diagnosis not present

## 2016-10-15 DIAGNOSIS — M5431 Sciatica, right side: Secondary | ICD-10-CM | POA: Diagnosis not present

## 2016-10-17 DIAGNOSIS — M5431 Sciatica, right side: Secondary | ICD-10-CM | POA: Diagnosis not present

## 2016-10-22 DIAGNOSIS — M5431 Sciatica, right side: Secondary | ICD-10-CM | POA: Diagnosis not present

## 2016-10-24 DIAGNOSIS — M5431 Sciatica, right side: Secondary | ICD-10-CM | POA: Diagnosis not present

## 2016-10-28 DIAGNOSIS — M5431 Sciatica, right side: Secondary | ICD-10-CM | POA: Diagnosis not present

## 2016-10-31 DIAGNOSIS — M5431 Sciatica, right side: Secondary | ICD-10-CM | POA: Diagnosis not present

## 2016-11-05 DIAGNOSIS — M5431 Sciatica, right side: Secondary | ICD-10-CM | POA: Diagnosis not present

## 2016-11-07 DIAGNOSIS — M5431 Sciatica, right side: Secondary | ICD-10-CM | POA: Diagnosis not present

## 2016-11-11 DIAGNOSIS — M5431 Sciatica, right side: Secondary | ICD-10-CM | POA: Diagnosis not present

## 2016-11-14 DIAGNOSIS — M5431 Sciatica, right side: Secondary | ICD-10-CM | POA: Diagnosis not present

## 2016-11-19 DIAGNOSIS — M25551 Pain in right hip: Secondary | ICD-10-CM | POA: Diagnosis not present

## 2016-12-18 ENCOUNTER — Encounter: Payer: Self-pay | Admitting: Gastroenterology

## 2016-12-24 DIAGNOSIS — M170 Bilateral primary osteoarthritis of knee: Secondary | ICD-10-CM | POA: Diagnosis not present

## 2016-12-26 DIAGNOSIS — Z23 Encounter for immunization: Secondary | ICD-10-CM | POA: Diagnosis not present

## 2017-01-28 DIAGNOSIS — R310 Gross hematuria: Secondary | ICD-10-CM | POA: Diagnosis not present

## 2017-02-13 DIAGNOSIS — Z125 Encounter for screening for malignant neoplasm of prostate: Secondary | ICD-10-CM | POA: Diagnosis not present

## 2017-02-13 DIAGNOSIS — I10 Essential (primary) hypertension: Secondary | ICD-10-CM | POA: Diagnosis not present

## 2017-02-13 DIAGNOSIS — R82998 Other abnormal findings in urine: Secondary | ICD-10-CM | POA: Diagnosis not present

## 2017-02-13 DIAGNOSIS — E7849 Other hyperlipidemia: Secondary | ICD-10-CM | POA: Diagnosis not present

## 2017-04-28 DIAGNOSIS — G4733 Obstructive sleep apnea (adult) (pediatric): Secondary | ICD-10-CM | POA: Diagnosis not present

## 2017-04-28 DIAGNOSIS — Z6830 Body mass index (BMI) 30.0-30.9, adult: Secondary | ICD-10-CM | POA: Diagnosis not present

## 2017-04-28 DIAGNOSIS — Z1389 Encounter for screening for other disorder: Secondary | ICD-10-CM | POA: Diagnosis not present

## 2017-04-28 DIAGNOSIS — D692 Other nonthrombocytopenic purpura: Secondary | ICD-10-CM | POA: Diagnosis not present

## 2017-04-28 DIAGNOSIS — D696 Thrombocytopenia, unspecified: Secondary | ICD-10-CM | POA: Diagnosis not present

## 2017-04-28 DIAGNOSIS — Z0000 Encounter for general adult medical examination without abnormal findings: Secondary | ICD-10-CM | POA: Diagnosis not present

## 2017-04-28 DIAGNOSIS — G2581 Restless legs syndrome: Secondary | ICD-10-CM | POA: Diagnosis not present

## 2017-04-28 DIAGNOSIS — E7849 Other hyperlipidemia: Secondary | ICD-10-CM | POA: Diagnosis not present

## 2017-04-28 DIAGNOSIS — I2510 Atherosclerotic heart disease of native coronary artery without angina pectoris: Secondary | ICD-10-CM | POA: Diagnosis not present

## 2017-04-28 DIAGNOSIS — I878 Other specified disorders of veins: Secondary | ICD-10-CM | POA: Diagnosis not present

## 2017-04-28 DIAGNOSIS — R808 Other proteinuria: Secondary | ICD-10-CM | POA: Diagnosis not present

## 2017-04-28 DIAGNOSIS — I119 Hypertensive heart disease without heart failure: Secondary | ICD-10-CM | POA: Diagnosis not present

## 2017-04-30 DIAGNOSIS — M170 Bilateral primary osteoarthritis of knee: Secondary | ICD-10-CM | POA: Diagnosis not present

## 2017-05-02 DIAGNOSIS — Z1212 Encounter for screening for malignant neoplasm of rectum: Secondary | ICD-10-CM | POA: Diagnosis not present

## 2017-07-31 DIAGNOSIS — M7061 Trochanteric bursitis, right hip: Secondary | ICD-10-CM | POA: Diagnosis not present

## 2017-07-31 DIAGNOSIS — M7062 Trochanteric bursitis, left hip: Secondary | ICD-10-CM | POA: Diagnosis not present

## 2017-09-08 DIAGNOSIS — N401 Enlarged prostate with lower urinary tract symptoms: Secondary | ICD-10-CM | POA: Diagnosis not present

## 2017-09-08 DIAGNOSIS — R350 Frequency of micturition: Secondary | ICD-10-CM | POA: Diagnosis not present

## 2017-10-09 DIAGNOSIS — M1612 Unilateral primary osteoarthritis, left hip: Secondary | ICD-10-CM | POA: Diagnosis not present

## 2017-10-22 DIAGNOSIS — H2513 Age-related nuclear cataract, bilateral: Secondary | ICD-10-CM | POA: Diagnosis not present

## 2017-10-22 DIAGNOSIS — H524 Presbyopia: Secondary | ICD-10-CM | POA: Diagnosis not present

## 2017-10-22 DIAGNOSIS — H52223 Regular astigmatism, bilateral: Secondary | ICD-10-CM | POA: Diagnosis not present

## 2017-10-22 DIAGNOSIS — H43393 Other vitreous opacities, bilateral: Secondary | ICD-10-CM | POA: Diagnosis not present

## 2017-10-22 DIAGNOSIS — H5203 Hypermetropia, bilateral: Secondary | ICD-10-CM | POA: Diagnosis not present

## 2017-10-27 DIAGNOSIS — M1612 Unilateral primary osteoarthritis, left hip: Secondary | ICD-10-CM | POA: Diagnosis not present

## 2017-10-27 DIAGNOSIS — M25552 Pain in left hip: Secondary | ICD-10-CM | POA: Diagnosis not present

## 2017-11-13 DIAGNOSIS — M170 Bilateral primary osteoarthritis of knee: Secondary | ICD-10-CM | POA: Diagnosis not present

## 2017-11-21 DIAGNOSIS — Z23 Encounter for immunization: Secondary | ICD-10-CM | POA: Diagnosis not present

## 2017-12-11 DIAGNOSIS — I10 Essential (primary) hypertension: Secondary | ICD-10-CM | POA: Diagnosis not present

## 2017-12-11 DIAGNOSIS — D692 Other nonthrombocytopenic purpura: Secondary | ICD-10-CM | POA: Diagnosis not present

## 2017-12-11 DIAGNOSIS — I2510 Atherosclerotic heart disease of native coronary artery without angina pectoris: Secondary | ICD-10-CM | POA: Diagnosis not present

## 2017-12-11 DIAGNOSIS — Z6830 Body mass index (BMI) 30.0-30.9, adult: Secondary | ICD-10-CM | POA: Diagnosis not present

## 2017-12-11 DIAGNOSIS — I878 Other specified disorders of veins: Secondary | ICD-10-CM | POA: Diagnosis not present

## 2017-12-11 DIAGNOSIS — E7849 Other hyperlipidemia: Secondary | ICD-10-CM | POA: Diagnosis not present

## 2017-12-11 DIAGNOSIS — D696 Thrombocytopenia, unspecified: Secondary | ICD-10-CM | POA: Diagnosis not present

## 2017-12-11 DIAGNOSIS — M1990 Unspecified osteoarthritis, unspecified site: Secondary | ICD-10-CM | POA: Diagnosis not present

## 2017-12-11 DIAGNOSIS — I119 Hypertensive heart disease without heart failure: Secondary | ICD-10-CM | POA: Diagnosis not present

## 2018-01-13 DIAGNOSIS — M1612 Unilateral primary osteoarthritis, left hip: Secondary | ICD-10-CM | POA: Diagnosis not present

## 2018-01-13 DIAGNOSIS — M170 Bilateral primary osteoarthritis of knee: Secondary | ICD-10-CM | POA: Diagnosis not present

## 2018-01-28 DIAGNOSIS — M1612 Unilateral primary osteoarthritis, left hip: Secondary | ICD-10-CM | POA: Diagnosis not present

## 2018-02-23 DIAGNOSIS — M1712 Unilateral primary osteoarthritis, left knee: Secondary | ICD-10-CM | POA: Diagnosis not present

## 2018-02-23 DIAGNOSIS — M1612 Unilateral primary osteoarthritis, left hip: Secondary | ICD-10-CM | POA: Diagnosis not present

## 2018-03-07 ENCOUNTER — Emergency Department (HOSPITAL_COMMUNITY): Payer: Medicare Other

## 2018-03-07 ENCOUNTER — Inpatient Hospital Stay (HOSPITAL_COMMUNITY)
Admission: EM | Admit: 2018-03-07 | Discharge: 2018-03-13 | DRG: 470 | Disposition: A | Discharge status: Skilled Nursing Facility | Payer: Medicare Other | Attending: Internal Medicine | Admitting: Internal Medicine

## 2018-03-07 ENCOUNTER — Other Ambulatory Visit: Payer: Self-pay

## 2018-03-07 DIAGNOSIS — Z8249 Family history of ischemic heart disease and other diseases of the circulatory system: Secondary | ICD-10-CM

## 2018-03-07 DIAGNOSIS — Z79899 Other long term (current) drug therapy: Secondary | ICD-10-CM

## 2018-03-07 DIAGNOSIS — G4730 Sleep apnea, unspecified: Secondary | ICD-10-CM | POA: Diagnosis present

## 2018-03-07 DIAGNOSIS — S72002A Fracture of unspecified part of neck of left femur, initial encounter for closed fracture: Secondary | ICD-10-CM | POA: Diagnosis present

## 2018-03-07 DIAGNOSIS — Z87891 Personal history of nicotine dependence: Secondary | ICD-10-CM | POA: Diagnosis not present

## 2018-03-07 DIAGNOSIS — R609 Edema, unspecified: Secondary | ICD-10-CM | POA: Diagnosis not present

## 2018-03-07 DIAGNOSIS — Z7982 Long term (current) use of aspirin: Secondary | ICD-10-CM

## 2018-03-07 DIAGNOSIS — M25462 Effusion, left knee: Secondary | ICD-10-CM | POA: Diagnosis present

## 2018-03-07 DIAGNOSIS — Z955 Presence of coronary angioplasty implant and graft: Secondary | ICD-10-CM

## 2018-03-07 DIAGNOSIS — Z419 Encounter for procedure for purposes other than remedying health state, unspecified: Secondary | ICD-10-CM | POA: Diagnosis not present

## 2018-03-07 DIAGNOSIS — Z7401 Bed confinement status: Secondary | ICD-10-CM | POA: Diagnosis not present

## 2018-03-07 DIAGNOSIS — E785 Hyperlipidemia, unspecified: Secondary | ICD-10-CM | POA: Diagnosis present

## 2018-03-07 DIAGNOSIS — I252 Old myocardial infarction: Secondary | ICD-10-CM

## 2018-03-07 DIAGNOSIS — H9190 Unspecified hearing loss, unspecified ear: Secondary | ICD-10-CM | POA: Diagnosis present

## 2018-03-07 DIAGNOSIS — Z8719 Personal history of other diseases of the digestive system: Secondary | ICD-10-CM | POA: Diagnosis not present

## 2018-03-07 DIAGNOSIS — S72042A Displaced fracture of base of neck of left femur, initial encounter for closed fracture: Secondary | ICD-10-CM | POA: Diagnosis not present

## 2018-03-07 DIAGNOSIS — N401 Enlarged prostate with lower urinary tract symptoms: Secondary | ICD-10-CM | POA: Diagnosis present

## 2018-03-07 DIAGNOSIS — R338 Other retention of urine: Secondary | ICD-10-CM | POA: Diagnosis present

## 2018-03-07 DIAGNOSIS — Z800 Family history of malignant neoplasm of digestive organs: Secondary | ICD-10-CM

## 2018-03-07 DIAGNOSIS — R3914 Feeling of incomplete bladder emptying: Secondary | ICD-10-CM | POA: Diagnosis not present

## 2018-03-07 DIAGNOSIS — I10 Essential (primary) hypertension: Secondary | ICD-10-CM | POA: Diagnosis present

## 2018-03-07 DIAGNOSIS — N400 Enlarged prostate without lower urinary tract symptoms: Secondary | ICD-10-CM | POA: Diagnosis present

## 2018-03-07 DIAGNOSIS — T148XXA Other injury of unspecified body region, initial encounter: Secondary | ICD-10-CM

## 2018-03-07 DIAGNOSIS — E7800 Pure hypercholesterolemia, unspecified: Secondary | ICD-10-CM | POA: Diagnosis not present

## 2018-03-07 DIAGNOSIS — Z96641 Presence of right artificial hip joint: Secondary | ICD-10-CM | POA: Diagnosis present

## 2018-03-07 DIAGNOSIS — W19XXXA Unspecified fall, initial encounter: Secondary | ICD-10-CM | POA: Diagnosis not present

## 2018-03-07 DIAGNOSIS — I2510 Atherosclerotic heart disease of native coronary artery without angina pectoris: Secondary | ICD-10-CM | POA: Diagnosis present

## 2018-03-07 DIAGNOSIS — R52 Pain, unspecified: Secondary | ICD-10-CM | POA: Diagnosis not present

## 2018-03-07 DIAGNOSIS — M2550 Pain in unspecified joint: Secondary | ICD-10-CM | POA: Diagnosis not present

## 2018-03-07 DIAGNOSIS — S20211A Contusion of right front wall of thorax, initial encounter: Secondary | ICD-10-CM | POA: Diagnosis not present

## 2018-03-07 LAB — CBC WITH DIFFERENTIAL/PLATELET
ABS IMMATURE GRANULOCYTES: 0.1 10*3/uL — AB (ref 0.00–0.07)
BASOS ABS: 0 10*3/uL (ref 0.0–0.1)
Basophils Relative: 0 %
Eosinophils Absolute: 0.1 10*3/uL (ref 0.0–0.5)
Eosinophils Relative: 0 %
HCT: 41.6 % (ref 39.0–52.0)
HEMOGLOBIN: 13.4 g/dL (ref 13.0–17.0)
Immature Granulocytes: 1 %
LYMPHS PCT: 7 %
Lymphs Abs: 0.9 10*3/uL (ref 0.7–4.0)
MCH: 30.8 pg (ref 26.0–34.0)
MCHC: 32.2 g/dL (ref 30.0–36.0)
MCV: 95.6 fL (ref 80.0–100.0)
Monocytes Absolute: 1.2 10*3/uL — ABNORMAL HIGH (ref 0.1–1.0)
Monocytes Relative: 9 %
NEUTROS ABS: 11.8 10*3/uL — AB (ref 1.7–7.7)
NEUTROS PCT: 83 %
NRBC: 0 % (ref 0.0–0.2)
Platelets: 189 10*3/uL (ref 150–400)
RBC: 4.35 MIL/uL (ref 4.22–5.81)
RDW: 13 % (ref 11.5–15.5)
WBC: 14.1 10*3/uL — AB (ref 4.0–10.5)

## 2018-03-07 LAB — BASIC METABOLIC PANEL
Anion gap: 15 (ref 5–15)
BUN: 18 mg/dL (ref 8–23)
CHLORIDE: 98 mmol/L (ref 98–111)
CO2: 20 mmol/L — ABNORMAL LOW (ref 22–32)
Calcium: 8.7 mg/dL — ABNORMAL LOW (ref 8.9–10.3)
Creatinine, Ser: 0.99 mg/dL (ref 0.61–1.24)
GFR calc non Af Amer: >60 mL/min (ref >60)
Glucose, Bld: 124 mg/dL — ABNORMAL HIGH (ref 70–99)
POTASSIUM: 3.9 mmol/L (ref 3.5–5.1)
SODIUM: 133 mmol/L — AB (ref 135–145)

## 2018-03-07 LAB — PROTIME-INR
INR: 1.11
Prothrombin Time: 14.2 seconds (ref 11.4–15.2)

## 2018-03-07 MED ORDER — HYDROMORPHONE HCL 1 MG/ML IJ SOLN
0.5000 mg | INTRAMUSCULAR | Status: DC | PRN
  Administered 2018-03-07: 0.5 mg via INTRAVENOUS
  Filled 2018-03-07: qty 1

## 2018-03-07 MED ORDER — SODIUM CHLORIDE 0.9 % IV SOLN: INTRAVENOUS | Status: AC

## 2018-03-07 MED ORDER — SODIUM CHLORIDE 0.9 % IV SOLN
INTRAVENOUS | Status: DC
  Administered 2018-03-07: 23:00:00 via INTRAVENOUS

## 2018-03-07 NOTE — H&P (Addendum)
History and Physical    Shandy Vi AVW:[id number] DOB: Aug 15, 1934 DOA: 03/07/2018  PCP: Shon Baton, MD   Patient coming from: Home  I have personally briefly reviewed patient's old medical records in [place]  Chief Complaint: Left hip pain  HPI: Darril Patriarca is a 83 y.o. male with medical history significant CAD, HTN, who presented to the ED with complaints of  proggressive left hip pain for the past 2 weeks.  Patient denies prior falls.  He lives with his wife.  Patient was referred to an orthopedist Dr. Orvilla Fus (does not do surgeries) was diagnosed with a stress fracture.  Pain unrelieved by home pain medications hence presentation to the ED. Patient denies chest pain or dizziness, reports chronic intermittent dysuria over the past 2 years that is unchanged.  Fever or chills.  No vomiting or loose stools.  He has maintained good p.o. intake.  ED Course: Mild tachycardia to 107.  Sodium mildly low 133.  WBC 14.  Pelvic x-ray-displaced fracture of the left femoral neck.,  Intact total right pain arthroplasty.  Dr. Holly Bodily surgery on-call consulted recommended admission to hospitalist service, surgery likely Monday 03/09/18.   Review of Systems: As per HPI all other systems reviewed and negative.  Past Medical History:  Diagnosis Date  . Arthritis   . Bruises easily   . CAD (coronary artery disease)   . Colon polyps   . Groin pain    Right  . Hypercholesterolemia    takes Lipitor daily  . Hypertension    takes Amlodipine,Ramipril,and Metoprolol daily  . Impaired hearing   . Joint pain   . Joint swelling   . Myocardial infarction ([place])    2003  . Osteoarthritis    Right hip  . Sleep apnea    uses CPAP;sleep study done > 40yrs ago    Past Surgical History:  Procedure Laterality Date  . CARDIAC CATHETERIZATION  >36yrs ago  . CARTILAGE SURGERY  1990   Right knee  . COLONOSCOPY    . CORONARY ANGIOPLASTY     1 stent  . KNEE SURGERY     bilateral  . TOTAL HIP ARTHROPLASTY  05/15/2011   Procedure: TOTAL HIP ARTHROPLASTY;  Surgeon: Kerin Salen, MD;  Location: [facility];  Service: Orthopedics;  Laterality: Right;  DEPUY/SROM/PINNACLE     reports that he has quit smoking. He has never used smokeless tobacco. He reports that he drinks alcohol. He reports that he does not use drugs.  No Known Allergies  Family History  Problem Relation Age of Onset  . Heart disease Brother   . Colon cancer Brother   . Anesthesia problems Neg Hx   . Hypotension Neg Hx   . Malignant hyperthermia Neg Hx   . Pseudochol deficiency Neg Hx   . Stomach cancer Neg Hx     Prior to Admission medications   Medication Sig Start Date End Date Taking? Authorizing Provider  amLODipine (NORVASC) 10 MG tablet Take 10 mg by mouth daily.    [provider]  aspirin 81 MG tablet Take 81 mg by mouth daily.    [provider]  atorvastatin (LIPITOR) 40 MG tablet Take 40 mg by mouth daily.    [provider]  celecoxib (CELEBREX) 200 MG capsule Take 200 mg by mouth 2 (two) times daily.    [provider]  metoprolol succinate (TOPROL-XL) 100 MG 24 hr tablet Take 100 mg by mouth daily. Take with or immediately following a  meal.    [provider]  Misc Natural Products (OSTEO BI-FLEX ADV DOUBLE ST PO) Take 1 tablet by mouth 2 (two) times daily.    [provider]    Physical Exam: Vitals:   03/07/18 2145 03/07/18 2148 03/07/18 2200 03/07/18 2230  BP:  125/78 127/67 126/66  Pulse:  (!) 107 (!) 106 (!) 101  Resp:  18    Temp:  97.6 F (36.4 C)    TempSrc:  Oral    SpO2:  96% 94% 95%  Weight: 83.9 kg     Height: 5\' 10"  (1.778 m)       Constitutional: NAD, calm, comfortable Vitals:   03/07/18 2145 03/07/18 2148 03/07/18 2200 03/07/18 2230  BP:  125/78 127/67 126/66  Pulse:  (!) 107 (!) 106 (!) 101  Resp:  18    Temp:  97.6 F (36.4 C)    TempSrc:  Oral    SpO2:  96% 94% 95%  Weight: 83.9 kg       Height: 5\' 10"  (1.778 m)      Eyes: PERRL, lids and conjunctivae normal ENMT: Mucous membranes are mildly dry. Posterior pharynx clear of any exudate or lesions.  Neck: normal, supple, no masses, no thyromegaly Respiratory: clear to auscultation bilaterally, no wheezing, no crackles. Normal respiratory effort. No accessory muscle use.  Cardiovascular: Mild tachycardia to 107, Regular rate and rhythm, Trace extremity edema left LE, 2+ pedal pulses.  Purpura right breast, patient unaware how he sustained this bruise. Abdomen: no tenderness, no masses palpated. No hepatosplenomegaly. Bowel sounds positive.  Musculoskeletal: no clubbing / cyanosis.   Skin: Senile purpura bilateral upper extremities,, chronic stasis changes bilateral lower extremities, no ulcers. No induration Neurologic: CN 2-12 grossly intact, Strength 5/5 in all extremities except left lower extremity not tested.Marland Kitchen  Psychiatric: Normal judgment and insight. Alert and oriented x 3. Normal mood.   Labs on Admission: I have personally reviewed following labs and imaging studies  CBC: Recent Labs  Lab 03/07/18 2154  WBC 14.1*  NEUTROABS 11.8*  HGB 13.4  HCT 41.6  MCV 95.6  PLT 809   Basic Metabolic Panel: Recent Labs  Lab 03/07/18 2154  NA 133*  K 3.9  CL 98  CO2 20*  GLUCOSE 124*  BUN 18  CREATININE 0.99  CALCIUM 8.7*    Radiological Exams on Admission: Dg Chest 1 View  Result Date: 03/07/2018 CLINICAL DATA:  Hip fxhip fracture EXAM: CHEST  1 VIEW COMPARISON:  None. FINDINGS: Normal mediastinum and cardiac silhouette. Normal pulmonary vasculature. Prominent vascular shadow along the superior RIGHT mediastinum unchanged. No evidence of effusion, infiltrate, or pneumothorax. No acute bony abnormality. IMPRESSION: No acute cardiopulmonary process. Electronically Signed   By: Suzy Bouchard M.D.   On: 03/07/2018 22:58   Dg Hip Unilat W Or Wo Pelvis 2-3 Views Left  Result Date: 03/07/2018 CLINICAL DATA:   83 year old male with left hip pain. EXAM: DG HIP (WITH OR WITHOUT PELVIS) 2-3V LEFT COMPARISON:  Pelvic radiograph dated 05/15/2011 FINDINGS: There is a displaced fracture of the left femoral neck with superior migration of the femur and impaction on the lateral acetabular roof. The left femoral head is poorly visualized due to advanced osteopenia but appears to be within the acetabulum. There is a total right hip arthroplasty which appears intact. IMPRESSION: 1. Displaced fracture of the left femoral neck. 2. Total right hip arthroplasty appears intact. Electronically Signed   By: Anner Crete M.D.   On: 03/07/2018 22:58  EKG: Independently reviewed.  Nonspecific T wave abnormalities diffusely, change compared to last EKG 2013.  Assessment/Plan Principal Problem:   Fracture of unspecified part of neck of left femur, initial encounter for closed fracture (HCC) Active Problems:   CAD (coronary artery disease)   HTN (hypertension)   Hypercholesterolemia   Left femur fracture- initial stress fracture progressed to displaced femoral neck fracture.  Denies falls.  Portable chest without acute abnormality.  EKG with nonspecific T wave abnormalities diffusely.  No chest pain.  History of CAD.  No CVA or pulmonary disease history. -Dr. Berenice Primas consulted in ED surgery likely Monday, 03/09/2018 - IVF N/s 100cc/hr x 12hrs (for mild tachycardia) - Morphine  CAD Hx-reports stent placement 20 years ago.  Denies chest pain, no shortness of breath.  EKG with nonspecific T-wave changes. -Pending pharm med rec, Continue Lipitor, metoprolol, Norvasc.  HTN- Stable - Pending med rec, Cont Home meds- Norvasc, metop  DVT prophylaxis: Scds Code Status: Full Family Communication: None at bedside Disposition Plan: Per rounding team Consults called: Orthopedist Admission status: Inpt, tele   Bethena Roys MD [facility] Pager [phone] From 6PM-2AM.  Otherwise please contact  night-coverage [email or web address] Password [email or web address]  03/07/2018, 11:08 PM

## 2018-03-07 NOTE — ED Provider Notes (Addendum)
[facility] EMERGENCY DEPARTMENT Provider Note   CSN: [id number] Arrival date & time: 03/07/18  2138     History   Chief Complaint Chief Complaint  Patient presents with  . Hip Pain    HPI Jeremy Sawyer is a 83 y.o. male.  HPI Patient presents with left hip pain.  Has had over the last week or 2.  Dr. Danella Maiers did a x-ray that apparently showed a "stress fracture".  Pain however is gotten more severe now.  Uncontrolled by narcotic pain medicine.  Increase leg swelling also.  No new fall.  No fevers.  No other injury.  Does have a bruise on the right chest but states he does not know how it happened. Past Medical History:  Diagnosis Date  . Arthritis   . Bruises easily   . CAD (coronary artery disease)   . Colon polyps   . Groin pain    Right  . Hypercholesterolemia    takes Lipitor daily  . Hypertension    takes Amlodipine,Ramipril,and Metoprolol daily  . Impaired hearing   . Joint pain   . Joint swelling   . Myocardial infarction ([place])    2003  . Osteoarthritis    Right hip  . Sleep apnea    uses CPAP;sleep study done > 79yrs ago    Patient Active Problem List   Diagnosis Date Noted  . Osteoarthritis of right hip 05/17/2011  . Anemia due to blood loss 05/17/2011  . CAD (coronary artery disease) 05/09/2011  . HTN (hypertension) 05/09/2011  . Hypercholesterolemia 05/09/2011  . Pre-op evaluation 05/09/2011    Past Surgical History:  Procedure Laterality Date  . CARDIAC CATHETERIZATION  >66yrs ago  . CARTILAGE SURGERY  1990   Right knee  . COLONOSCOPY    . CORONARY ANGIOPLASTY     1 stent  . KNEE SURGERY     bilateral  . TOTAL HIP ARTHROPLASTY  05/15/2011   Procedure: TOTAL HIP ARTHROPLASTY;  Surgeon: Kerin Salen, MD;  Location: [facility];  Service: Orthopedics;  Laterality: Right;  DEPUY/SROM/PINNACLE        Home Medications    Prior to Admission medications   Medication Sig Start Date End Date Taking? Authorizing Provider    amLODipine (NORVASC) 10 MG tablet Take 10 mg by mouth daily.    [provider]  aspirin 81 MG tablet Take 81 mg by mouth daily.    [provider]  atorvastatin (LIPITOR) 40 MG tablet Take 40 mg by mouth daily.    [provider]  celecoxib (CELEBREX) 200 MG capsule Take 200 mg by mouth 2 (two) times daily.    [provider]  metoprolol succinate (TOPROL-XL) 100 MG 24 hr tablet Take 100 mg by mouth daily. Take with or immediately following a meal.    [provider]  Misc Natural Products (OSTEO BI-FLEX ADV DOUBLE ST PO) Take 1 tablet by mouth 2 (two) times daily.    [provider]    Family History Family History  Problem Relation Age of Onset  . Heart disease Brother   . Colon cancer Brother   . Anesthesia problems Neg Hx   . Hypotension Neg Hx   . Malignant hyperthermia Neg Hx   . Pseudochol deficiency Neg Hx   . Stomach cancer Neg Hx     Social History Social History   Tobacco Use  . Smoking status: Former Research scientist (life sciences)  . Smokeless tobacco: Never Used  . Tobacco comment: quit  60yrs ago  Substance Use Topics  . Alcohol use: Yes    Comment: drinks 7 bottles wine a week  . Drug use: No     Allergies   Patient has no known allergies.   Review of Systems Review of Systems  Constitutional: Negative for fever.  HENT: Negative for congestion.   Respiratory: Negative for shortness of breath.   Cardiovascular: Positive for leg swelling. Negative for chest pain.  Gastrointestinal: Negative for abdominal pain.  Genitourinary: Negative for flank pain.  Musculoskeletal: Negative for back pain.       Left hip pain.  Skin: Negative for rash.  Neurological: Negative for headaches.  Hematological: Bruises/bleeds easily.  Psychiatric/Behavioral: Negative for confusion.     Physical Exam Updated Vital Signs BP 126/66   Pulse (!) 101   Temp 97.6 F (36.4 C) (Oral)   Resp 18   Ht 5\' 10"  (1.778 m)   Wt 83.9 kg   SpO2  95%   BMI 26.54 kg/m   Physical Exam  Constitutional: He appears well-developed.  HENT:  Head: Atraumatic.  Eyes: Pupils are equal, round, and reactive to light.  Neck: Neck supple.  Cardiovascular: Normal rate.  Pulmonary/Chest:  Ecchymosis right mid anterior chest wall without crepitance or deformity.  Abdominal: There is no tenderness.  Musculoskeletal: He exhibits tenderness.  Left lower extremity externally rotated.  Tenderness to left hip.  Some swelling from hip down.  Sensation intact in left foot but does have edema.  Neurological: He is alert.  Skin: Skin is warm.     ED Treatments / Results  Labs (all labs ordered are listed, but only abnormal results are displayed) Labs Reviewed  BASIC METABOLIC PANEL - Abnormal; Notable for the following components:      Result Value   Sodium 133 (*)    CO2 20 (*)    Glucose, Bld 124 (*)    Calcium 8.7 (*)    All other components within normal limits  CBC WITH DIFFERENTIAL/PLATELET - Abnormal; Notable for the following components:   WBC 14.1 (*)    Neutro Abs 11.8 (*)    Monocytes Absolute 1.2 (*)    Abs Immature Granulocytes 0.10 (*)    All other components within normal limits  URINALYSIS, ROUTINE W REFLEX MICROSCOPIC  PROTIME-INR  TYPE AND SCREEN    EKG EKG Interpretation  Date/Time:  Saturday March 07 2018 23:07:12 EST Ventricular Rate:  104 PR Interval:    QRS Duration: 90 QT Interval:  351 QTC Calculation: 462 R Axis:   55 Text Interpretation:  Sinus tachycardia Borderline T wave abnormalities Confirmed by Davonna Belling [phone]) on 03/07/2018 11:12:28 PM   Radiology Dg Chest 1 View  Result Date: 03/07/2018 CLINICAL DATA:  Hip fxhip fracture EXAM: CHEST  1 VIEW COMPARISON:  None. FINDINGS: Normal mediastinum and cardiac silhouette. Normal pulmonary vasculature. Prominent vascular shadow along the superior RIGHT mediastinum unchanged. No evidence of effusion, infiltrate, or pneumothorax. No acute bony  abnormality. IMPRESSION: No acute cardiopulmonary process. Electronically Signed   By: Suzy Bouchard M.D.   On: 03/07/2018 22:58   Dg Hip Unilat W Or Wo Pelvis 2-3 Views Left  Result Date: 03/07/2018 CLINICAL DATA:  83 year old male with left hip pain. EXAM: DG HIP (WITH OR WITHOUT PELVIS) 2-3V LEFT COMPARISON:  Pelvic radiograph dated 05/15/2011 FINDINGS: There is a displaced fracture of the left femoral neck with superior migration of the femur and impaction on the lateral acetabular roof. The left femoral head is poorly visualized due  to advanced osteopenia but appears to be within the acetabulum. There is a total right hip arthroplasty which appears intact. IMPRESSION: 1. Displaced fracture of the left femoral neck. 2. Total right hip arthroplasty appears intact. Electronically Signed   By: Anner Crete M.D.   On: 03/07/2018 22:58    Procedures Procedures (including critical care time)  Medications Ordered in ED Medications  0.9 %  sodium chloride infusion (has no administration in time range)  HYDROmorphone (DILAUDID) injection 0.5 mg (0.5 mg Intravenous Given 03/07/18 2308)     Initial Impression / Assessment and Plan / ED Course  I have reviewed the triage vital signs and the nursing notes.  Pertinent labs & imaging results that were available during my care of the patient were reviewed by me and considered in my medical decision making (see chart for details).     Patient with left hip pain.  Hip fracture on x-ray.  Discussed with Dr. Berenice Primas.  Likely operating room tomorrow.  Will admit to internal medicine.  Final Clinical Impressions(s) / ED Diagnoses   Final diagnoses:  Closed left hip fracture, initial encounter [facility])    ED Discharge Orders    None       Davonna Belling, MD 03/07/18 2258    Davonna Belling, MD 03/07/18 [phone]

## 2018-03-07 NOTE — ED Triage Notes (Signed)
Patient complaining of left sided hip pain. Was diagnosed with a recent stress fracture of hip a few weeks ago. Pain began to increase tonight and pt was unable to control pain with home pain meds.

## 2018-03-08 ENCOUNTER — Encounter (HOSPITAL_COMMUNITY): Payer: Self-pay

## 2018-03-08 LAB — SURGICAL PCR SCREEN
MRSA, PCR: NEGATIVE
Staphylococcus aureus: NEGATIVE

## 2018-03-08 LAB — TYPE AND SCREEN
ABO/RH(D): B POS
Antibody Screen: NEGATIVE

## 2018-03-08 MED ORDER — POVIDONE-IODINE 10 % EX SWAB: 2.0000 "application " | Freq: Once | CUTANEOUS | Status: DC

## 2018-03-08 MED ORDER — CHLORHEXIDINE GLUCONATE 4 % EX LIQD
60.0000 mL | Freq: Once | CUTANEOUS | Status: AC
  Administered 2018-03-09: 4 via TOPICAL
  Filled 2018-03-08: qty 60

## 2018-03-08 MED ORDER — MORPHINE SULFATE (PF) 2 MG/ML IV SOLN
2.0000 mg | INTRAVENOUS | Status: DC | PRN
  Administered 2018-03-08 (×3): 2 mg via INTRAVENOUS
  Filled 2018-03-08 (×3): qty 1

## 2018-03-08 MED ORDER — CHLORHEXIDINE GLUCONATE 4 % EX LIQD: 60.0000 mL | Freq: Once | CUTANEOUS | Status: DC

## 2018-03-08 MED ORDER — CEFAZOLIN SODIUM-DEXTROSE 2-4 GM/100ML-% IV SOLN
2.0000 g | INTRAVENOUS | Status: AC
  Filled 2018-03-08: qty 100

## 2018-03-08 MED ORDER — POLYETHYLENE GLYCOL 3350 17 G PO PACK: 17.0000 g | PACK | Freq: Every day | ORAL | Status: DC | PRN

## 2018-03-08 MED ORDER — ONDANSETRON HCL 4 MG/2ML IJ SOLN: 4.0000 mg | Freq: Four times a day (QID) | INTRAMUSCULAR | Status: DC | PRN

## 2018-03-08 MED ORDER — TRANEXAMIC ACID-NACL 1000-0.7 MG/100ML-% IV SOLN
1000.0000 mg | INTRAVENOUS | Status: AC
  Administered 2018-03-09: 1000 mg via INTRAVENOUS
  Filled 2018-03-08 (×3): qty 100

## 2018-03-08 MED ORDER — BUPIVACAINE LIPOSOME 1.3 % IJ SUSP
10.0000 mL | Freq: Once | INTRAMUSCULAR | Status: DC
  Filled 2018-03-08 (×4): qty 10

## 2018-03-08 MED ORDER — HYDROMORPHONE HCL 1 MG/ML IJ SOLN
1.0000 mg | INTRAMUSCULAR | Status: DC | PRN
  Administered 2018-03-08 – 2018-03-09 (×6): 1 mg via INTRAVENOUS
  Filled 2018-03-08 (×6): qty 1

## 2018-03-08 MED ORDER — MUPIROCIN 2 % EX OINT
1.0000 "application " | TOPICAL_OINTMENT | Freq: Two times a day (BID) | CUTANEOUS | Status: AC
  Administered 2018-03-09 – 2018-03-10 (×2): 1 via NASAL
  Filled 2018-03-08: qty 22

## 2018-03-08 MED ORDER — CEFAZOLIN SODIUM-DEXTROSE 2-4 GM/100ML-% IV SOLN
2.0000 g | INTRAVENOUS | Status: AC
  Administered 2018-03-09: 2 g via INTRAVENOUS
  Filled 2018-03-08 (×2): qty 100

## 2018-03-08 MED ORDER — ONDANSETRON HCL 4 MG PO TABS: 4.0000 mg | ORAL_TABLET | Freq: Four times a day (QID) | ORAL | Status: DC | PRN

## 2018-03-08 MED ORDER — ACETAMINOPHEN 650 MG RE SUPP: 650.0000 mg | Freq: Four times a day (QID) | RECTAL | Status: DC | PRN

## 2018-03-08 MED ORDER — ACETAMINOPHEN 325 MG PO TABS
650.0000 mg | ORAL_TABLET | Freq: Four times a day (QID) | ORAL | Status: DC | PRN
  Administered 2018-03-08 (×3): 650 mg via ORAL
  Filled 2018-03-08 (×3): qty 2

## 2018-03-08 MED ORDER — TRANEXAMIC ACID-NACL 1000-0.7 MG/100ML-% IV SOLN
1000.0000 mg | INTRAVENOUS | Status: DC
  Filled 2018-03-08 (×2): qty 100

## 2018-03-08 NOTE — Progress Notes (Signed)
Pt states he is very uncomfortable and can feel his muscles twitching  Gave morphine and tylenol, pt not due for any other pain PRN  Paged MD to inform  Still awaiting pharmacy to verify home medications

## 2018-03-08 NOTE — Progress Notes (Signed)
PROGRESS NOTE    Cassady Turano  EXB:[id number] DOB: May 23, 1934 DOA: 03/07/2018 PCP: Shon Baton, MD    Brief Narrative:  83 y.o. male with medical history significant CAD, HTN, who presented to the ED with complaints of  proggressive left hip pain for the past 2 weeks.  Patient denies prior falls.  He lives with his wife.  Patient was referred to an orthopedist Dr. Orvilla Fus (does not do surgeries) was diagnosed with a stress fracture.  Pain unrelieved by home pain medications hence presentation to the ED. Patient denies chest pain or dizziness, reports chronic intermittent dysuria over the past 2 years that is unchanged.  Fever or chills.  No vomiting or loose stools.  He has maintained good p.o. intake.  ED Course: Mild tachycardia to 107.  Sodium mildly low 133.  WBC 14.  Pelvic x-ray-displaced fracture of the left femoral neck.,  Intact total right pain arthroplasty.  Dr. Holly Bodily surgery on-call consulted recommended admission to hospitalist service, surgery likely Monday 03/09/18  Assessment & Plan:   Principal Problem:   Fracture of unspecified part of neck of left femur, initial encounter for closed fracture [facility]) Active Problems:   CAD (coronary artery disease)   HTN (hypertension)   Hypercholesterolemia  Left femur fracture - initial stress fracture progressed to displaced femoral neck fracture.  Denies falls.  Portable chest without acute abnormality.  EKG with nonspecific T wave abnormalities diffusely.  No chest pain.  History of CAD.  No CVA or pulmonary disease history. -Dr. Berenice Primas consulted with plan for surgery 03/09/2018 - continue with analgesia as tolerated  CAD Hx-reports stent placement 20 years ago.  Denies chest pain, no shortness of breath.  EKG with nonspecific T-wave changes. -Pending pharm med rec, Continue Lipitor, metoprolol, Norvasc. -No chest pain this MA  HTN - Cont Home meds- Norvasc, metop -Vitals reviewed stable  DVT prophylaxis:  SCD's Code Status: Full Family Communication: Pt in room, family not at bedside Disposition Plan: Uncertain at this time  Consultants:   Orthopedic Surgery  Procedures:     Antimicrobials: Anti-infectives (From admission, onward)   Start     Dose/Rate Route Frequency Ordered Stop   03/09/18 0600  ceFAZolin (ANCEF) IVPB 2g/100 mL premix     2 g 200 mL/hr over 30 Minutes Intravenous On call to O.R. 03/08/18 1022 03/10/18 0559   03/08/18 1000  ceFAZolin (ANCEF) IVPB 2g/100 mL premix     2 g 200 mL/hr over 30 Minutes Intravenous On call to O.R. 03/08/18 0959 03/09/18 0559       Subjective: Complaining of marked hip pain  Objective: Vitals:   03/08/18 0347 03/08/18 0752 03/08/18 1157 03/08/18 1313  BP: 127/62 118/60 (!) 144/61 132/64  Pulse: 97 97 (!) 101 99  Resp: 17 20 20    Temp: 99.6 F (37.6 C) 98.9 F (37.2 C)  98.8 F (37.1 C)  TempSrc: Oral Oral  Oral  SpO2: 95% 94% 94% 93%  Weight:      Height:        Intake/Output Summary (Last 24 hours) at 03/08/2018 1552 Last data filed at 03/08/2018 1248 Gross per 24 hour  Intake 1706.66 ml  Output 75 ml  Net 1631.66 ml   Filed Weights   03/07/18 2145 03/08/18 0038  Weight: 83.9 kg 84 kg    Examination:  General exam: Appears calm and comfortable  Respiratory system: Clear to auscultation. Respiratory effort normal. Cardiovascular system: S1 & S2 heard, RRR Gastrointestinal system: Abdomen is nondistended, soft and  nontender. No organomegaly or masses felt. Normal bowel sounds heard. Central nervous system: Alert and oriented. No focal neurological deficits. Extremities: Symmetric 5 x 5 power. Skin: No rashes, lesions Psychiatry: Judgement and insight appear normal. Mood & affect appropriate.   Data Reviewed: I have personally reviewed following labs and imaging studies  CBC: Recent Labs  Lab 03/07/18 2154  WBC 14.1*  NEUTROABS 11.8*  HGB 13.4  HCT 41.6  MCV 95.6  PLT 275   Basic Metabolic  Panel: Recent Labs  Lab 03/07/18 2154  NA 133*  K 3.9  CL 98  CO2 20*  GLUCOSE 124*  BUN 18  CREATININE 0.99  CALCIUM 8.7*   GFR: Estimated Creatinine Clearance: 60.4 mL/min (by C-G formula based on SCr of 0.99 mg/dL). Liver Function Tests: No results for input(s): AST, ALT, ALKPHOS, BILITOT, PROT, ALBUMIN in the last 168 hours. No results for input(s): LIPASE, AMYLASE in the last 168 hours. No results for input(s): AMMONIA in the last 168 hours. Coagulation Profile: Recent Labs  Lab 03/07/18 2257  INR 1.11   Cardiac Enzymes: No results for input(s): CKTOTAL, CKMB, CKMBINDEX, TROPONINI in the last 168 hours. BNP (last 3 results) No results for input(s): PROBNP in the last 8760 hours. HbA1C: No results for input(s): HGBA1C in the last 72 hours. CBG: No results for input(s): GLUCAP in the last 168 hours. Lipid Profile: No results for input(s): CHOL, HDL, LDLCALC, TRIG, CHOLHDL, LDLDIRECT in the last 72 hours. Thyroid Function Tests: No results for input(s): TSH, T4TOTAL, FREET4, T3FREE, THYROIDAB in the last 72 hours. Anemia Panel: No results for input(s): VITAMINB12, FOLATE, FERRITIN, TIBC, IRON, RETICCTPCT in the last 72 hours. Sepsis Labs: No results for input(s): PROCALCITON, LATICACIDVEN in the last 168 hours.  Recent Results (from the past 240 hour(s))  Surgical PCR screen     Status: None   Collection Time: 03/08/18  8:20 AM  Result Value Ref Range Status   MRSA, PCR NEGATIVE NEGATIVE Final   Staphylococcus aureus NEGATIVE NEGATIVE Final    Comment: (NOTE) The Xpert SA Assay (FDA approved for NASAL specimens in patients 47 years of age and older), is one component of a comprehensive surveillance program. It is not intended to diagnose infection nor to guide or monitor treatment. Performed at [facility], [place] [address] [place], [place] [address]      Radiology Studies: Dg Chest 1 View  Result Date: 03/07/2018 CLINICAL DATA:  Hip fxhip fracture  EXAM: CHEST  1 VIEW COMPARISON:  None. FINDINGS: Normal mediastinum and cardiac silhouette. Normal pulmonary vasculature. Prominent vascular shadow along the superior RIGHT mediastinum unchanged. No evidence of effusion, infiltrate, or pneumothorax. No acute bony abnormality. IMPRESSION: No acute cardiopulmonary process. Electronically Signed   By: Suzy Bouchard M.D.   On: 03/07/2018 22:58   Dg Hip Unilat W Or Wo Pelvis 2-3 Views Left  Result Date: 03/07/2018 CLINICAL DATA:  83 year old male with left hip pain. EXAM: DG HIP (WITH OR WITHOUT PELVIS) 2-3V LEFT COMPARISON:  Pelvic radiograph dated 05/15/2011 FINDINGS: There is a displaced fracture of the left femoral neck with superior migration of the femur and impaction on the lateral acetabular roof. The left femoral head is poorly visualized due to advanced osteopenia but appears to be within the acetabulum. There is a total right hip arthroplasty which appears intact. IMPRESSION: 1. Displaced fracture of the left femoral neck. 2. Total right hip arthroplasty appears intact. Electronically Signed   By: Anner Crete M.D.   On: 03/07/2018  22:58    Scheduled Meds: . [START ON 03/09/2018] bupivacaine liposome  10 mL Infiltration Once  . [START ON 03/09/2018] chlorhexidine  60 mL Topical Once  . mupirocin ointment  1 application Nasal BID  . [START ON 03/09/2018] povidone-iodine  2 application Topical Once   Continuous Infusions: .  ceFAZolin (ANCEF) IV    . [START ON 03/09/2018]  ceFAZolin (ANCEF) IV    . [START ON 03/09/2018] tranexamic acid       LOS: 1 day   Marylu Lund, MD [facility] Pager On Amion  If 7PM-7AM, please contact night-coverage 03/08/2018, 3:52 PM

## 2018-03-08 NOTE — Progress Notes (Signed)
Pt calling RN to room for pain relief  Second page sent to MD regarding coverage  Attempted PRN medications, repositioning, ice and other  No relief from any

## 2018-03-08 NOTE — Progress Notes (Signed)
Spoke to MD Wyline Copas, he placed new PRN medication orders Updated pt MD at bedside

## 2018-03-08 NOTE — Plan of Care (Signed)
  Problem: Nutrition: Goal: Adequate nutrition will be maintained Outcome: Completed/Met   Problem: Coping: Goal: Level of anxiety will decrease Outcome: Completed/Met

## 2018-03-08 NOTE — Progress Notes (Signed)
Report given to RN on [facility] [place]. All questions answered.

## 2018-03-08 NOTE — Consult Note (Signed)
Reason for Consult:l fem neck fracture Referring Physician: hospitalists  Jeremy Sawyer is an 83 y.o. male.  HPI: Pt is well known to our office and had  r thr done by Dr. Mayer Camel several years ago.  He has done well with that.  He was seen by Dr. Acquanetta Sit with severe hip arthritis left side and had unfortunately a stress fracture/reaction.  He went on to complete his stress fracture and has been unable to ambulate since that time.  He presented yesterday to the emergency room with a completely displaced femoral neck fracture and we are consulted for management.  He is unable to stand or walk at this point because of severity of pain.  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 Arthritis   . Bruises easily   . CAD (coronary artery disease)   . Colon polyps   . Groin pain    Right  . Hypercholesterolemia    takes Lipitor daily  . Hypertension    takes Amlodipine,Ramipril,and Metoprolol daily  . Impaired hearing   . Joint pain   . Joint swelling   . Myocardial infarction ([place])    2003  . Osteoarthritis    Right hip  . Sleep apnea    uses CPAP;sleep study done > 39yrs ago    Past Surgical History:  Procedure Laterality Date  . CARDIAC CATHETERIZATION  >66yrs ago  . CARTILAGE SURGERY  1990   Right knee  . COLONOSCOPY    . CORONARY ANGIOPLASTY     1 stent  . KNEE SURGERY     bilateral  . TOTAL HIP ARTHROPLASTY  05/15/2011   Procedure: TOTAL HIP ARTHROPLASTY;  Surgeon: Kerin Salen, MD;  Location: [facility];  Service: Orthopedics;  Laterality: Right;  DEPUY/SROM/PINNACLE    Family History  Problem Relation Age of Onset  . Heart disease Brother   . Colon cancer Brother   . Anesthesia problems Neg Hx   . Hypotension Neg Hx   . Malignant hyperthermia Neg Hx   . Pseudochol deficiency Neg Hx   . Stomach cancer Neg Hx     Social History:  reports that he has quit smoking. He has never used smokeless tobacco. He  reports that he drinks alcohol. He reports that he does not use drugs.  Allergies: No Known Allergies  Medications: I have reviewed the patient's current medications.  Results for orders placed or performed during the hospital encounter of 03/07/18 (from the past 48 hour(s))  Basic metabolic panel     Status: Abnormal   Collection Time: 03/07/18  9:54 PM  Result Value Ref Range   Sodium 133 (L) 135 - 145 mmol/L   Potassium 3.9 3.5 - 5.1 mmol/L   Chloride 98 98 - 111 mmol/L   CO2 20 (L) 22 - 32 mmol/L   Glucose, Bld 124 (H) 70 - 99 mg/dL   BUN 18 8 - 23 mg/dL   Creatinine, Ser 0.99 0.61 - 1.24 mg/dL   Calcium 8.7 (L) 8.9 - 10.3 mg/dL   GFR calc non Af Amer >60 >60 mL/min   GFR calc Af Amer >60 >60 mL/min   Anion gap 15 5 - 15    Comment: Performed at [facility], [place] [address] [place], [place] [address]  CBC with Differential     Status: Abnormal   Collection Time: 03/07/18  9:54 PM  Result Value Ref Range  WBC 14.1 (H) 4.0 - 10.5 K/uL   RBC 4.35 4.22 - 5.81 MIL/uL   Hemoglobin 13.4 13.0 - 17.0 g/dL   HCT 41.6 39.0 - 52.0 %   MCV 95.6 80.0 - 100.0 fL   MCH 30.8 26.0 - 34.0 pg   MCHC 32.2 30.0 - 36.0 g/dL   RDW 13.0 11.5 - 15.5 %   Platelets 189 150 - 400 K/uL   nRBC 0.0 0.0 - 0.2 %   Neutrophils Relative % 83 %   Neutro Abs 11.8 (H) 1.7 - 7.7 K/uL   Lymphocytes Relative 7 %   Lymphs Abs 0.9 0.7 - 4.0 K/uL   Monocytes Relative 9 %   Monocytes Absolute 1.2 (H) 0.1 - 1.0 K/uL   Eosinophils Relative 0 %   Eosinophils Absolute 0.1 0.0 - 0.5 K/uL   Basophils Relative 0 %   Basophils Absolute 0.0 0.0 - 0.1 K/uL   Immature Granulocytes 1 %   Abs Immature Granulocytes 0.10 (H) 0.00 - 0.07 K/uL    Comment: Performed at [facility] [address], [place], [place] [address]  Protime-INR     Status: None   Collection Time: 03/07/18 10:57 PM  Result Value Ref Range   Prothrombin Time 14.2 11.4 - 15.2 seconds   INR 1.11     Comment: Performed at [facility] [address] [place], [place] [address]  Type and screen [place]     Status: None   Collection Time: 03/07/18 11:00 PM  Result Value Ref Range   ABO/RH(D) B POS    Antibody Screen NEG    Sample Expiration      03/10/2018 Performed at [facility], [place] [address] [place], [place] [address]     Dg Chest 1 View  Result Date: 03/07/2018 CLINICAL DATA:  Hip fxhip fracture EXAM: CHEST  1 VIEW COMPARISON:  None. FINDINGS: Normal mediastinum and cardiac silhouette. Normal pulmonary vasculature. Prominent vascular shadow along the superior RIGHT mediastinum unchanged. No evidence of effusion, infiltrate, or pneumothorax. No acute bony abnormality. IMPRESSION: No acute cardiopulmonary process. Electronically Signed   By: Suzy Bouchard M.D.   On: 03/07/2018 22:58   Dg Hip Unilat W Or Wo Pelvis 2-3 Views Left  Result Date: 03/07/2018 CLINICAL DATA:  83 year old male with left hip pain. EXAM: DG HIP (WITH OR WITHOUT PELVIS) 2-3V LEFT COMPARISON:  Pelvic radiograph dated 05/15/2011 FINDINGS: There is a displaced fracture of the left femoral neck with superior migration of the femur and impaction on the lateral acetabular roof. The left femoral head is poorly visualized due to advanced osteopenia but appears to be within the acetabulum. There is a total right hip arthroplasty which appears intact. IMPRESSION: 1. Displaced fracture of the left femoral neck. 2. Total right hip arthroplasty appears intact. Electronically Signed   By: Anner Crete M.D.   On: 03/07/2018 22:58    ROS  ROS: I have reviewed the patient's review of systems thoroughly and there are no positive responses as relates to the HPI. Blood pressure 118/60, pulse 97, temperature 99.6 F (37.6 C), temperature source Oral, resp. rate 20, height 5\' 10"  (1.778 m), weight 84 kg, SpO2 94 %. Physical Exam Well-developed well-nourished patient in no acute distress. Alert and oriented x3 HEENT:within  normal limits Cardiac: Regular rate and rhythm Pulmonary: Lungs clear to auscultation Abdomen: Soft and nontender.  Normal active bowel sounds  Musculoskeletal: (Left hip: Painful range of motion.  Limited range  of motion.  Neurovascular intact distally.  Patient sits with the leg slightly flexed and externally rotated.) Assessment/Plan: 83 year old fairly active male who has history of severe arthritis left hip and recently had stress fracture followed by completion of fracture of femoral neck left side.//He needs total hip replacement at this point.  He will likely need a brief rehab stay afterwards.  We will set this up for tomorrow as will be important to have our standard team available for hip replacement.  I have had long discussion with the patient and his son who certainly understanding of the risks of surgery including but not limited to bleeding, infection, need for further surgery, and failure of the surgery to help.  They understand there is a slight risk of death and around the time of surgery but wished to proceed.  We will keep him n.p.o. after midnight and make sure that he is off all blood thinners starting tomorrow.  Alta Corning 03/08/2018, 9:19 AM

## 2018-03-08 NOTE — Progress Notes (Signed)
Called pharmacy regarding pt PTA needs to be completed and pt son is currently at bedside Pharmacy stated a technician will be on the way shortly

## 2018-03-08 NOTE — Progress Notes (Signed)
Placed pt signed consent in chart Surgical PCR collected Provided pt with incentive spirometry, pt verbalizes understanding

## 2018-03-09 ENCOUNTER — Encounter (HOSPITAL_COMMUNITY)
Admission: EM | Disposition: A | Discharge status: Skilled Nursing Facility | Payer: Self-pay | Source: Home / Self Care | Attending: Internal Medicine

## 2018-03-09 ENCOUNTER — Encounter (HOSPITAL_COMMUNITY): Payer: Self-pay | Admitting: Certified Registered Nurse Anesthetist

## 2018-03-09 ENCOUNTER — Inpatient Hospital Stay (HOSPITAL_COMMUNITY): Payer: Medicare Other | Admitting: Certified Registered Nurse Anesthetist

## 2018-03-09 ENCOUNTER — Inpatient Hospital Stay (HOSPITAL_COMMUNITY): Payer: Medicare Other

## 2018-03-09 DIAGNOSIS — E7800 Pure hypercholesterolemia, unspecified: Secondary | ICD-10-CM

## 2018-03-09 HISTORY — PX: TOTAL HIP ARTHROPLASTY: SHX124

## 2018-03-09 SURGERY — ARTHROPLASTY, HIP, TOTAL, ANTERIOR APPROACH
Anesthesia: General | Laterality: Left

## 2018-03-09 MED ORDER — TRANEXAMIC ACID-NACL 1000-0.7 MG/100ML-% IV SOLN
1000.0000 mg | Freq: Once | INTRAVENOUS | Status: AC
  Administered 2018-03-09: 1000 mg via INTRAVENOUS
  Filled 2018-03-09: qty 100

## 2018-03-09 MED ORDER — METHOCARBAMOL 1000 MG/10ML IJ SOLN
500.0000 mg | Freq: Four times a day (QID) | INTRAVENOUS | Status: DC | PRN
  Filled 2018-03-09: qty 5

## 2018-03-09 MED ORDER — LACTATED RINGERS IV SOLN
INTRAVENOUS | Status: DC | PRN
  Administered 2018-03-09 (×2): via INTRAVENOUS

## 2018-03-09 MED ORDER — FENTANYL CITRATE (PF) 250 MCG/5ML IJ SOLN
INTRAMUSCULAR | Status: AC
  Filled 2018-03-09: qty 5

## 2018-03-09 MED ORDER — ACETAMINOPHEN 500 MG PO TABS
500.0000 mg | ORAL_TABLET | Freq: Four times a day (QID) | ORAL | Status: AC
  Administered 2018-03-09 – 2018-03-10 (×4): 500 mg via ORAL
  Filled 2018-03-09 (×4): qty 1

## 2018-03-09 MED ORDER — DEXAMETHASONE SODIUM PHOSPHATE 10 MG/ML IJ SOLN
INTRAMUSCULAR | Status: DC | PRN
  Administered 2018-03-09: 10 mg via INTRAVENOUS

## 2018-03-09 MED ORDER — ONDANSETRON HCL 4 MG/2ML IJ SOLN
INTRAMUSCULAR | Status: DC | PRN
  Administered 2018-03-09: 4 mg via INTRAVENOUS

## 2018-03-09 MED ORDER — PROPOFOL 10 MG/ML IV BOLUS
INTRAVENOUS | Status: DC | PRN
  Administered 2018-03-09: 100 mg via INTRAVENOUS

## 2018-03-09 MED ORDER — 0.9 % SODIUM CHLORIDE (POUR BTL) OPTIME
TOPICAL | Status: DC | PRN
  Administered 2018-03-09: 1000 mL

## 2018-03-09 MED ORDER — FENTANYL CITRATE (PF) 100 MCG/2ML IJ SOLN
INTRAMUSCULAR | Status: DC | PRN
  Administered 2018-03-09 (×6): 50 ug via INTRAVENOUS

## 2018-03-09 MED ORDER — ONDANSETRON HCL 4 MG/2ML IJ SOLN
INTRAMUSCULAR | Status: AC
  Filled 2018-03-09: qty 2

## 2018-03-09 MED ORDER — PHENYLEPHRINE HCL 10 MG/ML IJ SOLN
INTRAMUSCULAR | Status: DC | PRN
  Administered 2018-03-09 (×2): 120 ug via INTRAVENOUS
  Administered 2018-03-09: 100 ug via INTRAVENOUS

## 2018-03-09 MED ORDER — METHOCARBAMOL 500 MG PO TABS
500.0000 mg | ORAL_TABLET | Freq: Four times a day (QID) | ORAL | Status: DC | PRN
  Administered 2018-03-10 – 2018-03-13 (×5): 500 mg via ORAL
  Filled 2018-03-09 (×4): qty 1

## 2018-03-09 MED ORDER — PHENYLEPHRINE 40 MCG/ML (10ML) SYRINGE FOR IV PUSH (FOR BLOOD PRESSURE SUPPORT)
PREFILLED_SYRINGE | INTRAVENOUS | Status: AC
  Filled 2018-03-09: qty 10

## 2018-03-09 MED ORDER — SODIUM CHLORIDE 0.9 % IV SOLN
INTRAVENOUS | Status: DC | PRN
  Administered 2018-03-09: 40 ug/min via INTRAVENOUS

## 2018-03-09 MED ORDER — BUPIVACAINE HCL (PF) 0.5 % IJ SOLN
INTRAMUSCULAR | Status: AC
  Filled 2018-03-09: qty 20

## 2018-03-09 MED ORDER — SODIUM CHLORIDE 0.9 % IV SOLN: INTRAVENOUS | Status: DC

## 2018-03-09 MED ORDER — SENNOSIDES-DOCUSATE SODIUM 8.6-50 MG PO TABS: 1.0000 | ORAL_TABLET | Freq: Every evening | ORAL | Status: DC | PRN

## 2018-03-09 MED ORDER — MEPERIDINE HCL 50 MG/ML IJ SOLN: 6.2500 mg | INTRAMUSCULAR | Status: DC | PRN

## 2018-03-09 MED ORDER — ASPIRIN EC 325 MG PO TBEC: 325.0000 mg | DELAYED_RELEASE_TABLET | Freq: Two times a day (BID) | ORAL | 0 refills | Status: DC

## 2018-03-09 MED ORDER — FINASTERIDE 5 MG PO TABS
5.0000 mg | ORAL_TABLET | Freq: Every day | ORAL | Status: DC
  Administered 2018-03-10 – 2018-03-13 (×4): 5 mg via ORAL
  Filled 2018-03-09 (×4): qty 1

## 2018-03-09 MED ORDER — LIDOCAINE 2% (20 MG/ML) 5 ML SYRINGE
INTRAMUSCULAR | Status: AC
  Filled 2018-03-09: qty 5

## 2018-03-09 MED ORDER — METOPROLOL SUCCINATE ER 100 MG PO TB24
100.0000 mg | ORAL_TABLET | Freq: Every day | ORAL | Status: DC
  Administered 2018-03-10 – 2018-03-13 (×4): 100 mg via ORAL
  Filled 2018-03-09 (×5): qty 1

## 2018-03-09 MED ORDER — DOCUSATE SODIUM 100 MG PO CAPS
100.0000 mg | ORAL_CAPSULE | Freq: Two times a day (BID) | ORAL | Status: DC
  Administered 2018-03-09 – 2018-03-13 (×7): 100 mg via ORAL
  Filled 2018-03-09 (×7): qty 1

## 2018-03-09 MED ORDER — FENTANYL CITRATE (PF) 100 MCG/2ML IJ SOLN
INTRAMUSCULAR | Status: AC
  Administered 2018-03-09: 50 ug via INTRAVENOUS
  Filled 2018-03-09: qty 2

## 2018-03-09 MED ORDER — ACETAMINOPHEN 325 MG PO TABS
325.0000 mg | ORAL_TABLET | Freq: Four times a day (QID) | ORAL | Status: DC | PRN
  Filled 2018-03-09: qty 2

## 2018-03-09 MED ORDER — ATORVASTATIN CALCIUM 40 MG PO TABS
40.0000 mg | ORAL_TABLET | Freq: Every day | ORAL | Status: DC
  Administered 2018-03-10 – 2018-03-12 (×3): 40 mg via ORAL
  Filled 2018-03-09 (×3): qty 1

## 2018-03-09 MED ORDER — ONDANSETRON HCL 4 MG PO TABS: 4.0000 mg | ORAL_TABLET | Freq: Four times a day (QID) | ORAL | Status: DC | PRN

## 2018-03-09 MED ORDER — LIDOCAINE 2% (20 MG/ML) 5 ML SYRINGE
INTRAMUSCULAR | Status: DC | PRN
  Administered 2018-03-09: 100 mg via INTRAVENOUS

## 2018-03-09 MED ORDER — FENTANYL CITRATE (PF) 100 MCG/2ML IJ SOLN
25.0000 ug | INTRAMUSCULAR | Status: DC | PRN
  Administered 2018-03-09 (×2): 50 ug via INTRAVENOUS

## 2018-03-09 MED ORDER — HYDROCODONE-ACETAMINOPHEN 5-325 MG PO TABS
1.0000 | ORAL_TABLET | ORAL | Status: DC | PRN
  Administered 2018-03-09: 2 via ORAL
  Administered 2018-03-10 – 2018-03-11 (×5): 1 via ORAL
  Administered 2018-03-11 – 2018-03-12 (×3): 2 via ORAL
  Administered 2018-03-13 (×2): 1 via ORAL
  Administered 2018-03-13: 2 via ORAL
  Filled 2018-03-09 (×2): qty 2
  Filled 2018-03-09: qty 1
  Filled 2018-03-09: qty 2
  Filled 2018-03-09: qty 1
  Filled 2018-03-09 (×3): qty 2
  Filled 2018-03-09: qty 1
  Filled 2018-03-09: qty 2
  Filled 2018-03-09: qty 1

## 2018-03-09 MED ORDER — AMLODIPINE BESYLATE 10 MG PO TABS
10.0000 mg | ORAL_TABLET | Freq: Every day | ORAL | Status: DC
  Administered 2018-03-10 – 2018-03-13 (×4): 10 mg via ORAL
  Filled 2018-03-09 (×4): qty 1

## 2018-03-09 MED ORDER — ALUM & MAG HYDROXIDE-SIMETH 200-200-20 MG/5ML PO SUSP
30.0000 mL | ORAL | Status: DC | PRN
  Administered 2018-03-11 (×2): 30 mL via ORAL
  Filled 2018-03-09 (×2): qty 30

## 2018-03-09 MED ORDER — DEXAMETHASONE SODIUM PHOSPHATE 10 MG/ML IJ SOLN
INTRAMUSCULAR | Status: AC
  Filled 2018-03-09: qty 1

## 2018-03-09 MED ORDER — ONDANSETRON HCL 4 MG/2ML IJ SOLN: 4.0000 mg | Freq: Four times a day (QID) | INTRAMUSCULAR | Status: DC | PRN

## 2018-03-09 MED ORDER — BISACODYL 5 MG PO TBEC
5.0000 mg | DELAYED_RELEASE_TABLET | Freq: Every day | ORAL | Status: DC | PRN
  Administered 2018-03-10: 5 mg via ORAL
  Filled 2018-03-09: qty 1

## 2018-03-09 MED ORDER — TAMSULOSIN HCL 0.4 MG PO CAPS
0.4000 mg | ORAL_CAPSULE | Freq: Every day | ORAL | Status: DC
  Administered 2018-03-10 – 2018-03-12 (×3): 0.4 mg via ORAL
  Filled 2018-03-09 (×4): qty 1

## 2018-03-09 MED ORDER — ASPIRIN EC 325 MG PO TBEC
325.0000 mg | DELAYED_RELEASE_TABLET | Freq: Two times a day (BID) | ORAL | Status: DC
  Administered 2018-03-10 – 2018-03-13 (×7): 325 mg via ORAL
  Filled 2018-03-09 (×7): qty 1

## 2018-03-09 MED ORDER — CEFAZOLIN SODIUM-DEXTROSE 2-4 GM/100ML-% IV SOLN
2.0000 g | Freq: Three times a day (TID) | INTRAVENOUS | Status: AC
  Administered 2018-03-09 – 2018-03-11 (×6): 2 g via INTRAVENOUS
  Filled 2018-03-09 (×6): qty 100

## 2018-03-09 SURGICAL SUPPLY — 71 items
ACETAB CUP W GRIPTION 54MM (Plate) ×1 IMPLANT
ACETAB CUP W/GRIPTION 54 (Plate) ×2 IMPLANT
APL SKNCLS STERI-STRIP NONHPOA (GAUZE/BANDAGES/DRESSINGS) ×1
BALL HIP ARTICU EZE 36 8.5 (Hips) IMPLANT
BENZOIN TINCTURE PRP APPL 2/3 (GAUZE/BANDAGES/DRESSINGS) ×3 IMPLANT
BLADE CLIPPER SURG (BLADE) IMPLANT
BLADE SAGITTAL 25.0X1.27X90 (BLADE) ×1 IMPLANT
BLADE SAGITTAL 25.0X1.27X90MM (BLADE) ×1
BNDG COHESIVE 6X5 TAN STRL LF (GAUZE/BANDAGES/DRESSINGS) IMPLANT
BNDG GAUZE ELAST 4 BULKY (GAUZE/BANDAGES/DRESSINGS) IMPLANT
CELLS DAT CNTRL 66122 CELL SVR (MISCELLANEOUS) IMPLANT
CLOSURE STERI-STRIP 1/2X4 (GAUZE/BANDAGES/DRESSINGS)
CLSR STERI-STRIP ANTIMIC 1/2X4 (GAUZE/BANDAGES/DRESSINGS) IMPLANT
COVER PERINEAL POST (MISCELLANEOUS) ×3 IMPLANT
COVER SURGICAL LIGHT HANDLE (MISCELLANEOUS) ×3 IMPLANT
COVER WAND RF STERILE (DRAPES) ×3 IMPLANT
CUP ACETAB W/GRIPTION 54 (Plate) IMPLANT
DRAPE C-ARM 42X72 X-RAY (DRAPES) ×3 IMPLANT
DRAPE STERI IOBAN 125X83 (DRAPES) ×3 IMPLANT
DRAPE U-SHAPE 47X51 STRL (DRAPES) ×6 IMPLANT
DRSG AQUACEL AG ADV 3.5X10 (GAUZE/BANDAGES/DRESSINGS) ×3 IMPLANT
DURAPREP 26ML APPLICATOR (WOUND CARE) ×3 IMPLANT
ELECT BLADE 4.0 EZ CLEAN MEGAD (MISCELLANEOUS)
ELECT CAUTERY BLADE 6.4 (BLADE) ×3 IMPLANT
ELECT REM PT RETURN 9FT ADLT (ELECTROSURGICAL) ×3
ELECTRODE BLDE 4.0 EZ CLN MEGD (MISCELLANEOUS) IMPLANT
ELECTRODE REM PT RTRN 9FT ADLT (ELECTROSURGICAL) ×1 IMPLANT
GLOVE BIO SURGEON STRL SZ7 (GLOVE) ×3 IMPLANT
GLOVE BIOGEL M 6.5 STRL (GLOVE) ×2 IMPLANT
GLOVE BIOGEL PI IND STRL 8 (GLOVE) ×2 IMPLANT
GLOVE BIOGEL PI INDICATOR 8 (GLOVE) ×4
GLOVE ECLIPSE 6.5 STRL STRAW (GLOVE) ×2 IMPLANT
GLOVE ECLIPSE 7.5 STRL STRAW (GLOVE) ×9 IMPLANT
GOWN STRL REUS W/ TWL LRG LVL3 (GOWN DISPOSABLE) ×2 IMPLANT
GOWN STRL REUS W/ TWL XL LVL3 (GOWN DISPOSABLE) ×2 IMPLANT
GOWN STRL REUS W/TWL LRG LVL3 (GOWN DISPOSABLE) ×9
GOWN STRL REUS W/TWL XL LVL3 (GOWN DISPOSABLE) ×6
HIP BALL ARTICU EZE 36 8.5 (Hips) ×3 IMPLANT
HOOD PEEL AWAY FACE SHEILD DIS (HOOD) ×6 IMPLANT
KIT BASIN OR (CUSTOM PROCEDURE TRAY) ×3 IMPLANT
KIT TURNOVER KIT B (KITS) ×3 IMPLANT
LINER NEUTRAL 36ID 54OD (Liner) ×3 IMPLANT
MANIFOLD NEPTUNE II (INSTRUMENTS) ×3 IMPLANT
NDL SPNL 22GX3.5 QUINCKE BK (NEEDLE) ×1 IMPLANT
NEEDLE SPNL 22GX3.5 QUINCKE BK (NEEDLE) ×3 IMPLANT
NS IRRIG 1000ML POUR BTL (IV SOLUTION) ×3 IMPLANT
PACK TOTAL JOINT (CUSTOM PROCEDURE TRAY) ×3 IMPLANT
PACK UNIVERSAL I (CUSTOM PROCEDURE TRAY) ×3 IMPLANT
PAD ARMBOARD 7.5X6 YLW CONV (MISCELLANEOUS) ×6 IMPLANT
RETRACTOR WND ALEXIS 18 MED (MISCELLANEOUS) IMPLANT
RTRCTR WOUND ALEXIS 18CM MED (MISCELLANEOUS)
RTRCTR WOUND ALEXIS 18CM SML (INSTRUMENTS) ×3
SAVER CELL AAL HAEMONETICS (INSTRUMENTS) ×1 IMPLANT
SAW OSC TIP CART 19.5X105X1.3 (SAW) ×3 IMPLANT
SCREW 6.5MMX25MM (Screw) ×2 IMPLANT
SPONGE LAP 18X18 X RAY DECT (DISPOSABLE) IMPLANT
STAPLER VISISTAT 35W (STAPLE) IMPLANT
STEM CORAIL KA12 (Stem) ×2 IMPLANT
SUT ETHIBOND NAB CT1 #1 30IN (SUTURE) ×6 IMPLANT
SUT MNCRL AB 3-0 PS2 18 (SUTURE) IMPLANT
SUT VIC AB 0 CT1 27 (SUTURE) ×3
SUT VIC AB 0 CT1 27XBRD ANBCTR (SUTURE) ×1 IMPLANT
SUT VIC AB 1 CT1 27 (SUTURE) ×6
SUT VIC AB 1 CT1 27XBRD ANBCTR (SUTURE) ×2 IMPLANT
SUT VIC AB 2-0 CT1 27 (SUTURE) ×3
SUT VIC AB 2-0 CT1 TAPERPNT 27 (SUTURE) ×1 IMPLANT
SYR 50ML LL SCALE MARK (SYRINGE) ×3 IMPLANT
TOWEL OR 17X24 6PK STRL BLUE (TOWEL DISPOSABLE) ×3 IMPLANT
TOWEL OR 17X26 10 PK STRL BLUE (TOWEL DISPOSABLE) ×3 IMPLANT
TRAY CATH 16FR W/PLASTIC CATH (SET/KITS/TRAYS/PACK) ×3 IMPLANT
TRAY FOLEY CATH SILVER 16FR (SET/KITS/TRAYS/PACK) IMPLANT

## 2018-03-09 NOTE — Progress Notes (Signed)
Called report to short stay, spoke to [facility]  Informed pt wife and daughter at bedside that pt will be transported shortly to [place] packed all belongings

## 2018-03-09 NOTE — Progress Notes (Signed)
Pt transported to OR  Family at bedside

## 2018-03-09 NOTE — Anesthesia Preprocedure Evaluation (Addendum)
Anesthesia Evaluation  Patient identified by MRN, date of birth, ID band Patient awake    Reviewed: Allergy & Precautions, NPO status , Patient's Chart, lab work & pertinent test results  Airway Mallampati: II  TM Distance: >3 FB   Mouth opening: Limited Mouth Opening Comment: PT only opens mouth 2 1/2 FB Dental  (+) Edentulous Upper, Upper Dentures   Pulmonary sleep apnea and Continuous Positive Airway Pressure Ventilation , former smoker,    breath sounds clear to auscultation       Cardiovascular hypertension, Pt. on medications and Pt. on home beta blockers + CAD, + Past MI and + Cardiac Stents   Rhythm:Regular Rate:Normal     Neuro/Psych negative neurological ROS     GI/Hepatic negative GI ROS, Neg liver ROS,   Endo/Other  negative endocrine ROS  Renal/GU negative Renal ROS     Musculoskeletal  (+) Arthritis ,   Abdominal   Peds  Hematology negative hematology ROS (+)   Anesthesia Other Findings   Reproductive/Obstetrics                           Lab Results  Component Value Date   WBC 14.1 (H) 03/07/2018   HGB 13.4 03/07/2018   HCT 41.6 03/07/2018   MCV 95.6 03/07/2018   PLT 189 03/07/2018   Lab Results  Component Value Date   CREATININE 0.99 03/07/2018   BUN 18 03/07/2018   NA 133 (L) 03/07/2018   K 3.9 03/07/2018   CL 98 03/07/2018   CO2 20 (L) 03/07/2018    Anesthesia Physical Anesthesia Plan  ASA: III and emergent  Anesthesia Plan: General   Post-op Pain Management:    Induction: Intravenous  PONV Risk Score and Plan: 2 and Ondansetron and Treatment may vary due to age or medical condition  Airway Management Planned: LMA  Additional Equipment:   Intra-op Plan:   Post-operative Plan: Extubation in OR  Informed Consent:   Plan Discussed with: CRNA, Surgeon and Anesthesiologist  Anesthesia Plan Comments: ( )       Anesthesia Quick Evaluation

## 2018-03-09 NOTE — Progress Notes (Signed)
Pt requesting RN to call, Lesleigh Noe, wife Called pt wife, update provided and educated on plan of care  Wife spoke to pt over the phone  Pt wife stated she will be here at 2pm, informed pt and pt family will call if change of time necessary Pt and wife understanding  Will continue to monitor

## 2018-03-09 NOTE — Progress Notes (Signed)
PROGRESS NOTE    Jeremy Sawyer  NLZ:[id number] DOB: October 19, 1934 DOA: 03/07/2018 PCP: Shon Baton, MD    Brief Narrative:  83 y.o. male with medical history significant CAD, HTN, who presented to the ED with complaints of  proggressive left hip pain for the past 2 weeks.  Patient denies prior falls.  He lives with his wife.  Patient was referred to an orthopedist Dr. Orvilla Fus (does not do surgeries) was diagnosed with a stress fracture.  Pain unrelieved by home pain medications hence presentation to the ED. Patient denies chest pain or dizziness, reports chronic intermittent dysuria over the past 2 years that is unchanged.  Fever or chills.  No vomiting or loose stools.  He has maintained good p.o. intake.  ED Course: Mild tachycardia to 107.  Sodium mildly low 133.  WBC 14.  Pelvic x-ray-displaced fracture of the left femoral neck.,  Intact total right pain arthroplasty.  Dr. Holly Bodily surgery on-call consulted recommended admission to hospitalist service, surgery likely Monday 03/09/18  Assessment & Plan:   Principal Problem:   Fracture of unspecified part of neck of left femur, initial encounter for closed fracture [facility]) Active Problems:   CAD (coronary artery disease)   HTN (hypertension)   Hypercholesterolemia  Left femur fracture - initial stress fracture progressed to displaced femoral neck fracture.  Denies falls.  Portable chest without acute abnormality.  EKG with nonspecific T wave abnormalities diffusely.  No chest pain.  History of CAD.  No CVA or pulmonary disease history. -Dr. Berenice Primas consulted plan for surgery 03/09/2018. Hip still broken - continue with analgesia as tolerated, improved with dilaudid  CAD Hx-reports stent placement 20 years ago.  Denies chest pain, no shortness of breath.  EKG with nonspecific T-wave changes. -Pending pharm med rec, Continue Lipitor, metoprolol, Norvasc. -No chest pain this MA  HTN - Cont Home meds- Norvasc, metop -Vitals  reviewed -Stable at this time  DVT prophylaxis: SCD's Code Status: Full Family Communication: Pt in room, family not at bedside Disposition Plan: Uncertain at this time  Consultants:   Orthopedic Surgery  Procedures:     Antimicrobials: Anti-infectives (From admission, onward)   Start     Dose/Rate Route Frequency Ordered Stop   03/09/18 0600  ceFAZolin (ANCEF) IVPB 2g/100 mL premix     2 g 200 mL/hr over 30 Minutes Intravenous On call to O.R. 03/08/18 1022 03/10/18 0559   03/08/18 1000  ceFAZolin (ANCEF) IVPB 2g/100 mL premix     2 g 200 mL/hr over 30 Minutes Intravenous On call to O.R. 03/08/18 0959 03/09/18 0559      Subjective: States pain better controlled  Objective: Vitals:   03/09/18 0600 03/09/18 0623 03/09/18 0733 03/09/18 1218  BP: (!) 146/74 (!) 146/74 (!) 141/79 (!) 151/71  Pulse: (!) 102 (!) 102 100 (!) 103  Resp: 18     Temp:  99 F (37.2 C)  99.3 F (37.4 C)  TempSrc: Oral Oral  Oral  SpO2: 95% 95%  96%  Weight:      Height:        Intake/Output Summary (Last 24 hours) at 03/09/2018 1521 Last data filed at 03/09/2018 1305 Gross per 24 hour  Intake 125 ml  Output 1000 ml  Net -875 ml   Filed Weights   03/07/18 2145 03/08/18 0038 03/09/18 0303  Weight: 83.9 kg 84 kg 85.6 kg    Examination: General exam: Awake, laying in bed, in nad Respiratory system: Normal respiratory effort, no wheezing Cardiovascular system: regular  rate, s1, s2  Data Reviewed: I have personally reviewed following labs and imaging studies  CBC: Recent Labs  Lab 03/07/18 2154  WBC 14.1*  NEUTROABS 11.8*  HGB 13.4  HCT 41.6  MCV 95.6  PLT 983   Basic Metabolic Panel: Recent Labs  Lab 03/07/18 2154  NA 133*  K 3.9  CL 98  CO2 20*  GLUCOSE 124*  BUN 18  CREATININE 0.99  CALCIUM 8.7*   GFR: Estimated Creatinine Clearance: 60.4 mL/min (by C-G formula based on SCr of 0.99 mg/dL). Liver Function Tests: No results for input(s): AST, ALT, ALKPHOS,  BILITOT, PROT, ALBUMIN in the last 168 hours. No results for input(s): LIPASE, AMYLASE in the last 168 hours. No results for input(s): AMMONIA in the last 168 hours. Coagulation Profile: Recent Labs  Lab 03/07/18 2257  INR 1.11   Cardiac Enzymes: No results for input(s): CKTOTAL, CKMB, CKMBINDEX, TROPONINI in the last 168 hours. BNP (last 3 results) No results for input(s): PROBNP in the last 8760 hours. HbA1C: No results for input(s): HGBA1C in the last 72 hours. CBG: No results for input(s): GLUCAP in the last 168 hours. Lipid Profile: No results for input(s): CHOL, HDL, LDLCALC, TRIG, CHOLHDL, LDLDIRECT in the last 72 hours. Thyroid Function Tests: No results for input(s): TSH, T4TOTAL, FREET4, T3FREE, THYROIDAB in the last 72 hours. Anemia Panel: No results for input(s): VITAMINB12, FOLATE, FERRITIN, TIBC, IRON, RETICCTPCT in the last 72 hours. Sepsis Labs: No results for input(s): PROCALCITON, LATICACIDVEN in the last 168 hours.  Recent Results (from the past 240 hour(s))  Surgical PCR screen     Status: None   Collection Time: 03/08/18  8:20 AM  Result Value Ref Range Status   MRSA, PCR NEGATIVE NEGATIVE Final   Staphylococcus aureus NEGATIVE NEGATIVE Final    Comment: (NOTE) The Xpert SA Assay (FDA approved for NASAL specimens in patients 58 years of age and older), is one component of a comprehensive surveillance program. It is not intended to diagnose infection nor to guide or monitor treatment. Performed at [facility], [place] [address], [place], [place] [address]      Radiology Studies: Dg Chest 1 View  Result Date: 03/07/2018 CLINICAL DATA:  Hip fxhip fracture EXAM: CHEST  1 VIEW COMPARISON:  None. FINDINGS: Normal mediastinum and cardiac silhouette. Normal pulmonary vasculature. Prominent vascular shadow along the superior RIGHT mediastinum unchanged. No evidence of effusion, infiltrate, or pneumothorax. No acute bony abnormality. IMPRESSION: No acute  cardiopulmonary process. Electronically Signed   By: Suzy Bouchard M.D.   On: 03/07/2018 22:58   Dg Hip Unilat W Or Wo Pelvis 2-3 Views Left  Result Date: 03/07/2018 CLINICAL DATA:  83 year old male with left hip pain. EXAM: DG HIP (WITH OR WITHOUT PELVIS) 2-3V LEFT COMPARISON:  Pelvic radiograph dated 05/15/2011 FINDINGS: There is a displaced fracture of the left femoral neck with superior migration of the femur and impaction on the lateral acetabular roof. The left femoral head is poorly visualized due to advanced osteopenia but appears to be within the acetabulum. There is a total right hip arthroplasty which appears intact. IMPRESSION: 1. Displaced fracture of the left femoral neck. 2. Total right hip arthroplasty appears intact. Electronically Signed   By: Anner Crete M.D.   On: 03/07/2018 22:58    Scheduled Meds: . amLODipine  10 mg Oral Daily  . atorvastatin  40 mg Oral Daily  . bupivacaine liposome  10 mL Infiltration Once  . finasteride  5 mg Oral Daily  .  metoprolol succinate  100 mg Oral Daily  . mupirocin ointment  1 application Nasal BID  . povidone-iodine  2 application Topical Once  . tamsulosin  0.4 mg Oral Daily   Continuous Infusions: .  ceFAZolin (ANCEF) IV    . tranexamic acid       LOS: 2 days   Marylu Lund, MD [facility] Pager On Amion  If 7PM-7AM, please contact night-coverage 03/09/2018, 3:21 PM

## 2018-03-09 NOTE — Progress Notes (Signed)
Subjective: Persistent left hip pain status post femoral neck fracture with known long-term arthritis   Objective: Vital signs in last 24 hours: Temp:  [98.4 F (36.9 C)-99.8 F (37.7 C)] 99.3 F (37.4 C) (12/09 1218) Pulse Rate:  [100-110] 103 (12/09 1218) Resp:  [16-24] 18 (12/09 0600) BP: (134-151)/(61-79) 151/71 (12/09 1218) SpO2:  [90 %-96 %] 96 % (12/09 1218) Weight:  [85.6 kg] 85.6 kg (12/09 0303)  Intake/Output from previous day: 12/08 0701 - 12/09 0700 In: 1113.3 [P.O.:580; I.V.:533.3] Out: 700 [Urine:700] Intake/Output this shift: Total I/O In: 25 [P.O.:25] Out: 300 [Urine:300]  Recent Labs    03/07/18 2154  HGB 13.4   Recent Labs    03/07/18 2154  WBC 14.1*  RBC 4.35  HCT 41.6  PLT 189   Recent Labs    03/07/18 2154  NA 133*  K 3.9  CL 98  CO2 20*  BUN 18  CREATININE 0.99  GLUCOSE 124*  CALCIUM 8.7*   Recent Labs    03/07/18 2257  INR 1.11    Neurologically intact ABD soft Neurovascular intact Sensation intact distally Intact pulses distally Dorsiflexion/Plantar flexion intact No cellulitis present Compartment soft      Assessment/Plan: 83 year old male with known long-term arthritis who fell and suffered a femoral neck fracture.  He will be taken to the operating room for left total hip replacement.  We will do it through an anterior approach.  Risks and benefits of surgery been discussed with the patient and his family previously.   Alta Corning 03/09/2018, 4:47 PM

## 2018-03-09 NOTE — Transfer of Care (Signed)
Immediate Anesthesia Transfer of Care Note  Patient: Jeremy Sawyer  Procedure(s) Performed: TOTAL HIP ARTHROPLASTY ANTERIOR APPROACH LEFT HIP Aspiration Fluid LEFT KNEE (Left )  Patient Location: PACU  Anesthesia Type:General  Level of Consciousness: oriented, sedated, drowsy, patient cooperative and responds to stimulation  Airway & Oxygen Therapy: Patient Spontanous Breathing and Patient connected to nasal cannula oxygen  Post-op Assessment: Report given to RN, Post -op Vital signs reviewed and stable and Patient moving all extremities X 4  Post vital signs: Reviewed and stable  Last Vitals:  Vitals Value Taken Time  BP 136/76 03/09/2018  7:42 PM  Temp 36.3 C 03/09/2018  7:42 PM  Pulse 103 03/09/2018  7:46 PM  Resp 22 03/09/2018  7:46 PM  SpO2 96 % 03/09/2018  7:46 PM  Vitals shown include unvalidated device data.  Last Pain:  Vitals:   03/09/18 1942  TempSrc:   PainSc: 0-No pain      Patients Stated Pain Goal: 0 (93/57/01 [id number])  Complications: No apparent anesthesia complications

## 2018-03-09 NOTE — Anesthesia Procedure Notes (Addendum)
Procedure Name: LMA Insertion Date/Time: 03/09/2018 5:36 PM Performed by: Laretta Alstrom, CRNA Pre-anesthesia Checklist: Patient identified, Emergency Drugs available, Suction available, Patient being monitored and Timeout performed Patient Re-evaluated:Patient Re-evaluated prior to induction Oxygen Delivery Method: Circle system utilized Preoxygenation: Pre-oxygenation with 100% oxygen Induction Type: IV induction Ventilation: Mask ventilation without difficulty LMA: LMA inserted LMA Size: 5.0 Number of attempts: 1 Placement Confirmation: positive ETCO2 and breath sounds checked- equal and bilateral Tube secured with: Tape Dental Injury: Teeth and Oropharynx as per pre-operative assessment

## 2018-03-09 NOTE — Progress Notes (Signed)
MD aware no lab draws ordered for pt, no new orders at this time  Will continue to monitor

## 2018-03-10 ENCOUNTER — Encounter (HOSPITAL_COMMUNITY): Payer: Self-pay | Admitting: Orthopedic Surgery

## 2018-03-10 DIAGNOSIS — T148XXA Other injury of unspecified body region, initial encounter: Secondary | ICD-10-CM

## 2018-03-10 LAB — URINALYSIS, ROUTINE W REFLEX MICROSCOPIC
Bilirubin Urine: NEGATIVE
Glucose, UA: NEGATIVE mg/dL
Hgb urine dipstick: NEGATIVE
Ketones, ur: NEGATIVE mg/dL
Leukocytes, UA: NEGATIVE
Nitrite: NEGATIVE
PROTEIN: NEGATIVE mg/dL
Specific Gravity, Urine: 1.028 (ref 1.005–1.030)
pH: 5 (ref 5.0–8.0)

## 2018-03-10 LAB — BASIC METABOLIC PANEL
Anion gap: 10 (ref 5–15)
BUN: 16 mg/dL (ref 8–23)
CHLORIDE: 101 mmol/L (ref 98–111)
CO2: 26 mmol/L (ref 22–32)
Calcium: 8.1 mg/dL — ABNORMAL LOW (ref 8.9–10.3)
Creatinine, Ser: 0.75 mg/dL (ref 0.61–1.24)
GFR calc Af Amer: >60 mL/min (ref >60)
GFR calc non Af Amer: >60 mL/min (ref >60)
Glucose, Bld: 169 mg/dL — ABNORMAL HIGH (ref 70–99)
Potassium: 3.6 mmol/L (ref 3.5–5.1)
Sodium: 137 mmol/L (ref 135–145)

## 2018-03-10 LAB — CBC
HCT: 33.3 % — ABNORMAL LOW (ref 39.0–52.0)
Hemoglobin: 11.1 g/dL — ABNORMAL LOW (ref 13.0–17.0)
MCH: 30.8 pg (ref 26.0–34.0)
MCHC: 33.3 g/dL (ref 30.0–36.0)
MCV: 92.5 fL (ref 80.0–100.0)
Platelets: 189 10*3/uL (ref 150–400)
RBC: 3.6 MIL/uL — AB (ref 4.22–5.81)
RDW: 12.9 % (ref 11.5–15.5)
WBC: 11.8 10*3/uL — ABNORMAL HIGH (ref 4.0–10.5)
nRBC: 0 % (ref 0.0–0.2)

## 2018-03-10 NOTE — Anesthesia Postprocedure Evaluation (Signed)
Anesthesia Post Note  Patient: Jeremy Sawyer  Procedure(s) Performed: TOTAL HIP ARTHROPLASTY ANTERIOR APPROACH LEFT HIP Aspiration Fluid LEFT KNEE (Left )     Patient location during evaluation: PACU Anesthesia Type: General Level of consciousness: awake and alert Pain management: pain level controlled Vital Signs Assessment: post-procedure vital signs reviewed and stable Respiratory status: spontaneous breathing, nonlabored ventilation, respiratory function stable and patient connected to nasal cannula oxygen Cardiovascular status: blood pressure returned to baseline and stable Postop Assessment: no apparent nausea or vomiting Anesthetic complications: no    Last Vitals:  Vitals:   03/10/18 0132 03/10/18 0356  BP: 118/67 120/74  Pulse: 95 90  Resp: 16 18  Temp: 36.8 C 36.9 C  SpO2: 96% 96%    Last Pain:  Vitals:   03/10/18 0630  TempSrc:   PainSc: 0-No pain                 Vanissa Strength P Maddock Finigan

## 2018-03-10 NOTE — Evaluation (Signed)
Physical Therapy Evaluation Patient Details Name: Jeremy Sawyer MRN: [id number] DOB: 1934/08/24 Today's Date: 03/10/2018   History of Present Illness  83 y.o. male with medical history significant CAD, HTN, who presented to the ED with complaints of  proggressive left hip pain for the past 2 weeks.Patient was referred to an orthopedist Dr. Orvilla Fus (does not do surgeries) was diagnosed with a stress fracture.  Pain unrelieved by home pain medications hence presentation to the ED.dx with completely displaced L femoral neck fracture. s/p L THA direct anterior approach 03/09/18  Clinical Impression  PTA pt lived with wife in a [facility] independent living [place], which is completely handicap accessible. Pt was driving and independent in all iADLs, taking care of his wife who is s/p back surgery. Pt currently limited by L hip pain and decreased L LE strength and endurance. Pt is modA for bed mobility, and min A for transfers from bed, recliner and BSC, min A for ambulation of 3 feet with RW. PT recommends SNF level rehab at discharge and pt prefers SNF at [facility]. PT will continue to follow acutely.     Follow Up Recommendations SNF(resident of [facility] would like to go to SNF there)    Equipment Recommendations  None recommended by PT       Precautions / Restrictions Precautions Precautions: None Restrictions Weight Bearing Restrictions: Yes LLE Weight Bearing: Weight bearing as tolerated      Mobility  Bed Mobility Overal bed mobility: Needs Assistance Bed Mobility: Supine to Sit     Supine to sit: Mod assist     General bed mobility comments: modA for management of L LE off bed and for bringing trunk to upright   Transfers Overall transfer level: Needs assistance Equipment used: Rolling walker (2 wheeled) Transfers: Sit to/from Stand Sit to Stand: Min assist         General transfer comment: min A for power up and steadying in RW, vc for hand placement and foot  placement able to rise withminA from elevated bed, recliner and BSC   Ambulation/Gait Ambulation/Gait assistance: Min assist Gait Distance (Feet): 3 Feet(3' x 3 ) Assistive device: Rolling walker (2 wheeled) Gait Pattern/deviations: Step-to pattern;Decreased weight shift to right;Shuffle;Antalgic Gait velocity: slowed  Gait velocity interpretation: <1.31 ft/sec, indicative of household ambulator General Gait Details: shuffling lateral steps from bed to recliner and recliner to [facility] and back       Balance Overall balance assessment: Needs assistance Sitting-balance support: No upper extremity supported;Feet supported Sitting balance-[name]: Good     Standing balance support: Bilateral upper extremity supported Standing balance-[name]: Poor                               Pertinent Vitals/Pain Pain Assessment: 0-10 Pain Location: L hip and knee Pain Intervention(s): Limited activity within patient's tolerance;Monitored during session;Repositioned    Home Living Family/patient expects to be discharged to:: Private residence(Independent living, [facility] ) Living Arrangements: Spouse/significant other Available Help at Discharge: Family;Available 24 hours/day Type of Home: Independent living facility Home Access: Level entry     Home Layout: One level Home Equipment: Walker - 2 wheels;Grab bars - tub/shower;Grab bars - toilet;Shower seat - built in      Prior Function Level of Independence: Independent         Comments: driving and caring for wife s/p back surgery        Extremity/Trunk Assessment   Upper  Extremity Assessment Upper Extremity Assessment: Overall WFL for tasks assessed    Lower Extremity Assessment Lower Extremity Assessment: LLE deficits/detail LLE Deficits / Details: L knee ROM limiting full extension and flexion, ankle ROM WFL  LLE: Unable to fully assess due to pain    Cervical / Trunk Assessment Cervical / Trunk  Assessment: Kyphotic  Communication   Communication: No difficulties  Cognition Arousal/Alertness: Awake/alert Behavior During Therapy: WFL for tasks assessed/performed Overall Cognitive Status: Within Functional Limits for tasks assessed                                 General Comments: Pt disappointed with limited R LE strength after surgery.       General Comments General comments (skin integrity, edema, etc.): Pt dressing in tact with minor bleed through. surgical area is warm to the touch and slightly edematous.         Assessment/Plan    PT Assessment Patient needs continued PT services  PT Problem List Decreased strength;Decreased range of motion;Decreased activity tolerance;Decreased balance;Decreased mobility;Pain       PT Treatment Interventions DME instruction;Gait training;Functional mobility training;Therapeutic activities;Therapeutic exercise;Balance training;Patient/family education    PT Goals (Current goals can be found in the Care Plan section)  Acute Rehab PT Goals Patient Stated Goal: go home  PT Goal Formulation: With patient Time For Goal Achievement: 03/24/18 Potential to Achieve Goals: Fair    Frequency Min 3X/week    AM-PAC PT "6 Clicks" Mobility  Outcome Measure Help needed turning from your back to your side while in a flat bed without using bedrails?: A Little Help needed moving from lying on your back to sitting on the side of a flat bed without using bedrails?: A Little Help needed moving to and from a bed to a chair (including a wheelchair)?: A Little Help needed standing up from a chair using your arms (e.g., wheelchair or bedside chair)?: A Little Help needed to walk in hospital room?: Total Help needed climbing 3-5 steps with a railing? : Total 6 Click Score: 14    End of Session Equipment Utilized During Treatment: Gait belt Activity Tolerance: Patient limited by pain Patient left: in chair;with call bell/phone within  reach;with chair alarm set Nurse Communication: Mobility status;Weight bearing status PT Visit Diagnosis: Other abnormalities of gait and mobility (R26.89);Unsteadiness on feet (R26.81);Muscle weakness (generalized) (M62.81);Difficulty in walking, not elsewhere classified (R26.2);Pain Pain - Right/Left: Left Pain - part of body: Hip    Time: 1405-1500 PT Time Calculation (min) (ACUTE ONLY): 55 min   Charges:   PT Evaluation $PT Eval Moderate Complexity: 1 Mod PT Treatments $Gait Training: 8-22 mins $Therapeutic Activity: 23-37 mins        Reed Dady B. Migdalia Dk PT, DPT Acute Rehabilitation Services Pager [phone] Office [phone]   [employer] 03/10/2018, 3:29 PM

## 2018-03-10 NOTE — Progress Notes (Signed)
PROGRESS NOTE    Jeremy Sawyer  CLE:[id number] DOB: [birth year] DOA: 03/07/2018 PCP: Shon Baton, MD    Brief Narrative:  83 y.o. male with medical history significant CAD, HTN, who presented to the ED with complaints of  proggressive left hip pain for the past 2 weeks.  Patient denies prior falls.  He lives with his wife.  Patient was referred to an orthopedist Dr. Orvilla Fus (does not do surgeries) was diagnosed with a stress fracture.  Pain unrelieved by home pain medications hence presentation to the ED. Patient denies chest pain or dizziness, reports chronic intermittent dysuria over the past 2 years that is unchanged.  Fever or chills.  No vomiting or loose stools.  He has maintained good p.o. intake.  ED Course: Mild tachycardia to 107.  Sodium mildly low 133.  WBC 14.  Pelvic x-ray-displaced fracture of the left femoral neck.,  Intact total right pain arthroplasty.  Dr. Holly Bodily surgery on-call consulted recommended admission to hospitalist service, surgery likely Monday 03/09/18  Assessment & Plan:   Principal Problem:   Fracture of unspecified part of neck of left femur, initial encounter for closed fracture [facility]) Active Problems:   CAD (coronary artery disease)   HTN (hypertension)   Hypercholesterolemia  Left femur fracture - initial stress fracture progressed to displaced femoral neck fracture.  Denies falls.  Portable chest without acute abnormality.  EKG with nonspecific T wave abnormalities diffusely.  No chest pain.  History of CAD.  No CVA or pulmonary disease history. -Dr. Berenice Primas consulted. Pt underwent surgery 12/9 - PT/OT recommends SNF. SW consulted  CAD Hx-reports stent placement 20 years ago.  Denies chest pain, no shortness of breath.  EKG with nonspecific T-wave changes. -Pending pharm med rec, Continue Lipitor, metoprolol, Norvasc. -remains without chest pains  HTN - Cont Home meds- Norvasc, metop -Vitals reviewed -remains stable at this  time  Bladder outlet obstruction -Just under 500cc on bladder scan this AM s/p I/o cath -Repeat bladder scan in 6hrs per protocol. If again >300cc on bladder scan, then would place indwelling foley cath  DVT prophylaxis: SCD's Code Status: Full Family Communication: Pt in room, family not at bedside Disposition Plan: Uncertain at this time  Consultants:   Orthopedic Surgery  Procedures:     Antimicrobials: Anti-infectives (From admission, onward)   Start     Dose/Rate Route Frequency Ordered Stop   03/09/18 2200  ceFAZolin (ANCEF) IVPB 2g/100 mL premix     2 g 200 mL/hr over 30 Minutes Intravenous Every 8 hours 03/09/18 2131 03/11/18 2159   03/09/18 0600  ceFAZolin (ANCEF) IVPB 2g/100 mL premix     2 g 200 mL/hr over 30 Minutes Intravenous On call to O.R. 03/08/18 1022 03/09/18 1739   03/08/18 1000  ceFAZolin (ANCEF) IVPB 2g/100 mL premix     2 g 200 mL/hr over 30 Minutes Intravenous On call to O.R. 03/08/18 0959 03/09/18 0559      Subjective: Without complaints this AM  Objective: Vitals:   03/10/18 0132 03/10/18 0356 03/10/18 0854 03/10/18 1727  BP: 118/67 120/74 111/66 (!) 111/57  Pulse: 95 90 95 84  Resp: 16 18 16 17   Temp: 98.2 F (36.8 C) 98.4 F (36.9 C) 98.1 F (36.7 C) 98.2 F (36.8 C)  TempSrc: Oral Oral Oral Oral  SpO2: 96% 96% 94% 93%  Weight:      Height:        Intake/Output Summary (Last 24 hours) at 03/10/2018 1854 Last data filed at 03/10/2018 1200  Gross per 24 hour  Intake 980 ml  Output 440 ml  Net 540 ml   Filed Weights   03/07/18 2145 03/08/18 0038 03/09/18 0303  Weight: 83.9 kg 84 kg 85.6 kg    Examination: General exam: Conversant, in no acute distress Respiratory system: normal chest rise, clear, no audible wheezing Cardiovascular system: regular rhythm, s1-s2 Gastrointestinal system: Nondistended, nontender, pos BS Central nervous system: No seizures, no tremors Extremities: No cyanosis, no joint deformities Skin: No  rashes, no pallor Psychiatry: Affect normal // no auditory hallucinations   Data Reviewed: I have personally reviewed following labs and imaging studies  CBC: Recent Labs  Lab 03/07/18 2154 03/10/18 0234  WBC 14.1* 11.8*  NEUTROABS 11.8*  --   HGB 13.4 11.1*  HCT 41.6 33.3*  MCV 95.6 92.5  PLT 189 371   Basic Metabolic Panel: Recent Labs  Lab 03/07/18 2154 03/10/18 0234  NA 133* 137  K 3.9 3.6  CL 98 101  CO2 20* 26  GLUCOSE 124* 169*  BUN 18 16  CREATININE 0.99 0.75  CALCIUM 8.7* 8.1*   GFR: Estimated Creatinine Clearance: 74.8 mL/min (by C-G formula based on SCr of 0.75 mg/dL). Liver Function Tests: No results for input(s): AST, ALT, ALKPHOS, BILITOT, PROT, ALBUMIN in the last 168 hours. No results for input(s): LIPASE, AMYLASE in the last 168 hours. No results for input(s): AMMONIA in the last 168 hours. Coagulation Profile: Recent Labs  Lab 03/07/18 2257  INR 1.11   Cardiac Enzymes: No results for input(s): CKTOTAL, CKMB, CKMBINDEX, TROPONINI in the last 168 hours. BNP (last 3 results) No results for input(s): PROBNP in the last 8760 hours. HbA1C: No results for input(s): HGBA1C in the last 72 hours. CBG: No results for input(s): GLUCAP in the last 168 hours. Lipid Profile: No results for input(s): CHOL, HDL, LDLCALC, TRIG, CHOLHDL, LDLDIRECT in the last 72 hours. Thyroid Function Tests: No results for input(s): TSH, T4TOTAL, FREET4, T3FREE, THYROIDAB in the last 72 hours. Anemia Panel: No results for input(s): VITAMINB12, FOLATE, FERRITIN, TIBC, IRON, RETICCTPCT in the last 72 hours. Sepsis Labs: No results for input(s): PROCALCITON, LATICACIDVEN in the last 168 hours.  Recent Results (from the past 240 hour(s))  Surgical PCR screen     Status: None   Collection Time: 03/08/18  8:20 AM  Result Value Ref Range Status   MRSA, PCR NEGATIVE NEGATIVE Final   Staphylococcus aureus NEGATIVE NEGATIVE Final    Comment: (NOTE) The Xpert SA Assay (FDA  approved for NASAL specimens in patients 59 years of age and older), is one component of a comprehensive surveillance program. It is not intended to diagnose infection nor to guide or monitor treatment. Performed at [facility], [place] [address], [place], [place] [address]   Aerobic/Anaerobic Culture (surgical/deep wound)     Status: None (Preliminary result)   Collection Time: 03/09/18  6:01 PM  Result Value Ref Range Status   Specimen Description SYNOVIAL KNEE  Final   Special Requests NONE  Final   Gram Stain   Final    ABUNDANT WBC PRESENT,BOTH PMN AND MONONUCLEAR NO ORGANISMS SEEN    Culture   Final    NO GROWTH < 24 HOURS Performed at [facility], [place] [address], [place], [place] [address]    Report Status PENDING  Incomplete     Radiology Studies: Dg C-arm 1-60 Min  Result Date: 03/09/2018 CLINICAL DATA:  Hip fracture EXAM: DG C-ARM 61-120 MIN; OPERATIVE LEFT HIP WITH  PELVIS COMPARISON:  03/07/2018 FINDINGS: Four low resolution intraoperative spot views of the left hip. Total fluoroscopy time was 18 seconds. Prior right hip replacement. Interval left hip replacement with normal alignment of the hardware. Pubic symphysis and rami are intact. IMPRESSION: Intraoperative fluoroscopic assistance provided during left hip replacement Electronically Signed   By: Donavan Foil M.D.   On: 03/09/2018 20:25   Dg Hip Operative Unilat W Or W/o Pelvis Left  Result Date: 03/09/2018 CLINICAL DATA:  Hip fracture EXAM: DG C-ARM 61-120 MIN; OPERATIVE LEFT HIP WITH PELVIS COMPARISON:  03/07/2018 FINDINGS: Four low resolution intraoperative spot views of the left hip. Total fluoroscopy time was 18 seconds. Prior right hip replacement. Interval left hip replacement with normal alignment of the hardware. Pubic symphysis and rami are intact. IMPRESSION: Intraoperative fluoroscopic assistance provided during left hip replacement Electronically Signed   By: Donavan Foil M.D.   On: 03/09/2018  20:25    Scheduled Meds: . acetaminophen  500 mg Oral Q6H  . amLODipine  10 mg Oral Daily  . aspirin EC  325 mg Oral BID PC  . atorvastatin  40 mg Oral Daily  . docusate sodium  100 mg Oral BID  . finasteride  5 mg Oral Daily  . metoprolol succinate  100 mg Oral Daily  . mupirocin ointment  1 application Nasal BID  . tamsulosin  0.4 mg Oral Daily   Continuous Infusions: . sodium chloride    .  ceFAZolin (ANCEF) IV 2 g (03/10/18 1524)  . methocarbamol (ROBAXIN) IV       LOS: 3 days   Marylu Lund, MD [facility] Pager On Amion  If 7PM-7AM, please contact night-coverage 03/10/2018, 6:54 PM

## 2018-03-10 NOTE — Progress Notes (Signed)
Subjective: 1 Day Post-Op Procedure(s) (LRB): TOTAL HIP ARTHROPLASTY ANTERIOR APPROACH LEFT HIP Aspiration Fluid LEFT KNEE (Left) Patient reports pain as moderate. Patient sitting in bed eating breakfast.   Objective: Vital signs in last 24 hours: Temp:  [97.4 F (36.3 C)-99.3 F (37.4 C)] 98.4 F (36.9 C) (12/10 0356) Pulse Rate:  [90-108] 90 (12/10 0356) Resp:  [16-32] 18 (12/10 0356) BP: (112-151)/(66-76) 120/74 (12/10 0356) SpO2:  [90 %-96 %] 96 % (12/10 0356)  Intake/Output from previous day: 12/09 0701 - 12/10 0700 In: [place] [P.O.:265; I.V.:1300; IV Piggyback:200] Out: 1550 [Urine:1150; Blood:400] Intake/Output this shift: No intake/output data recorded.  Recent Labs    03/07/18 2154 03/10/18 0234  HGB 13.4 11.1*   Recent Labs    03/07/18 2154 03/10/18 0234  WBC 14.1* 11.8*  RBC 4.35 3.60*  HCT 41.6 33.3*  PLT 189 189   Recent Labs    03/07/18 2154 03/10/18 0234  NA 133* 137  K 3.9 3.6  CL 98 101  CO2 20* 26  BUN 18 16  CREATININE 0.99 0.75  GLUCOSE 124* 169*  CALCIUM 8.7* 8.1*   Recent Labs    03/07/18 2257  INR 1.11  Left hip exam:  Neurovascular intact Sensation intact distally Intact pulses distally Dorsiflexion/Plantar flexion intact Incision: dressing C/D/I Compartment soft   Assessment/Plan: 1 Day Post-Op Procedure(s) (LRB): TOTAL HIP ARTHROPLASTY ANTERIOR APPROACH LEFT HIP Aspiration Fluid LEFT KNEE (Left)  Plan: Up with physical therapy weightbearing as tolerated on left with no hip precautions. Continue on IV Ancef due to previous preop history of multiple cortisone injections which may increase infection risk. We will need to go back to skilled nursing facility. Aspirin 325 mg twice daily for DVT prophylaxis 1 month postop.    Erlene Senters 03/10/2018, 9:52 AM

## 2018-03-10 NOTE — Care Management Important Message (Signed)
Important Message  Patient Details  Name: Jeremy Sawyer MRN: [id number] Date of Birth: [birth year]   Medicare Important Message Given:  Yes    Catherin Doorn 03/10/2018, 3:35 PM

## 2018-03-11 DIAGNOSIS — I10 Essential (primary) hypertension: Secondary | ICD-10-CM

## 2018-03-11 DIAGNOSIS — N400 Enlarged prostate without lower urinary tract symptoms: Secondary | ICD-10-CM | POA: Diagnosis present

## 2018-03-11 DIAGNOSIS — S72002A Fracture of unspecified part of neck of left femur, initial encounter for closed fracture: Principal | ICD-10-CM

## 2018-03-11 LAB — CBC
HCT: 29.3 % — ABNORMAL LOW (ref 39.0–52.0)
Hemoglobin: 9.8 g/dL — ABNORMAL LOW (ref 13.0–17.0)
MCH: 31.1 pg (ref 26.0–34.0)
MCHC: 33.4 g/dL (ref 30.0–36.0)
MCV: 93 fL (ref 80.0–100.0)
Platelets: 198 10*3/uL (ref 150–400)
RBC: 3.15 MIL/uL — ABNORMAL LOW (ref 4.22–5.81)
RDW: 13 % (ref 11.5–15.5)
WBC: 13 10*3/uL — ABNORMAL HIGH (ref 4.0–10.5)
nRBC: 0 % (ref 0.0–0.2)

## 2018-03-11 NOTE — Brief Op Note (Signed)
03/09/2018  8:14 AM  PATIENT:  Paulette Blanch  83 y.o. male  PRE-OPERATIVE DIAGNOSIS:  LEFT FEMORAL NECK FRACTURE  POST-OPERATIVE DIAGNOSIS:  LEFT FEMORAL NECK FRACTURE  PROCEDURE:  Procedure(s): TOTAL HIP ARTHROPLASTY ANTERIOR APPROACH LEFT HIP Aspiration Fluid LEFT KNEE (Left)  SURGEON:  Surgeon(s) and Role:    Dorna Leitz, MD - Primary  PHYSICIAN ASSISTANT:   ASSISTANTS: bethune   ANESTHESIA:   general  EBL:  400 mL   BLOOD ADMINISTERED:none  DRAINS: none   LOCAL MEDICATIONS USED:  MARCAINE     SPECIMEN:  No Specimen  DISPOSITION OF SPECIMEN:  N/A  COUNTS:  YES  TOURNIQUET:  * No tourniquets in log *  DICTATION: .Other Dictation: Dictation Number [id number]  PLAN OF CARE: Admit to inpatient   PATIENT DISPOSITION:  PACU - hemodynamically stable.   Delay start of Pharmacological VTE agent (>24hrs) due to surgical blood loss or risk of bleeding: no

## 2018-03-11 NOTE — Progress Notes (Signed)
Subjective: 2 Days Post-Op Procedure(s) (LRB): TOTAL HIP ARTHROPLASTY ANTERIOR APPROACH LEFT HIP Aspiration Fluid LEFT KNEE (Left) Patient reports pain as mild.  Denies left hip pain.  Left knee is feeling better as well.  He is sitting up in the chair eating breakfast.  Foley catheter is still in place.  Objective: Vital signs in last 24 hours: Temp:  [97.7 F (36.5 C)-98.2 F (36.8 C)] 97.7 F (36.5 C) (12/11 0355) Pulse Rate:  [80-84] 80 (12/11 0355) Resp:  [16-17] 16 (12/11 0355) BP: (108-111)/(57-59) 108/59 (12/11 0355) SpO2:  [92 %-93 %] 92 % (12/11 0355)  Intake/Output from previous day: 12/10 0701 - 12/11 0700 In: 240 [P.O.:240] Out: 125 [Urine:125] Intake/Output this shift: Total I/O In: 360 [P.O.:360] Out: -   Recent Labs    03/10/18 0234 03/11/18 0230  HGB 11.1* 9.8*   Recent Labs    03/10/18 0234 03/11/18 0230  WBC 11.8* 13.0*  RBC 3.60* 3.15*  HCT 33.3* 29.3*  PLT 189 198   Recent Labs    03/10/18 0234  NA 137  K 3.6  CL 101  CO2 26  BUN 16  CREATININE 0.75  GLUCOSE 169*  CALCIUM 8.1*   No results for input(s): LABPT, INR in the last 72 hours. Left hip exam: Neurovascular intact Sensation intact distally Intact pulses distally Dorsiflexion/Plantar flexion intact Incision: dressing C/D/I Compartment soft Left knee exam has decreased swelling.  No warmth.  Good range of motion without pain. Assessment/Plan: 2 Days Post-Op Procedure(s) (LRB): TOTAL HIP ARTHROPLASTY ANTERIOR APPROACH LEFT HIP Aspiration Fluid LEFT KNEE (Left) Effusion left knee improved after aspiration.  Left knee cultures negative.  Could not find results of synovial fluid analysis. Plan: Weight-bear as tolerated on left without hip precautions. Aspirin 325 mg twice daily x1 month postop for DVT prophylaxis.  Take with food. Would recommend discontinuing the Foley.  Can go to skilled nursing facility when medically stable.  Will need follow-up with Dr. Berenice Primas in 2  weeks.    Erlene Senters 03/11/2018, 10:06 AM

## 2018-03-11 NOTE — Progress Notes (Addendum)
Patient has been bladder scanned and reading was 368. Based on orders and MD Chiu's notes, an indwelling cath has been placed. Before cathing, patient did void on his own 125cc.

## 2018-03-11 NOTE — Op Note (Addendum)
NAME: Jeremy Sawyer, Jeremy Sawyer MEDICAL RECORD GG:[id number] ACCOUNT [id number] DATE OF BIRTH:01/09/1935 FACILITY: [facility] LOCATION: [facility] PHYSICIAN:Oree Hislop L. Sherrise Liberto, MD  OPERATIVE REPORT  DATE OF PROCEDURE:  03/09/2018  PREOPERATIVE DIAGNOSIS:  Femoral neck fracture with displacement and severe pain with ambulation, left.  POSTOPERATIVE DIAGNOSIS:  1.Femoral neck fracture with displacement and severe pain with ambulation, left. 2. Swollen l knee. PROCEDURE:   1.  Left total hip replacement with a Corail stem size 12, 54 mm sector Gription cup, neutral acetabular liner, and a +8 mm metal hip ball.   2.  Interpretation of multiple intraoperative fluoroscopic images. 3.Aspiration left knee for 90 cc of cloudy fluid. SURGEON:  Dorna Leitz, MD  ASSISTANT:  Gaspar Skeeters PA-C, who was present for the entire case and assisted with retraction, manipulation of tissues, and closing to minimize OR time.  BRIEF HISTORY:  The patient is an 83 year old male with a long history of significant complaints of left hip pain.  He had been seen and his office several weeks ago with severe pain, concern for collapse of his femoral head.  He continued to have  significant pain in the hip and all of a sudden began having severe unrelenting pain in the hip and because of that presented to the emergency room.  X-rays at that time showed that he had a femoral neck fracture and concern of loss of joint space and  was not quite sure that the femoral head was still in the acetabulum.  We talked about treatment options, but felt that total hip replacement was going to be necessary and after failure of all conservative care and noting a femoral neck fracture, he was  brought to the operating room for total hip replacement.  DESCRIPTION OF PROCEDURE:  The patient brought to the operating room after adequate anesthesia obtained with a general anesthetic, the patient was brought to the operating table. The left knee with  significantly swollen and after this we aspirated the left knee for 90 cc of straw-colored fluid.  There was a little bit of cloudiness the fluids we sent it off for evaluation. Left hip was prepped and draped in usual sterile fashion.  Following this,  an incision was made for an anterior approach to the hip subcutaneous tissue down to the level of the capsule.  The capsule was fragmented.  There was significant blood that was encountered at this point.  The best we could we opened, identified and  tagged what I felt was the capsule.  At that point, a provisional neck cut was made with some traction on the hip.  This bone was removed and we got into the acetabulum.  I really could not find a femoral head.  I was looking posterior, anteriorly and I  really could not get up superior.  At this point, we felt that there was no femoral head that was in the acetabulum.  At this point, the retractors were put in place and the acetabulum was evaluated.  There were 2 significant indentations in the  posterior acetabulum where it looked like the femur had been lodged into the acetabulum.  We sequentially reamed the acetabulum up to the 53 mm and then took bone graft and packed in this area.  I used a sector Gription cup just in case my impaction was  not what I wanted it to be.  I really got very nice fit with the sector Gription cup.  I did put a single screw in just to hold that area  and then we put in a neutral liner.  At this point, the attention was turned towards the stem side.  The hip was  externally rotated, extended and adducted.  We used a cookie cutter to open the area followed by a chili pepper followed by sequential rasping up to a level of 12.  Once this 12 rasp was put in place, we put a +0 ball and a reduction.  At that point, the  fit of the stem looked to be good by x-ray and we were just a little short on the pelvis view.  At this point, we dislocated the hip.  We retrialed the 12 and that seemed  to be the appropriate size.  We put a 12 in and then trialed a +8 ball that seemed  to give [place] symmetric leg lengths and excellent stability through stability testing.  At that point, the wounds were irrigated and suctioned dry.  We then put a +8 ball on there and reduced the hip.  At this point, the wounds were irrigated and suctioned  dry.  I closed the capsule at this point, followed by closure of the tensor fascia with 0 Vicryl running.  The capsule was closed with #1 Vicryl running.  The skin was then closed with 0 and 2-0 Vicryl and 3-0 Monocryl subcuticular.  Multiple  fluoroscopic images were taken as outlined.  Gaspar Skeeters was present throughout the entire case and assisted with manipulation, retraction of tissues and closure to minimize OR time.  At this point, after closure a sterile compressive dressing applied.   The patient was taken to recovery and was noted to be in satisfactory condition.  Estimated blood loss for procedure was 400 mL, but the final can be gotten from the anesthetic record.  TN/NUANCE  D:03/11/2018 T:03/11/2018 JOB:[id number]

## 2018-03-11 NOTE — Plan of Care (Signed)
  Problem: Clinical Measurements: Goal: Will remain free from infection Outcome: Progressing   Problem: Activity: Goal: Risk for activity intolerance will decrease Outcome: Progressing   Problem: Safety: Goal: Ability to remain free from injury will improve Outcome: Progressing   Problem: Skin Integrity: Goal: Risk for impaired skin integrity will decrease Outcome: Progressing   

## 2018-03-11 NOTE — Plan of Care (Signed)
  Problem: Education: Goal: Knowledge of General Education information will improve Description Including pain rating scale, medication(s)/side effects and non-pharmacologic comfort measures Outcome: Progressing   Problem: Clinical Measurements: Goal: Ability to maintain clinical measurements within normal limits will improve Outcome: Progressing Goal: Will remain free from infection Outcome: Progressing Note:  Pt has shown no signs of infection during my care.    Problem: Safety: Goal: Ability to remain free from injury will improve Outcome: Progressing Note:  Pt has remained safe and free from falls during my care.

## 2018-03-11 NOTE — Progress Notes (Signed)
Physical Therapy Treatment Patient Details Name: Jeremy Sawyer MRN: [id number] DOB: July 29, 1934 Today's Date: 03/11/2018    History of Present Illness 83 y.o. male with medical history significant CAD, HTN, who presented to the ED with complaints of  proggressive left hip pain for the past 2 weeks.Patient was referred to an orthopedist Dr. Orvilla Fus (does not do surgeries) was diagnosed with a stress fracture.  Pain unrelieved by home pain medications hence presentation to the ED.dx with completely displaced L femoral neck fracture. s/p L THA direct anterior approach 03/09/18    PT Comments    Pt asleep on entry, once awake agreeable to get up with therapy to ambulate before breakfast. Pt continues to be limited in safe mobility by increased L LE pain, especially in thigh and associated decrease in strength and endurance. Pt requires minA for bed mobility, transfers and ambulation of 30 feet. Pt with c/o of numbness and tingling in bilateral 4th and 5th digits of the had. Pt given exercises to improve shoulder retraction. D/c plans remain appropriate at this time due to decreased mobility. PT will continue to follow acutely.    Follow Up Recommendations  SNF(resident of [facility] would like to go to SNF there)     Equipment Recommendations  None recommended by PT       Precautions / Restrictions Precautions Precautions: None Restrictions Weight Bearing Restrictions: Yes LLE Weight Bearing: Weight bearing as tolerated    Mobility  Bed Mobility Overal bed mobility: Needs Assistance Bed Mobility: Supine to Sit     Supine to sit: Min assist     General bed mobility comments: min, A for management of L LE off bed and for bringing trunk to upright, vc for hand placement   Transfers Overall transfer level: Needs assistance Equipment used: Rolling walker (2 wheeled) Transfers: Sit to/from Stand Sit to Stand: Min assist         General transfer comment: min A for power up  and steadying in RW, vc for hand placement   Ambulation/Gait Ambulation/Gait assistance: Min assist Gait Distance (Feet): 30 Feet Assistive device: Rolling walker (2 wheeled) Gait Pattern/deviations: Step-to pattern;Decreased weight shift to right;Shuffle;Antalgic Gait velocity: slowed  Gait velocity interpretation: <1.31 ft/sec, indicative of household ambulator General Gait Details: min A for slow, shuffling gait with decreased weightshift to L, vc for upright posture     Balance Overall balance assessment: Needs assistance Sitting-balance support: No upper extremity supported;Feet supported Sitting balance-[name]: Good     Standing balance support: Bilateral upper extremity supported Standing balance-[name]: Poor                              Cognition Arousal/Alertness: Awake/alert Behavior During Therapy: WFL for tasks assessed/performed Overall Cognitive Status: Within Functional Limits for tasks assessed                                        Exercises General Exercises - Upper Extremity Shoulder Horizontal ABduction: AROM;Both;5 reps;Theraband Theraband Level (Shoulder Horizontal Abduction): Level 1 (Yellow)    General Comments General comments (skin integrity, edema, etc.): Edema in L LE is improved. After ambulation, pt with complaints of numbness and tingling in bilateral 4th and 5th digits. Will continue to work with patient on decreased forward flexion with ambulation using RW, and work on shoulder opening exercises.  Pertinent Vitals/Pain Pain Assessment: Faces Faces Pain Scale: Hurts a little bit Pain Location: L hip and knee Pain Descriptors / Indicators: Grimacing;Guarding Pain Intervention(s): Limited activity within patient's tolerance;Monitored during session;Repositioned           PT Goals (current goals can now be found in the care plan section) Acute Rehab PT Goals Patient Stated Goal: go home  PT  Goal Formulation: With patient Time For Goal Achievement: 03/24/18 Potential to Achieve Goals: Fair Progress towards PT goals: Progressing toward goals    Frequency    Min 3X/week      PT Plan Current plan remains appropriate       AM-PAC PT "6 Clicks" Mobility   Outcome Measure  Help needed turning from your back to your side while in a flat bed without using bedrails?: A Little Help needed moving from lying on your back to sitting on the side of a flat bed without using bedrails?: A Little Help needed moving to and from a bed to a chair (including a wheelchair)?: A Little Help needed standing up from a chair using your arms (e.g., wheelchair or bedside chair)?: A Little Help needed to walk in hospital room?: A Little Help needed climbing 3-5 steps with a railing? : Total 6 Click Score: 16    End of Session Equipment Utilized During Treatment: Gait belt Activity Tolerance: Patient limited by pain Patient left: in chair;with call bell/phone within reach;with chair alarm set Nurse Communication: Mobility status;Weight bearing status PT Visit Diagnosis: Other abnormalities of gait and mobility (R26.89);Unsteadiness on feet (R26.81);Muscle weakness (generalized) (M62.81);Difficulty in walking, not elsewhere classified (R26.2);Pain Pain - Right/Left: Left Pain - part of body: Hip     Time: 0738-0800 PT Time Calculation (min) (ACUTE ONLY): 22 min  Charges:  $Gait Training: 8-22 mins                     Rutledge Selsor B. Migdalia Dk PT, DPT Acute Rehabilitation Services Pager [phone] Office [phone]    Julian 03/11/2018, 9:17 AM

## 2018-03-11 NOTE — Consult Note (Signed)
[facility] CM Primary Care Navigator  03/11/2018  Jeremy Sawyer [facility] 04-Aug-1934 [id number]   Met withpatient, wife Jeremy Sawyer) and daughter Jeremy Sawyer) at the bedside to identify possible discharge needs.  Patient states having "severe left hip pain", diagnosed with a stress fracture that had led to thisadmission/ surgery. (stress fracture progressed to displaced femoral neck fracture status post total hip arthroplasty of left hip)  Patient reports that he and his wife have been residents of [facility] facility for about 3 years.  PatientendorsesDr.John Virgina Jock with [facility] as the primary careprovider.   [facility] [address] and [facility] Order service (prescriptions) to obtain medications without difficulty.  He reports managing his own medications at home straight out of the containers.  Patient mentioned that he has been driving prior to admission/ surgery but his daughter or son Jeremy Sawyer) will be able to provide transportation to hisdoctors'appointments, otherwise, can use facility transport if needed.  Patient has been living independently with wife at [facility] facility but his daughter and son will be providing assistance when needed after discharge.   Anticipated discharge planisSNF- skilled nursing facilityper therapy recommendation.He states thatplan is forhim to return back to [place] facility at skilled level of care before returning back to independent living after rehab completion.   Patient expressedunderstanding to call primary care provider's officewhen he returns homefor a post discharge follow-up visitwithin1- 2 weeksor sooner if needs arise.Patient letter (with PCP's contact number) was provided asareminder.  Explained to patient and familyabout [facility] CM services available for health managementand resourcesat homebut indicated having no current needsor  issuesat thispoint. Daughter states that patient has a good family support to help him manage his health needs. Patient voicedunderstanding to seekreferral to [facility] care management from primary care provider if deemed necessary and appropriate for any servicesin thenear future.   [facility] care management information provided for future needs that he may have.  Primary care provider's office is listed as providing transition (TOC) follow-up.    For additional questions please contact:  Jeremy Sawyer, BSN, RN-BC [facility] PRIMARY CARE Navigator Cell: [phone]

## 2018-03-11 NOTE — Progress Notes (Signed)
PROGRESS NOTE  Jeremy Sawyer:[id number] DOB: [birth year] DOA: 03/07/2018 PCP: Shon Baton, MD  HPI/Brief Narrative  Jeremy Sawyer is a 83 y.o. year old male with medical history significant for CAD, HTN who presented on 03/07/2018 with left hip pain for the past 2 weeks and was diagnosed with a stress fracture as an outpatient and was found to have left femoral neck fracture.  Subjective Reports hip pain is adequately controlled  Assessment/Plan:  #Left femoral neck fracture status post total hip arthroplasty (left 03/09/2018), weightbearing as tolerated on left hip per Ortho recommendations, aspirin 325 twice daily x1 month postop DVT prophylaxis.  Status post left knee aspiration.  Effusion of left knee improved.  Left knee cultures negative.  #BPH  Takes Flomax and finasteride at home, and followed by urology outpatient.  Currently with Foley given multiple use of in and out cath.  Will have voiding trial on 12/12 unclear if true bladder outlet obstruction or more a result of recent surgery (given immobilization).  #Hypertension, at goal.  Continue home blood pressure medications  Code Status: Full code  Family Communication: No family at bedside  Disposition Plan: Voiding trial, surgically stable, PT recommends SNF, will make social worker aware   Consultants:  orthopedics     Procedures:  12/9 left total hip arthroplasty  Antimicrobials: Anti-infectives (From admission, onward)   Start     Dose/Rate Route Frequency Ordered Stop   03/09/18 2200  ceFAZolin (ANCEF) IVPB 2g/100 mL premix     2 g 200 mL/hr over 30 Minutes Intravenous Every 8 hours 03/09/18 2131 03/11/18 1344   03/09/18 0600  ceFAZolin (ANCEF) IVPB 2g/100 mL premix     2 g 200 mL/hr over 30 Minutes Intravenous On call to O.R. 03/08/18 1022 03/09/18 1739   03/08/18 1000  ceFAZolin (ANCEF) IVPB 2g/100 mL premix     2 g 200 mL/hr over 30 Minutes Intravenous On call to O.R. 03/08/18 0959  03/09/18 0559          DVT prophylaxis:  aspirin     Objective: Vitals:   03/10/18 1727 03/11/18 0355 03/11/18 1018 03/11/18 1333  BP: (!) 111/57 (!) 108/59 112/60 (!) 109/50  Pulse: 84 80 76 81  Resp: 17 16  16   Temp: 98.2 F (36.8 C) 97.7 F (36.5 C)  98 F (36.7 C)  TempSrc: Oral Oral  Oral  SpO2: 93% 92%  96%  Weight:      Height:        Intake/Output Summary (Last 24 hours) at 03/11/2018 1919 Last data filed at 03/11/2018 1700 Gross per 24 hour  Intake 840 ml  Output 125 ml  Net 715 ml   Filed Weights   03/07/18 2145 03/08/18 0038 03/09/18 0303  Weight: 83.9 kg 84 kg 85.6 kg    Exam:  Constitutional:normal appearing male Eyes: EOMI, anicteric, normal conjunctivae ENMT: Oropharynx with moist mucous membranes, normal dentition Cardiovascular: RRR no MRGs, with no peripheral edema Respiratory: Normal respiratory effort, clear breath sounds  Abdomen: Soft,non-tender, with no HSM MSK: Dressing in place and lateral left hip, clean, dry, intact. Skin: No rash ulcers, or lesions. Without skin tenting GU: Foley catheter in place draining yellow urine Neurologic: Grossly no focal neuro deficit. Psychiatric:Appropriate affect, and mood. Mental status AAOx3  Data Reviewed: CBC: Recent Labs  Lab 03/07/18 2154 03/10/18 0234 03/11/18 0230  WBC 14.1* 11.8* 13.0*  NEUTROABS 11.8*  --   --   HGB 13.4 11.1* 9.8*  HCT 41.6 33.3*  29.3*  MCV 95.6 92.5 93.0  PLT 189 189 409   Basic Metabolic Panel: Recent Labs  Lab 03/07/18 2154 03/10/18 0234  NA 133* 137  K 3.9 3.6  CL 98 101  CO2 20* 26  GLUCOSE 124* 169*  BUN 18 16  CREATININE 0.99 0.75  CALCIUM 8.7* 8.1*   GFR: Estimated Creatinine Clearance: 74.8 mL/min (by C-G formula based on SCr of 0.75 mg/dL). Liver Function Tests: No results for input(s): AST, ALT, ALKPHOS, BILITOT, PROT, ALBUMIN in the last 168 hours. No results for input(s): LIPASE, AMYLASE in the last 168 hours. No results for  input(s): AMMONIA in the last 168 hours. Coagulation Profile: Recent Labs  Lab 03/07/18 2257  INR 1.11   Cardiac Enzymes: No results for input(s): CKTOTAL, CKMB, CKMBINDEX, TROPONINI in the last 168 hours. BNP (last 3 results) No results for input(s): PROBNP in the last 8760 hours. HbA1C: No results for input(s): HGBA1C in the last 72 hours. CBG: No results for input(s): GLUCAP in the last 168 hours. Lipid Profile: No results for input(s): CHOL, HDL, LDLCALC, TRIG, CHOLHDL, LDLDIRECT in the last 72 hours. Thyroid Function Tests: No results for input(s): TSH, T4TOTAL, FREET4, T3FREE, THYROIDAB in the last 72 hours. Anemia Panel: No results for input(s): VITAMINB12, FOLATE, FERRITIN, TIBC, IRON, RETICCTPCT in the last 72 hours. Urine analysis:    Component Value Date/Time   COLORURINE YELLOW 03/10/2018 [place] 03/10/2018 1253   LABSPEC 1.028 03/10/2018 1253   PHURINE 5.0 03/10/2018 1253   GLUCOSEU NEGATIVE 03/10/2018 1253   HGBUR NEGATIVE 03/10/2018 1253   [place] 03/10/2018 1253   KETONESUR NEGATIVE 03/10/2018 1253   PROTEINUR NEGATIVE 03/10/2018 1253   UROBILINOGEN 0.2 05/09/2011 1345   NITRITE NEGATIVE 03/10/2018 1253   LEUKOCYTESUR NEGATIVE 03/10/2018 1253   Sepsis Labs: @LABRCNTIP (procalcitonin:4,lacticidven:4)  ) Recent Results (from the past 240 hour(s))  Surgical PCR screen     Status: None   Collection Time: 03/08/18  8:20 AM  Result Value Ref Range Status   MRSA, PCR NEGATIVE NEGATIVE Final   Staphylococcus aureus NEGATIVE NEGATIVE Final    Comment: (NOTE) The Xpert SA Assay (FDA approved for NASAL specimens in patients 53 years of age and older), is one component of a comprehensive surveillance program. It is not intended to diagnose infection nor to guide or monitor treatment. Performed at [facility], [place] [address] [place], [place] [address]   Aerobic/Anaerobic Culture (surgical/deep wound)     Status: None  (Preliminary result)   Collection Time: 03/09/18  6:01 PM  Result Value Ref Range Status   Specimen Description SYNOVIAL KNEE  Final   Special Requests NONE  Final   Gram Stain   Final    ABUNDANT WBC PRESENT,BOTH PMN AND MONONUCLEAR NO ORGANISMS SEEN    Culture   Final    NO GROWTH 2 DAYS NO ANAEROBES ISOLATED; CULTURE IN PROGRESS FOR 5 DAYS Performed at [facility], [place] [address], [place], [place] [address]    Report Status PENDING  Incomplete      Studies: No results found.  Scheduled Meds: . amLODipine  10 mg Oral Daily  . aspirin EC  325 mg Oral BID PC  . atorvastatin  40 mg Oral Daily  . docusate sodium  100 mg Oral BID  . finasteride  5 mg Oral Daily  . metoprolol succinate  100 mg Oral Daily  . tamsulosin  0.4 mg Oral Daily    Continuous Infusions: .  sodium chloride 10 mL/hr at 03/11/18 0700  . methocarbamol (ROBAXIN) IV       LOS: 4 days     Desiree Hane, MD [facility] Pager [phone]  If 7PM-7AM, please contact night-coverage [email or web address] Password [facility] 03/11/2018, 7:19 PM

## 2018-03-12 DIAGNOSIS — R3914 Feeling of incomplete bladder emptying: Secondary | ICD-10-CM

## 2018-03-12 DIAGNOSIS — N401 Enlarged prostate with lower urinary tract symptoms: Secondary | ICD-10-CM

## 2018-03-12 LAB — CBC
HCT: 27.7 % — ABNORMAL LOW (ref 39.0–52.0)
Hemoglobin: 8.9 g/dL — ABNORMAL LOW (ref 13.0–17.0)
MCH: 30.3 pg (ref 26.0–34.0)
MCHC: 32.1 g/dL (ref 30.0–36.0)
MCV: 94.2 fL (ref 80.0–100.0)
Platelets: 210 10*3/uL (ref 150–400)
RBC: 2.94 MIL/uL — ABNORMAL LOW (ref 4.22–5.81)
RDW: 13.1 % (ref 11.5–15.5)
WBC: 8.4 10*3/uL (ref 4.0–10.5)
nRBC: 0 % (ref 0.0–0.2)

## 2018-03-12 MED ORDER — SULFAMETHOXAZOLE-TRIMETHOPRIM 400-80 MG PO TABS
1.0000 | ORAL_TABLET | Freq: Two times a day (BID) | ORAL | Status: DC
  Administered 2018-03-12 – 2018-03-13 (×3): 1 via ORAL
  Filled 2018-03-12 (×3): qty 1

## 2018-03-12 NOTE — Discharge Instructions (Signed)

## 2018-03-12 NOTE — Progress Notes (Signed)
Subjective: 3 Days Post-Op Procedure(s) (LRB): TOTAL HIP ARTHROPLASTY ANTERIOR APPROACH LEFT HIP Aspiration Fluid LEFT KNEE (Left) Patient reports pain as mild.    Objective: Vital signs in last 24 hours: Temp:  [98 F (36.7 C)-98.2 F (36.8 C)] 98.2 F (36.8 C) (12/12 0523) Pulse Rate:  [76-81] 79 (12/12 0523) Resp:  [16] 16 (12/12 0523) BP: (109-120)/(50-60) 120/57 (12/12 0523) SpO2:  [91 %-96 %] 91 % (12/12 0523)  Intake/Output from previous day: 12/11 0701 - 12/12 0700 In: 1080 [P.O.:1080] Out: -  Intake/Output this shift: No intake/output data recorded.  Recent Labs    03/10/18 0234 03/11/18 0230 03/12/18 0228  HGB 11.1* 9.8* 8.9*   Recent Labs    03/11/18 0230 03/12/18 0228  WBC 13.0* 8.4  RBC 3.15* 2.94*  HCT 29.3* 27.7*  PLT 198 210   Recent Labs    03/10/18 0234  NA 137  K 3.6  CL 101  CO2 26  BUN 16  CREATININE 0.75  GLUCOSE 169*  CALCIUM 8.1*   No results for input(s): LABPT, INR in the last 72 hours.  Neurologically intact ABD soft Neurovascular intact No cellulitis present Compartment soft    Assessment/Plan: 3 Days Post-Op Procedure(s) (LRB): TOTAL HIP ARTHROPLASTY ANTERIOR APPROACH LEFT HIP Aspiration Fluid LEFT KNEE (Left) Advance diet Up with therapy Discharge to SNF  Patient also having urinary retention//I spoke with his urologist Dr. Louis Meckel and he would like to discharge the patient on Bactrim 1 by mouth twice a day and have him follow-up in the office in one week.  From our standpoint the patient can be discharged and I will discuss this plan with the hospitalist.    Alta Corning 03/12/2018, 8:24 AM

## 2018-03-12 NOTE — Progress Notes (Signed)
PROGRESS NOTE  Jeremy Sawyer YIF:[id number] DOB: 06/23/1934 DOA: 03/07/2018 PCP: Shon Baton, MD  HPI/Brief Narrative  Jeremy Sawyer is a 83 y.o. year old male with medical history significant for CAD, HTN who presented on 03/07/2018 with left hip pain for the past 2 weeks and was diagnosed with a stress fracture as an outpatient and was found to have left femoral neck fracture.  Subjective Continues to report well-controlled pain in hip. No cough, no chest pain, no shortness of breath.  Assessment/Plan:  #Left femoral neck fracture status post total hip arthroplasty (left 03/09/2018), weightbearing as tolerated on left hip per Ortho recommendations, aspirin 325 twice daily x1 month postop DVT prophylaxis.  PT recommends SNF, patient came from facility, awaiting social worker to assist with transition back  #Status post left knee aspiration.  Effusion of left knee improved.  Left knee cultures negative.  #BPH  Takes Flomax and finasteride at home, and followed by urology outpatient.  Currently with Foley given multiple use of in and out cath.  His outpatient urologist spoken to by orthopedic physician recommends continuing Foley on discharge and will follow-up, also recommended to continue Bactrim until then.   #Hypertension, at goal.  Continue home blood pressure medications  Code Status: Full code  Family Communication: No family at bedside  Disposition Plan: [facility] and orthopedically ready for discharge, awaiting social worker assistance with SNF disposition  Consultants:  orthopedics , S/w    Procedures:  12/9 left total hip arthroplasty  Antimicrobials: Anti-infectives (From admission, onward)   Start     Dose/Rate Route Frequency Ordered Stop   03/12/18 1000  sulfamethoxazole-trimethoprim (BACTRIM,SEPTRA) 400-80 MG per tablet 1 tablet     1 tablet Oral Every 12 hours 03/12/18 0902     03/09/18 2200  ceFAZolin (ANCEF) IVPB 2g/100 mL premix     2 g 200  mL/hr over 30 Minutes Intravenous Every 8 hours 03/09/18 2131 03/11/18 1344   03/09/18 0600  ceFAZolin (ANCEF) IVPB 2g/100 mL premix     2 g 200 mL/hr over 30 Minutes Intravenous On call to O.R. 03/08/18 1022 03/09/18 1739   03/08/18 1000  ceFAZolin (ANCEF) IVPB 2g/100 mL premix     2 g 200 mL/hr over 30 Minutes Intravenous On call to O.R. 03/08/18 0959 03/09/18 0559         DVT prophylaxis:  aspirin     Objective: Vitals:   03/11/18 1333 03/12/18 0523 03/12/18 0908 03/12/18 1356  BP: (!) 109/50 (!) 120/57 (!) 120/57 (!) 107/55  Pulse: 81 79 79 89  Resp: 16 16  18   Temp: 98 F (36.7 C) 98.2 F (36.8 C)  98.4 F (36.9 C)  TempSrc: Oral Oral  Oral  SpO2: 96% 91%  96%  Weight:      Height:        Intake/Output Summary (Last 24 hours) at 03/12/2018 1627 Last data filed at 03/11/2018 2000 Gross per 24 hour  Intake 480 ml  Output -  Net 480 ml   Filed Weights   03/07/18 2145 03/08/18 0038 03/09/18 0303  Weight: 83.9 kg 84 kg 85.6 kg    Exam:  Constitutional:normal appearing male Eyes: EOMI, anicteric, normal conjunctivae ENMT: Oropharynx with moist mucous membranes, normal dentition Cardiovascular: RRR no MRGs, with no peripheral edema Respiratory: Normal respiratory effort, clear breath sounds  Abdomen: Soft,non-tender, MSK: Dressing in place and lateral left hip, clean, dry, intact. Skin: No rash ulcers, or lesions. Without skin tenting GU: Foley catheter in place  draining yellow urine Neurologic: Grossly no focal neuro deficit. Psychiatric:Appropriate affect, and mood. Mental status AAOx3  Data Reviewed: CBC: Recent Labs  Lab 03/07/18 2154 03/10/18 0234 03/11/18 0230 03/12/18 0228  WBC 14.1* 11.8* 13.0* 8.4  NEUTROABS 11.8*  --   --   --   HGB 13.4 11.1* 9.8* 8.9*  HCT 41.6 33.3* 29.3* 27.7*  MCV 95.6 92.5 93.0 94.2  PLT 189 189 198 017   Basic Metabolic Panel: Recent Labs  Lab 03/07/18 2154 03/10/18 0234  NA 133* 137  K 3.9 3.6  CL 98  101  CO2 20* 26  GLUCOSE 124* 169*  BUN 18 16  CREATININE 0.99 0.75  CALCIUM 8.7* 8.1*   GFR: Estimated Creatinine Clearance: 74.8 mL/min (by C-G formula based on SCr of 0.75 mg/dL). Liver Function Tests: No results for input(s): AST, ALT, ALKPHOS, BILITOT, PROT, ALBUMIN in the last 168 hours. No results for input(s): LIPASE, AMYLASE in the last 168 hours. No results for input(s): AMMONIA in the last 168 hours. Coagulation Profile: Recent Labs  Lab 03/07/18 2257  INR 1.11   Cardiac Enzymes: No results for input(s): CKTOTAL, CKMB, CKMBINDEX, TROPONINI in the last 168 hours. BNP (last 3 results) No results for input(s): PROBNP in the last 8760 hours. HbA1C: No results for input(s): HGBA1C in the last 72 hours. CBG: No results for input(s): GLUCAP in the last 168 hours. Lipid Profile: No results for input(s): CHOL, HDL, LDLCALC, TRIG, CHOLHDL, LDLDIRECT in the last 72 hours. Thyroid Function Tests: No results for input(s): TSH, T4TOTAL, FREET4, T3FREE, THYROIDAB in the last 72 hours. Anemia Panel: No results for input(s): VITAMINB12, FOLATE, FERRITIN, TIBC, IRON, RETICCTPCT in the last 72 hours. Urine analysis:    Component Value Date/Time   COLORURINE YELLOW 03/10/2018 [place] 03/10/2018 1253   LABSPEC 1.028 03/10/2018 1253   PHURINE 5.0 03/10/2018 1253   GLUCOSEU NEGATIVE 03/10/2018 1253   HGBUR NEGATIVE 03/10/2018 1253   [place] 03/10/2018 1253   KETONESUR NEGATIVE 03/10/2018 1253   PROTEINUR NEGATIVE 03/10/2018 1253   UROBILINOGEN 0.2 05/09/2011 1345   NITRITE NEGATIVE 03/10/2018 1253   LEUKOCYTESUR NEGATIVE 03/10/2018 1253   Sepsis Labs: @LABRCNTIP (procalcitonin:4,lacticidven:4)  ) Recent Results (from the past 240 hour(s))  Surgical PCR screen     Status: None   Collection Time: 03/08/18  8:20 AM  Result Value Ref Range Status   MRSA, PCR NEGATIVE NEGATIVE Final   Staphylococcus aureus NEGATIVE NEGATIVE Final    Comment:  (NOTE) The Xpert SA Assay (FDA approved for NASAL specimens in patients 7 years of age and older), is one component of a comprehensive surveillance program. It is not intended to diagnose infection nor to guide or monitor treatment. Performed at [facility], [place] [address], [place], [place] [address]   Aerobic/Anaerobic Culture (surgical/deep wound)     Status: None (Preliminary result)   Collection Time: 03/09/18  6:01 PM  Result Value Ref Range Status   Specimen Description SYNOVIAL KNEE  Final   Special Requests NONE  Final   Gram Stain   Final    ABUNDANT WBC PRESENT,BOTH PMN AND MONONUCLEAR NO ORGANISMS SEEN    Culture   Final    NO GROWTH 3 DAYS NO ANAEROBES ISOLATED; CULTURE IN PROGRESS FOR 5 DAYS Performed at [facility], [place] [address], [place], [place] [address]    Report Status PENDING  Incomplete      Studies: No results found.  Scheduled Meds: . amLODipine  10 mg Oral Daily  . aspirin EC  325 mg Oral BID PC  . atorvastatin  40 mg Oral Daily  . docusate sodium  100 mg Oral BID  . finasteride  5 mg Oral Daily  . metoprolol succinate  100 mg Oral Daily  . sulfamethoxazole-trimethoprim  1 tablet Oral Q12H  . tamsulosin  0.4 mg Oral Daily    Continuous Infusions: . methocarbamol (ROBAXIN) IV       LOS: 5 days     Desiree Hane, MD [facility] Pager [phone]  If 7PM-7AM, please contact night-coverage [email or web address] Password [facility] 03/12/2018, 4:27 PM

## 2018-03-13 DIAGNOSIS — Z419 Encounter for procedure for purposes other than remedying health state, unspecified: Secondary | ICD-10-CM

## 2018-03-13 DIAGNOSIS — I2510 Atherosclerotic heart disease of native coronary artery without angina pectoris: Secondary | ICD-10-CM

## 2018-03-13 MED ORDER — HYDROCODONE-ACETAMINOPHEN 5-325 MG PO TABS: 1.0000 | ORAL_TABLET | ORAL | 0 refills | Status: AC | PRN

## 2018-03-13 MED ORDER — FINASTERIDE 5 MG PO TABS: 5.0000 mg | ORAL_TABLET | Freq: Every day | ORAL | Status: AC

## 2018-03-13 MED ORDER — METOPROLOL SUCCINATE ER 100 MG PO TB24: 100.0000 mg | ORAL_TABLET | Freq: Every day | ORAL | Status: AC

## 2018-03-13 MED ORDER — ATORVASTATIN CALCIUM 40 MG PO TABS: 40.0000 mg | ORAL_TABLET | Freq: Every day | ORAL | Status: AC

## 2018-03-13 MED ORDER — SENNOSIDES-DOCUSATE SODIUM 8.6-50 MG PO TABS: 1.0000 | ORAL_TABLET | Freq: Every evening | ORAL | Status: AC | PRN

## 2018-03-13 MED ORDER — METHOCARBAMOL 500 MG PO TABS: 500.0000 mg | ORAL_TABLET | Freq: Four times a day (QID) | ORAL | Status: DC | PRN

## 2018-03-13 MED ORDER — AMLODIPINE BESYLATE 10 MG PO TABS: 10.0000 mg | ORAL_TABLET | Freq: Every day | ORAL | Status: AC

## 2018-03-13 MED ORDER — DOCUSATE SODIUM 100 MG PO CAPS: 100.0000 mg | ORAL_CAPSULE | Freq: Two times a day (BID) | ORAL | 0 refills | Status: AC

## 2018-03-13 MED ORDER — POLYETHYLENE GLYCOL 3350 17 G PO PACK: 17.0000 g | PACK | Freq: Every day | ORAL | 0 refills | Status: AC | PRN

## 2018-03-13 MED ORDER — SULFAMETHOXAZOLE-TRIMETHOPRIM 400-80 MG PO TABS: 1.0000 | ORAL_TABLET | Freq: Two times a day (BID) | ORAL | 0 refills | Status: DC

## 2018-03-13 MED ORDER — TAMSULOSIN HCL 0.4 MG PO CAPS: 0.4000 mg | ORAL_CAPSULE | Freq: Every day | ORAL | Status: AC

## 2018-03-13 NOTE — Discharge Summary (Signed)
Discharge Summary  Jeremy Sawyer PNT:[id number] DOB: 03-Mar-1935  PCP: Shon Baton, MD  Admit date: 03/07/2018 Discharge date: 03/13/2018   Time spent: < 25 minutes  Admitted From: [facility] Disposition:  SNF  Recommendations for Outpatient Follow-up:  1. Follow up with PCP in 1-2 weeks 2. Ortho recommendations :Weight-bear as tolerated on left without hip precautions. Aspirin 325 mg twice daily for DVT prophylaxis enteric-coated x1 month postop. 3. Follow-up with Dr. Berenice Primas (orthopedics) in 2 weeks..  4. Follow up with Louis Meckel (Urology) in 1 week. Continue bactrim BID until then and continue foley until seen by him     Discharge Diagnoses:  Active Hospital Problems   Diagnosis Date Noted  . Fracture of unspecified part of neck of left femur, initial encounter for closed fracture ([place]) 03/07/2018  . BPH (benign prostatic hyperplasia) 03/11/2018  . CAD (coronary artery disease) 05/09/2011  . HTN (hypertension) 05/09/2011  . Hypercholesterolemia 05/09/2011    Resolved Hospital Problems  No resolved problems to display.    Discharge Condition: Stable   CODE STATUS:FULL   History of present illness:  Jeremy Sawyer is a 83 y.o. year old male with medical history significant for CAD, HTN who presented on 03/07/2018 with left hip pain for the past 2 weeks and was diagnosed with a stress fracture as an outpatient and was found to have progression to left femoral neck fracture.Remaining hospital course addressed in problem based format below:   Hospital Course:  #Left femoral neck fracture status post total hip arthroplasty (03/09/2018). Presented with progressive left hip pain initially diagnosed as stress fracture as outpatient but found to have femoral neck fracture no XR upon presentation to EDNo complications from procedure. Orthopedics recommends weight bearing as tolerated on left hip. Continue therapy at SNF. Aspirin 325 mg BID as recommended by ortho for  prophylaxis x  78month post op. Follow up with Dr. Berenice Primas in 1 week.   #Swollen left knee. Underwent aspiration of knee intraoperatively on same day as procedure above. Effusion improved after aspiration and cultures to date remained negative.   #CAD history. Stents placed 20 years ago. Remained asymptomatic here aspirin changed to 325 mg BID for postop prophylaxis for 1 month  #HTN. Remained stable. Continue home metoprolol, norvasc  BPH. Longstanding history of flomax and finasteride and followed by urologist. Required multiple in and out catheteriazations because of incomplete bladder emptying. Ultimately foley was placed and per directions from his urologist he should continue this on discharge until he is seen in follow up in 1 week. Also will be on bactrim 1 tab BID as directed by his urologist until that outpatient appointment    Consultations:  Ortho  Procedures/Studies:  12/9 left total hip arthroplasty  Discharge Exam: BP 121/62   Pulse 80   Temp 98 F (36.7 C) (Oral)   Resp 20   Ht 5\' 10"  (1.778 m)   Wt 85.6 kg   SpO2 94%   BMI 27.08 kg/m   General: Lying in bed, no apparent distress Eyes: EOMI, anicteric ENT: Oral Mucosa clear and moist Cardiovascular: regular rate and rhythm, no murmurs, rubs or gallops, no edema, Respiratory: Normal respiratory effort, lungs clear to auscultation bilaterally  MSK: post surgical dressing in place on lateral left hip dry blood present but no erythema or drainage GU: foley in place draining clear urine Abdomen: soft, non-distended, non-tender, normal bowel sounds Skin: No Rash Neurologic: Grossly no focal neuro deficit.Mental status AAOx3, speech normal, Psychiatric:Appropriate affect, and mood  Discharge Instructions You were cared for by a hospitalist during your hospital stay. If you have any questions about your discharge medications or the care you received while you were in the hospital after you are discharged, you can  call the unit and asked to speak with the hospitalist on call if the hospitalist that took care of you is not available. Once you are discharged, your primary care physician will handle any further medical issues. Please note that NO REFILLS for any discharge medications will be authorized once you are discharged, as it is imperative that you return to your primary care physician (or establish a relationship with a primary care physician if you do not have one) for your aftercare needs so that they can reassess your need for medications and monitor your lab values.  Discharge Instructions    Diet - low sodium heart healthy   Complete by:  As directed    Increase activity slowly   Complete by:  As directed    Weight bearing as tolerated   Complete by:  As directed    Laterality:  left   Extremity:  Lower     Allergies as of 03/13/2018   No Known Allergies     Medication List    STOP taking these medications   aspirin 81 MG tablet Replaced by:  aspirin EC 325 MG tablet   diclofenac 75 MG EC tablet Commonly known as:  VOLTAREN   oxyCODONE-acetaminophen 5-325 MG tablet Commonly known as:  PERCOCET/ROXICET     TAKE these medications   amLODipine 10 MG tablet Commonly known as:  NORVASC Take 1 tablet (10 mg total) by mouth daily.   aspirin EC 325 MG tablet Take 1 tablet (325 mg total) by mouth 2 (two) times daily after a meal. Take x 1 month post op to decrease risk of blood clots. Replaces:  aspirin 81 MG tablet   atorvastatin 40 MG tablet Commonly known as:  LIPITOR Take 1 tablet (40 mg total) by mouth daily.   docusate sodium 100 MG capsule Commonly known as:  COLACE Take 1 capsule (100 mg total) by mouth 2 (two) times daily.   finasteride 5 MG tablet Commonly known as:  PROSCAR Take 1 tablet (5 mg total) by mouth daily.   HYDROcodone-acetaminophen 5-325 MG tablet Commonly known as:  NORCO/VICODIN Take 1-2 tablets by mouth every 4 (four) hours as needed for up to 5  days for moderate pain (pain score 4-6). What changed:    when to take this  reasons to take this   methocarbamol 500 MG tablet Commonly known as:  ROBAXIN Take 1 tablet (500 mg total) by mouth every 6 (six) hours as needed for muscle spasms.   metoprolol succinate 100 MG 24 hr tablet Commonly known as:  TOPROL-XL Take 1 tablet (100 mg total) by mouth daily. Take with or immediately following a meal.   polyethylene glycol packet Commonly known as:  MIRALAX / GLYCOLAX Take 17 g by mouth daily as needed for mild constipation.   senna-docusate 8.6-50 MG tablet Commonly known as:  Senokot-S Take 1 tablet by mouth at bedtime as needed for mild constipation.   sulfamethoxazole-trimethoprim 400-80 MG tablet Commonly known as:  BACTRIM,SEPTRA Take 1 tablet by mouth 2 (two) times daily.   tamsulosin 0.4 MG Caps capsule Commonly known as:  FLOMAX Take 1 capsule (0.4 mg total) by mouth daily.            Discharge Care Instructions  (From admission, onward)  Start     Ordered   03/09/18 0000  Weight bearing as tolerated    Question Answer Comment  Laterality left   Extremity Lower      03/09/18 1948         No Known Allergies Follow-up Information    Dorna Leitz, MD. Schedule an appointment as soon as possible for a visit in 2 weeks.   Specialty:  Orthopedic Surgery Contact information: [place] [address] [phone]        Ardis Hughs, MD In 1 week.   Specialty:  Urology Why:  Catheter removal - Nurse Visit Contact information: [place] [address] [phone]            The results of significant diagnostics from this hospitalization (including imaging, microbiology, ancillary and laboratory) are listed below for reference.    Significant Diagnostic Studies: Dg Chest 1 View  Result Date: 03/07/2018 CLINICAL DATA:  Hip fxhip fracture EXAM: CHEST  1 VIEW COMPARISON:  None. FINDINGS: Normal  mediastinum and cardiac silhouette. Normal pulmonary vasculature. Prominent vascular shadow along the superior RIGHT mediastinum unchanged. No evidence of effusion, infiltrate, or pneumothorax. No acute bony abnormality. IMPRESSION: No acute cardiopulmonary process. Electronically Signed   By: Suzy Bouchard M.D.   On: 03/07/2018 22:58   Dg C-arm 1-60 Min  Result Date: 03/09/2018 CLINICAL DATA:  Hip fracture EXAM: DG C-ARM 61-120 MIN; OPERATIVE LEFT HIP WITH PELVIS COMPARISON:  03/07/2018 FINDINGS: Four low resolution intraoperative spot views of the left hip. Total fluoroscopy time was 18 seconds. Prior right hip replacement. Interval left hip replacement with normal alignment of the hardware. Pubic symphysis and rami are intact. IMPRESSION: Intraoperative fluoroscopic assistance provided during left hip replacement Electronically Signed   By: Donavan Foil M.D.   On: 03/09/2018 20:25   Dg Hip Operative Unilat W Or W/o Pelvis Left  Result Date: 03/09/2018 CLINICAL DATA:  Hip fracture EXAM: DG C-ARM 61-120 MIN; OPERATIVE LEFT HIP WITH PELVIS COMPARISON:  03/07/2018 FINDINGS: Four low resolution intraoperative spot views of the left hip. Total fluoroscopy time was 18 seconds. Prior right hip replacement. Interval left hip replacement with normal alignment of the hardware. Pubic symphysis and rami are intact. IMPRESSION: Intraoperative fluoroscopic assistance provided during left hip replacement Electronically Signed   By: Donavan Foil M.D.   On: 03/09/2018 20:25   Dg Hip Unilat W Or Wo Pelvis 2-3 Views Left  Result Date: 03/07/2018 CLINICAL DATA:  83 year old male with left hip pain. EXAM: DG HIP (WITH OR WITHOUT PELVIS) 2-3V LEFT COMPARISON:  Pelvic radiograph dated 05/15/2011 FINDINGS: There is a displaced fracture of the left femoral neck with superior migration of the femur and impaction on the lateral acetabular roof. The left femoral head is poorly visualized due to advanced osteopenia but  appears to be within the acetabulum. There is a total right hip arthroplasty which appears intact. IMPRESSION: 1. Displaced fracture of the left femoral neck. 2. Total right hip arthroplasty appears intact. Electronically Signed   By: Anner Crete M.D.   On: 03/07/2018 22:58    Microbiology: Recent Results (from the past 240 hour(s))  Surgical PCR screen     Status: None   Collection Time: 03/08/18  8:20 AM  Result Value Ref Range Status   MRSA, PCR NEGATIVE NEGATIVE Final   Staphylococcus aureus NEGATIVE NEGATIVE Final    Comment: (NOTE) The Xpert SA Assay (FDA approved for NASAL specimens in patients 38 years of age and older),  is one component of a comprehensive surveillance program. It is not intended to diagnose infection nor to guide or monitor treatment. Performed at [facility], [place] [address], [place], [place] [address]   Aerobic/Anaerobic Culture (surgical/deep wound)     Status: None (Preliminary result)   Collection Time: 03/09/18  6:01 PM  Result Value Ref Range Status   Specimen Description SYNOVIAL KNEE  Final   Special Requests NONE  Final   Gram Stain   Final    ABUNDANT WBC PRESENT,BOTH PMN AND MONONUCLEAR NO ORGANISMS SEEN    Culture   Final    NO GROWTH 3 DAYS NO ANAEROBES ISOLATED; CULTURE IN PROGRESS FOR 5 DAYS Performed at [facility], [place] [address] [place], [place] [address]    Report Status PENDING  Incomplete     Labs: Basic Metabolic Panel: Recent Labs  Lab 03/07/18 2154 03/10/18 0234  NA 133* 137  K 3.9 3.6  CL 98 101  CO2 20* 26  GLUCOSE 124* 169*  BUN 18 16  CREATININE 0.99 0.75  CALCIUM 8.7* 8.1*   Liver Function Tests: No results for input(s): AST, ALT, ALKPHOS, BILITOT, PROT, ALBUMIN in the last 168 hours. No results for input(s): LIPASE, AMYLASE in the last 168 hours. No results for input(s): AMMONIA in the last 168 hours. CBC: Recent Labs  Lab 03/07/18 2154 03/10/18 0234 03/11/18 0230 03/12/18 0228  WBC  14.1* 11.8* 13.0* 8.4  NEUTROABS 11.8*  --   --   --   HGB 13.4 11.1* 9.8* 8.9*  HCT 41.6 33.3* 29.3* 27.7*  MCV 95.6 92.5 93.0 94.2  PLT 189 189 198 210   Cardiac Enzymes: No results for input(s): CKTOTAL, CKMB, CKMBINDEX, TROPONINI in the last 168 hours. BNP: BNP (last 3 results) No results for input(s): BNP in the last 8760 hours.  ProBNP (last 3 results) No results for input(s): PROBNP in the last 8760 hours.  CBG: No results for input(s): GLUCAP in the last 168 hours.     Signed:  Desiree Hane, MD [facility] 03/13/2018, 10:16 AM

## 2018-03-13 NOTE — Clinical Social Work Placement (Signed)
   CLINICAL SOCIAL WORK PLACEMENT  NOTE  Date:  03/13/2018  Patient Details  Name: Jeremy Sawyer MRN: [id number] Date of Birth: 1934/05/20  Clinical Social Work is seeking post-discharge placement for this patient at the [place] level of care (*CSW will initial, date and re-position this form in  chart as items are completed):      Patient/family provided with [place] Work Department's list of facilities offering this level of care within the geographic area requested by the patient (or if unable, by the patient's family).  Yes   Patient/family informed of their freedom to choose among providers that offer the needed level of care, that participate in Medicare, Medicaid or managed care program needed by the patient, have an available bed and are willing to accept the patient.      Patient/family informed of [facility]'s ownership interest in [facility] and [facility], as well as of the fact that they are under no obligation to receive care at these facilities.  PASRR submitted to EDS on       PASRR number received on 03/13/18     Existing PASRR number confirmed on       FL2 transmitted to all facilities in geographic area requested by pt/family on 03/13/18     FL2 transmitted to all facilities within larger geographic area on       Patient informed that his/her managed care company has contracts with or will negotiate with certain facilities, including the following:        Yes   Patient/family informed of bed offers received.  Patient chooses bed at Other - please specify in the comment section below:([facility])     Physician recommends and patient chooses bed at      Patient to be transferred to Other - please specify in the comment section below:([facility]) on 03/13/18.  Patient to be transferred to facility by [facility]     Patient family notified on 03/13/18 of transfer.  Name of family member notified:  Margie      PHYSICIAN       Additional Comment:    _______________________________________________ Alberteen Sam, LCSW 03/13/2018, 10:27 AM

## 2018-03-13 NOTE — Progress Notes (Signed)
Gaspar Skeeters PA was contacted in regards to needing a discharge order.  Nurse was instructed to call [facility].  [facility] has been paged.

## 2018-03-13 NOTE — Clinical Social Work Note (Signed)
Clinical Social Work Assessment  Patient Details  Name: Jeremy Sawyer MRN: [id number] Date of Birth: 15-Mar-1935  Date of referral:  03/13/18               Reason for consult:  Discharge Planning                Permission sought to share information with:  Case Manager, Facility Sport and exercise psychologist, Family Supports Permission granted to share information::  Yes, Verbal Permission Granted  Name::     Engineer, drilling::  SNFs  Relationship::  spouse  Contact Information:  [phone]  Housing/Transportation Living arrangements for the past 2 months:  [place] of Information:  Patient Patient Interpreter Needed:  None Criminal Activity/Legal Involvement Pertinent to Current Situation/Hospitalization:  No - Comment as needed Significant Relationships:  Spouse Lives with:  Spouse Do you feel safe going back to the place where you live?  No Need for family participation in patient care:  Yes (Comment)  Care giving concerns:  CSW received referral for possible SNF placement at time of discharge. Spoke with patient regarding possibility of SNF placement . Patient's family   is currently unable to care for him at their home given patient's current needs and fall risk.  Patient  expressed understanding of PT recommendation and are agreeable to SNF placement at time of discharge. CSW to continue to follow and assist with discharge planning needs.     Social Worker assessment / plan: Spoke with patient   concerning possibility of rehab at SNF before returning home. Patient has been a resident at [facility] and is looking forward to returning to [place] at [facility]. CSW confirmed with [facility] they are able toa ccept patient back.    Employment status:  Retired Forensic scientist:  Medicare PT Recommendations:  [place] / Referral to community resources:  [place]  Patient/Family's Response to care:   Patient recognized need for rehab before returning home and are agreeable to a SNF in [place]. They report preference for returning to [place]. CSW explained insurance authorization process. Patient's family reported that they want patient to get stronger to be able to come back home.    Patient/Family's Understanding of and Emotional Response to Diagnosis, Current Treatment, and Prognosis:  Patient/family is realistic regarding therapy needs and expressed being hopeful for SNF placement. Patient expressed understanding of CSW role and discharge process as well as medical condition. No questions/concerns about plan or treatment.    Emotional Assessment Appearance:  Appears stated age Attitude/Demeanor/Rapport:  Gracious Affect (typically observed):  Accepting Orientation:  Oriented to Self, Oriented to Place, Oriented to  Time, Oriented to Situation Alcohol / Substance use:  Not Applicable Psych involvement (Current and /or in the community):  No (Comment)  Discharge Needs  Concerns to be addressed:  Discharge Planning Concerns Readmission within the last 30 days:  No Current discharge risk:  Dependent with Mobility Barriers to Discharge:  Continued Medical Work up   [employer], LCSW 03/13/2018, 10:18 AM

## 2018-03-13 NOTE — Progress Notes (Addendum)
Patient will DC to: [facility] Anticipated DC date: 03/13/18 Family notified:Margie Transport by: Corey Harold  Per MD patient ready for DC to [facility] . RN, patient, patient's family, and facility notified of DC. Discharge Summary sent to facility. RN given number for report [phone]. DC packet on chart. Ambulance transport requested for patient.  CSW signing off.  Man, [place]

## 2018-03-13 NOTE — Progress Notes (Signed)
Report was given to Autumn, Therapist, sports at [employer].  Written discharge packet will be sent via [facility].  Awaiting for the arrival of [facility] for transportation.

## 2018-03-13 NOTE — Progress Notes (Signed)
Subjective: 4 Days Post-Op Procedure(s) (LRB): TOTAL HIP ARTHROPLASTY ANTERIOR APPROACH LEFT HIP Aspiration Fluid LEFT KNEE (Left) Patient reports pain as mild.  He reports he is ready to go.  Foley catheter is intact.  He plans on seeing urologist next week.  Objective: Vital signs in last 24 hours: Temp:  [98 F (36.7 C)-98.7 F (37.1 C)] 98 F (36.7 C) (12/13 0425) Pulse Rate:  [70-89] 80 (12/13 0842) Resp:  [18-24] 20 (12/13 0425) BP: (107-127)/(55-62) 121/62 (12/13 0842) SpO2:  [94 %-97 %] 94 % (12/13 0425)  Intake/Output from previous day: 12/12 0701 - 12/13 0700 In: 480 [P.O.:480] Out: 1775 [Urine:1775] Intake/Output this shift: No intake/output data recorded.  Recent Labs    03/11/18 0230 03/12/18 0228  HGB 9.8* 8.9*   Recent Labs    03/11/18 0230 03/12/18 0228  WBC 13.0* 8.4  RBC 3.15* 2.94*  HCT 29.3* 27.7*  PLT 198 210   No results for input(s): NA, K, CL, CO2, BUN, CREATININE, GLUCOSE, CALCIUM in the last 72 hours. No results for input(s): LABPT, INR in the last 72 hours. Left hip exam: Neurovascular intact Sensation intact distally Intact pulses distally Dorsiflexion/Plantar flexion intact Incision: dressing C/D/I Compartment soft  Assessment/Plan: 4 Days Post-Op Procedure(s) (LRB): TOTAL HIP ARTHROPLASTY ANTERIOR APPROACH LEFT HIP Aspiration Fluid LEFT KNEE (Left)  Plan: Discharge back to nursing facility. Weight-bear as tolerated on left without hip precautions. Aspirin 325 mg twice daily for DVT prophylaxis enteric-coated x1 month postop. Follow-up with Dr. Berenice Primas in 2 weeks.     Jeremy Sawyer 03/13/2018, 9:26 AM

## 2018-03-13 NOTE — Progress Notes (Signed)
The patient's son was contacted Data processing manager) and made aware that the patient will be discharged today.  The patient will be going to [facility].

## 2018-03-13 NOTE — NC FL2 (Signed)
[place] LEVEL OF CARE SCREENING TOOL     IDENTIFICATION  Patient Name: Jeremy Sawyer Birthdate: [birth year] Sex: male Admission Date (Current Location): 03/07/2018  [facility] and [place] Number:  Herbalist and Address:  [facility] [place] [place]. [facility], [place] [address], [place], [place] [address]      Provider Number: [id number]  Attending Physician Name and Address:  Desiree Hane, MD  Relative Name and Phone Number:  Lesleigh Noe (spouse) [phone]    Current Level of Care: Hospital Recommended Level of Care: [place] Prior Approval Number:    Date Approved/Denied: 03/13/18 PASRR Number: [id number]  Discharge Plan: SNF    Current Diagnoses: Patient Active Problem List   Diagnosis Date Noted  . BPH (benign prostatic hyperplasia) 03/11/2018  . Fracture of unspecified part of neck of left femur, initial encounter for closed fracture ([place]) 03/07/2018  . Osteoarthritis of right hip 05/17/2011  . Anemia due to blood loss 05/17/2011  . CAD (coronary artery disease) 05/09/2011  . HTN (hypertension) 05/09/2011  . Hypercholesterolemia 05/09/2011  . Pre-op evaluation 05/09/2011    Orientation RESPIRATION BLADDER Height & Weight     Self, Time, Situation, Place  Normal Continent, Indwelling catheter Weight: 85.6 kg Height:  5\' 10"  (177.8 cm)  BEHAVIORAL SYMPTOMS/MOOD NEUROLOGICAL BOWEL NUTRITION STATUS      Continent Diet(see discharge summary)  AMBULATORY STATUS COMMUNICATION OF NEEDS Skin   Limited Assist Verbally Skin abrasions, Surgical wounds(ecchymosis arms, abrasion right arm, closed surgical incision right hip)                       Personal Care Assistance Level of Assistance  Bathing, Feeding, Dressing, Total care Bathing Assistance: Limited assistance Feeding assistance: Independent Dressing Assistance: Limited assistance Total Care Assistance: Limited assistance   Functional Limitations Info  Sight,  Hearing, Speech Sight Info: Adequate Hearing Info: Impaired(hearing aid both ears) Speech Info: Adequate    SPECIAL CARE FACTORS FREQUENCY  PT (By licensed PT), OT (By licensed OT)     PT Frequency: min 5x daily OT Frequency: min 5x daily            Contractures Contractures Info: Not present    Additional Factors Info  Code Status, Allergies Code Status Info: full Allergies Info: no known allergies           Current Medications (03/13/2018):  This is the current hospital active medication list Current Facility-Administered Medications  Medication Dose Route Frequency Provider Last Rate Last Dose  . acetaminophen (TYLENOL) tablet 325-650 mg  325-650 mg Oral Q6H PRN Gary Fleet, PA-C      . alum & mag hydroxide-simeth (MAALOX/MYLANTA) 200-200-20 MG/5ML suspension 30 mL  30 mL Oral Q4H PRN Gary Fleet, PA-C   30 mL at 03/11/18 1751  . amLODipine (NORVASC) tablet 10 mg  10 mg Oral Daily Gary Fleet, PA-C   10 mg at 03/13/18 1829  . aspirin EC tablet 325 mg  325 mg Oral BID PC Gary Fleet, PA-C   325 mg at 03/13/18 9371  . atorvastatin (LIPITOR) tablet 40 mg  40 mg Oral Daily Gary Fleet, PA-C   40 mg at 03/12/18 1823  . bisacodyl (DULCOLAX) EC tablet 5 mg  5 mg Oral Daily PRN Gary Fleet, PA-C   5 mg at 03/10/18 0910  . docusate sodium (COLACE) capsule 100 mg  100 mg Oral BID Gary Fleet, PA-C   100 mg at 03/13/18 6967  . finasteride (PROSCAR)  tablet 5 mg  5 mg Oral Daily Gary Fleet, PA-C   5 mg at 03/13/18 8588  . HYDROcodone-acetaminophen (NORCO/VICODIN) 5-325 MG per tablet 1-2 tablet  1-2 tablet Oral Q4H PRN Gary Fleet, PA-C   1 tablet at 03/13/18 5027  . HYDROmorphone (DILAUDID) injection 1 mg  1 mg Intravenous Q4H PRN Gary Fleet, PA-C   1 mg at 03/09/18 1306  . methocarbamol (ROBAXIN) tablet 500 mg  500 mg Oral Q6H PRN Gary Fleet, PA-C   500 mg at 03/13/18 7412   Or  . methocarbamol (ROBAXIN) 500 mg in dextrose 5 % 50 mL IVPB  500 mg  Intravenous Q6H PRN Gary Fleet, PA-C      . metoprolol succinate (TOPROL-XL) 24 hr tablet 100 mg  100 mg Oral Daily Gary Fleet, PA-C   100 mg at 03/13/18 8786  . ondansetron (ZOFRAN) tablet 4 mg  4 mg Oral Q6H PRN Gary Fleet, PA-C       Or  . ondansetron [facility]) injection 4 mg  4 mg Intravenous Q6H PRN Gary Fleet, PA-C      . polyethylene glycol (MIRALAX / GLYCOLAX) packet 17 g  17 g Oral Daily PRN Gary Fleet, PA-C      . senna-docusate (Senokot-S) tablet 1 tablet  1 tablet Oral QHS PRN Gary Fleet, PA-C      . sulfamethoxazole-trimethoprim (BACTRIM,SEPTRA) 400-80 MG per tablet 1 tablet  1 tablet Oral Q12H Oretha Milch D, MD   1 tablet at 03/13/18 0837  . tamsulosin (FLOMAX) capsule 0.4 mg  0.4 mg Oral Daily Gary Fleet, PA-C   0.4 mg at 03/12/18 1823     Discharge Medications: Please see discharge summary for a list of discharge medications.  Relevant Imaging Results:  Relevant Lab Results:   Additional Information SSN: [id number]  Alberteen Sam, LCSW

## 2018-03-14 LAB — AEROBIC/ANAEROBIC CULTURE W GRAM STAIN (SURGICAL/DEEP WOUND): Culture: NO GROWTH

## 2018-03-14 LAB — AEROBIC/ANAEROBIC CULTURE (SURGICAL/DEEP WOUND)

## 2018-03-16 DIAGNOSIS — R278 Other lack of coordination: Secondary | ICD-10-CM | POA: Diagnosis not present

## 2018-03-16 DIAGNOSIS — M6389 Disorders of muscle in diseases classified elsewhere, multiple sites: Secondary | ICD-10-CM | POA: Diagnosis not present

## 2018-03-16 DIAGNOSIS — M25552 Pain in left hip: Secondary | ICD-10-CM | POA: Diagnosis not present

## 2018-03-16 DIAGNOSIS — R600 Localized edema: Secondary | ICD-10-CM | POA: Diagnosis not present

## 2018-03-16 DIAGNOSIS — S728X2S Other fracture of left femur, sequela: Secondary | ICD-10-CM | POA: Diagnosis not present

## 2018-03-16 DIAGNOSIS — R2689 Other abnormalities of gait and mobility: Secondary | ICD-10-CM | POA: Diagnosis not present

## 2018-03-16 DIAGNOSIS — Z471 Aftercare following joint replacement surgery: Secondary | ICD-10-CM | POA: Diagnosis not present

## 2018-03-16 DIAGNOSIS — M62562 Muscle wasting and atrophy, not elsewhere classified, left lower leg: Secondary | ICD-10-CM | POA: Diagnosis not present

## 2018-03-16 DIAGNOSIS — I119 Hypertensive heart disease without heart failure: Secondary | ICD-10-CM | POA: Diagnosis not present

## 2018-03-17 ENCOUNTER — Non-Acute Institutional Stay (SKILLED_NURSING_FACILITY): Payer: Medicare Other | Admitting: Internal Medicine

## 2018-03-17 ENCOUNTER — Encounter: Payer: Self-pay | Admitting: Internal Medicine

## 2018-03-17 DIAGNOSIS — N401 Enlarged prostate with lower urinary tract symptoms: Secondary | ICD-10-CM | POA: Diagnosis not present

## 2018-03-17 DIAGNOSIS — I872 Venous insufficiency (chronic) (peripheral): Secondary | ICD-10-CM | POA: Diagnosis not present

## 2018-03-17 DIAGNOSIS — R278 Other lack of coordination: Secondary | ICD-10-CM | POA: Diagnosis not present

## 2018-03-17 DIAGNOSIS — R2689 Other abnormalities of gait and mobility: Secondary | ICD-10-CM | POA: Diagnosis not present

## 2018-03-17 DIAGNOSIS — I10 Essential (primary) hypertension: Secondary | ICD-10-CM

## 2018-03-17 DIAGNOSIS — R600 Localized edema: Secondary | ICD-10-CM | POA: Diagnosis not present

## 2018-03-17 DIAGNOSIS — N3000 Acute cystitis without hematuria: Secondary | ICD-10-CM | POA: Diagnosis not present

## 2018-03-17 DIAGNOSIS — M6389 Disorders of muscle in diseases classified elsewhere, multiple sites: Secondary | ICD-10-CM | POA: Diagnosis not present

## 2018-03-17 DIAGNOSIS — E7800 Pure hypercholesterolemia, unspecified: Secondary | ICD-10-CM | POA: Diagnosis not present

## 2018-03-17 DIAGNOSIS — D500 Iron deficiency anemia secondary to blood loss (chronic): Secondary | ICD-10-CM | POA: Diagnosis not present

## 2018-03-17 DIAGNOSIS — M25552 Pain in left hip: Secondary | ICD-10-CM | POA: Diagnosis not present

## 2018-03-17 DIAGNOSIS — I2510 Atherosclerotic heart disease of native coronary artery without angina pectoris: Secondary | ICD-10-CM

## 2018-03-17 DIAGNOSIS — S72002A Fracture of unspecified part of neck of left femur, initial encounter for closed fracture: Secondary | ICD-10-CM | POA: Diagnosis not present

## 2018-03-17 DIAGNOSIS — R3914 Feeling of incomplete bladder emptying: Secondary | ICD-10-CM

## 2018-03-17 DIAGNOSIS — M62562 Muscle wasting and atrophy, not elsewhere classified, left lower leg: Secondary | ICD-10-CM | POA: Diagnosis not present

## 2018-03-17 NOTE — Progress Notes (Signed)
Provider:  Rexene Edison. Mariea Clonts, D.O., C.M.D. Location:  [facility] Room Number: [place] of Service:  SNF (31)  PCP: Shon Baton, MD Patient Care Team: Shon Baton, MD as PCP - General (Internal Medicine)  Extended Emergency Contact Information Primary Emergency Contact: [facility] Address: [address]          [place], [place] [address] [name] of [place] Phone: [phone] Relation: Spouse  Code Status: DNR Goals of Care: Advanced Directive information Advanced Directives 03/17/2018  Does Patient Have a Medical Advance Directive? Yes  Type of Advance Directive Living will;Out of facility DNR (pink MOST or yellow form);Healthcare Power of Attorney  Does patient want to make changes to medical advance directive? No - Patient declined  Copy of [place] in Chart? Yes - validated most recent copy scanned in chart (See row information)  Would patient like information on creating a medical advance directive? No - Patient declined  Pre-existing out of facility DNR order (yellow form or pink MOST form) Yellow form placed in chart (order not valid for inpatient use);Pink MOST form placed in chart (order not valid for inpatient use)   Chief Complaint  Patient presents with  . New Admit To SNF    Rehab admission    HPI: Patient is a 83 y.o. male [place] resident who normally lives at home with his wife seen today for admission to [facility] rehab s/p hospitalization from 12/7-13 with left femur fx. He has a h/o CAD with stents 20 yrs ago followed by Dr. Martinique, htn, hyperlipidemia.  He had seen an orthopedic specialist outpatient who diagnosed a stress fracture after 2 weeks of progressively increasing left hip pain requiring walker use (baseline ambulatory w/o assistive device).  He was treated with pain medications, but not getting relief so present to ED on 12/7.  There, he was mildly tachycardic and mildly  hyponatremic.  Pelvic xray revealed displaced fx left femoral neck.  Dr. Berenice Primas was consulted and pt was admitted to hospitalist service with future surgery planned.  He was given NS and morphine.  EKG showed some nonspecific diffuse T wave changes.  He was a full code there.  He also had a swollen left knee.  He underwent a left total hip replacement and aspiration of his left knee (90cc cloudy fluid) on 03/09/18 with Dr. Berenice Primas. Ortho recommended WBAT to left hip, therapy at SNF, ASA 325mg  po bid for one month postop and f/u with Dr. Berenice Primas in 1 week.  Cultures of left knee aspirate remain negative and pain in knee improved.    He has a h/o BPH with LUTS on flomax and finasteride followed by urology, Dr. Louis Meckel.  His stay was complicated by some incomplete bladder emptying.  He had multiple I/O caths but finally required a foley placement.  He is on bactrim po bid b/c of urinary frequency, as well, and is to be seen in urologic f/u with Dr. Louis Meckel in 1 week from hospital d/c 12/13--he was sent with a full one month supply of the bacrim.  Apparently, he does chronically take considerable time starting his stream per his wife.    He has been up walking to the dining area of rehab and around the unit with someone with him and his walker since he had his PT eval here yesterday.    He normally sleeps well at home, but is struggling to stay asleep here.  He feels somewhat claustrophobic in his room here.  This is unusual for him.     He also has a h/o venous stasis with stocking use recommended, osteoarthritis of his knees bilaterally with prior injections, nsaids follow by Dr. Lynann Bologna, thrombocytopenia, and senile purpura.  He also has OSA and uses CPAP at hs.  When seen today, he reports no pain at his hip incision site.  He has some persistent swelling of his left thigh and left foot/ankle, very minimal of left knee.    Bowels moved well this morning after some warm prune juice and some time on the  commode.  He had a bm before that just 2 days ago.  He does typically go daily without difficulty.  MMSE has been done and he scored 30/30, but failed his clock drawing--only two numbers were on it and the hands were shaky and not set properly.  He wears glasses and uses hearing aids.  He has dental implants.  Past Medical History:  Diagnosis Date  . Arthritis   . Bruises easily   . CAD (coronary artery disease)   . Colon polyps   . Groin pain    Right  . Hypercholesterolemia    takes Lipitor daily  . Hypertension    takes Amlodipine,Ramipril,and Metoprolol daily  . Impaired hearing   . Joint pain   . Joint swelling   . Myocardial infarction ([place])    2003  . Osteoarthritis    Right hip  . Sleep apnea    uses CPAP;sleep study done > 43yrs ago   Past Surgical History:  Procedure Laterality Date  . CARDIAC CATHETERIZATION  >72yrs ago  . CARTILAGE SURGERY  1990   Right knee  . COLONOSCOPY    . CORONARY ANGIOPLASTY     1 stent  . KNEE SURGERY     bilateral  . TOTAL HIP ARTHROPLASTY  05/15/2011   Procedure: TOTAL HIP ARTHROPLASTY;  Surgeon: Kerin Salen, MD;  Location: [facility];  Service: Orthopedics;  Laterality: Right;  DEPUY/SROM/PINNACLE  . TOTAL HIP ARTHROPLASTY Left 03/09/2018   Procedure: TOTAL HIP ARTHROPLASTY ANTERIOR APPROACH LEFT HIP Aspiration Fluid LEFT KNEE;  Surgeon: Dorna Leitz, MD;  Location: [facility];  Service: Orthopedics;  Laterality: Left;    Social History   Socioeconomic History  . Marital status: Married    Spouse name: Not on file  . Number of children: 3  . Years of education: Not on file  . Highest education level: Not on file  Occupational History  . Occupation: VP Ambulance person: RETIRED    Comment: [employer]  . Financial resource strain: Not on file  . Food insecurity:    Worry: Not on file    Inability: Not on file  . Transportation needs:    Medical: Not on file    Non-medical: Not on file  Tobacco  Use  . Smoking status: Former Research scientist (life sciences)  . Smokeless tobacco: Never Used  . Tobacco comment: quit 65yrs ago  Substance and Sexual Activity  . Alcohol use: Yes    Comment: drinks 7 bottles wine a week  . Drug use: No  . Sexual activity: Not Currently  Lifestyle  . Physical activity:    Days per week: Not on file    Minutes per session: Not on file  . Stress: Not on file  Relationships  . Social connections:    Talks on phone: Not on file    Gets together: Not on file    Attends religious  service: Not on file    Active member of club or organization: Not on file    Attends meetings of clubs or organizations: Not on file    Relationship status: Not on file  Other Topics Concern  . Not on file  Social History Narrative  . Not on file    reports that he has quit smoking. He has never used smokeless tobacco. He reports current alcohol use. He reports that he does not use drugs.  Functional Status Survey:    Family History  Problem Relation Age of Onset  . Heart disease Brother   . Colon cancer Brother   . Anesthesia problems Neg Hx   . Hypotension Neg Hx   . Malignant hyperthermia Neg Hx   . Pseudochol deficiency Neg Hx   . Stomach cancer Neg Hx     Health Maintenance  Topic Date Due  . TETANUS/TDAP  12/02/1955  . PNA vac Low Risk Adult (1 of 2 - PCV13) 12/01/2001  . COLONOSCOPY  01/14/2017  . INFLUENZA VACCINE  10/30/2017    No Known Allergies  Outpatient Encounter Medications as of 03/17/2018  Medication Sig  . amLODipine (NORVASC) 10 MG tablet Take 1 tablet (10 mg total) by mouth daily.  Marland Kitchen aspirin EC 325 MG tablet Take 1 tablet (325 mg total) by mouth 2 (two) times daily after a meal. Take x 1 month post op to decrease risk of blood clots.  Marland Kitchen atorvastatin (LIPITOR) 40 MG tablet Take 1 tablet (40 mg total) by mouth daily.  Marland Kitchen docusate sodium (COLACE) 100 MG capsule Take 1 capsule (100 mg total) by mouth 2 (two) times daily.  . finasteride (PROSCAR) 5 MG tablet Take  1 tablet (5 mg total) by mouth daily.  Marland Kitchen HYDROcodone-acetaminophen (NORCO/VICODIN) 5-325 MG tablet Take 1-2 tablets by mouth every 4 (four) hours as needed for up to 5 days for moderate pain (pain score 4-6).  Marland Kitchen methocarbamol (ROBAXIN) 500 MG tablet Take 1 tablet (500 mg total) by mouth every 6 (six) hours as needed for muscle spasms.  . metoprolol succinate (TOPROL-XL) 100 MG 24 hr tablet Take 1 tablet (100 mg total) by mouth daily. Take with or immediately following a meal.  . polyethylene glycol (MIRALAX / GLYCOLAX) packet Take 17 g by mouth daily as needed for mild constipation.  . senna-docusate (SENOKOT-S) 8.6-50 MG tablet Take 1 tablet by mouth at bedtime as needed for mild constipation.  . sulfamethoxazole-trimethoprim (BACTRIM,SEPTRA) 400-80 MG tablet Take 1 tablet by mouth 2 (two) times daily.  . tamsulosin (FLOMAX) 0.4 MG CAPS capsule Take 1 capsule (0.4 mg total) by mouth daily.   No facility-administered encounter medications on file as of 03/17/2018.     Review of Systems  Constitutional: Negative for chills, fever and malaise/fatigue.  HENT: Positive for hearing loss.        Hearing aids  Eyes:       Glasses  Respiratory: Negative for cough and shortness of breath.   Cardiovascular: Positive for leg swelling. Negative for chest pain and palpitations.       Surgical leg greater than opposite leg  Genitourinary: Positive for dysuria, frequency and urgency.       Incomplete bladder emptying and difficulty starting stream/hesitancy; catheter in place currently  Musculoskeletal: Positive for joint pain. Negative for back pain, falls, myalgias and neck pain.       Using walker since two weeks before surgery when hip pain began  Skin: Negative for itching and rash.  Neurological:  Negative for loss of consciousness and headaches.  Psychiatric/Behavioral: Negative for depression and memory loss. The patient is not nervous/anxious and does not have insomnia.        Failed clock  drawing but scored 30/30 on mmse otherwise    Vitals:   03/17/18 1007  BP: 105/60  Pulse: 84  Resp: 18  Temp: 98.5 F (36.9 C)  TempSrc: Oral  SpO2: 98%  Weight: 188 lb (85.3 kg)  Height: 5\' 10"  (1.778 m)   Body mass index is 26.98 kg/m. Physical Exam Constitutional:      Appearance: Normal appearance. He is obese.  HENT:     Head: Normocephalic and atraumatic.     Right Ear: External ear normal.     Left Ear: External ear normal.     Ears:     Comments: Hearing aids    Nose: Nose normal.     Mouth/Throat:     Mouth: Mucous membranes are moist.     Pharynx: Oropharynx is clear.  Eyes:     Extraocular Movements: Extraocular movements intact.     Conjunctiva/sclera: Conjunctivae normal.     Pupils: Pupils are equal, round, and reactive to light.     Comments: glasses  Neck:     Musculoskeletal: Neck supple.  Cardiovascular:     Rate and Rhythm: Normal rate and regular rhythm.     Pulses: Normal pulses.     Heart sounds: Normal heart sounds.  Pulmonary:     Effort: Pulmonary effort is normal. No respiratory distress.     Breath sounds: No wheezing or rales.  Abdominal:     General: Bowel sounds are normal. There is no distension.     Palpations: Abdomen is soft. There is no mass.     Tenderness: There is no abdominal tenderness. There is no right CVA tenderness, left CVA tenderness, guarding or rebound.  Genitourinary:    Comments: Foley draining dark yellow urine into left leg bag Musculoskeletal: Normal range of motion.        General: Swelling present. No tenderness.     Right lower leg: Edema present.     Left lower leg: Edema present.     Comments: Walking with rolling walker with skis; 2+ left foot and ankle, mild knee and mild thigh swelling vs right (slight swelling of right foot and ankle, too, 1+)  Lymphadenopathy:     Cervical: No cervical adenopathy.  Skin:    General: Skin is warm and dry.     Capillary Refill: Capillary refill takes less than 2  seconds.     Comments: Ecchymoses of bilateral forearms  Neurological:     General: No focal deficit present.     Mental Status: He is alert and oriented to person, place, and time.     Cranial Nerves: No cranial nerve deficit.     Motor: No weakness.     Gait: Gait normal.  Psychiatric:        Mood and Affect: Mood normal.        Behavior: Behavior normal.        Thought Content: Thought content normal.        Judgment: Judgment normal.     Labs reviewed: Basic Metabolic Panel: Recent Labs    03/07/18 2154 03/10/18 0234  NA 133* 137  K 3.9 3.6  CL 98 101  CO2 20* 26  GLUCOSE 124* 169*  BUN 18 16  CREATININE 0.99 0.75  CALCIUM 8.7* 8.1*   Liver Function  Tests: No results for input(s): AST, ALT, ALKPHOS, BILITOT, PROT, ALBUMIN in the last 8760 hours. No results for input(s): LIPASE, AMYLASE in the last 8760 hours. No results for input(s): AMMONIA in the last 8760 hours. CBC: Recent Labs    03/07/18 2154 03/10/18 0234 03/11/18 0230 03/12/18 0228  WBC 14.1* 11.8* 13.0* 8.4  NEUTROABS 11.8*  --   --   --   HGB 13.4 11.1* 9.8* 8.9*  HCT 41.6 33.3* 29.3* 27.7*  MCV 95.6 92.5 93.0 94.2  PLT 189 189 198 210   Cardiac Enzymes: No results for input(s): CKTOTAL, CKMB, CKMBINDEX, TROPONINI in the last 8760 hours. BNP: Invalid input(s): POCBNP No results found for: HGBA1C No results found for: TSH No results found for: VITAMINB12 No results found for: FOLATE No results found for: IRON, TIBC, FERRITIN  Imaging and Procedures obtained prior to SNF admission: Dg Chest 1 View  Result Date: 03/07/2018 CLINICAL DATA:  Hip fxhip fracture EXAM: CHEST  1 VIEW COMPARISON:  None. FINDINGS: Normal mediastinum and cardiac silhouette. Normal pulmonary vasculature. Prominent vascular shadow along the superior RIGHT mediastinum unchanged. No evidence of effusion, infiltrate, or pneumothorax. No acute bony abnormality. IMPRESSION: No acute cardiopulmonary process. Electronically  Signed   By: Suzy Bouchard M.D.   On: 03/07/2018 22:58   Dg Hip Unilat W Or Wo Pelvis 2-3 Views Left  Result Date: 03/07/2018 CLINICAL DATA:  83 year old male with left hip pain. EXAM: DG HIP (WITH OR WITHOUT PELVIS) 2-3V LEFT COMPARISON:  Pelvic radiograph dated 05/15/2011 FINDINGS: There is a displaced fracture of the left femoral neck with superior migration of the femur and impaction on the lateral acetabular roof. The left femoral head is poorly visualized due to advanced osteopenia but appears to be within the acetabulum. There is a total right hip arthroplasty which appears intact. IMPRESSION: 1. Displaced fracture of the left femoral neck. 2. Total right hip arthroplasty appears intact. Electronically Signed   By: Anner Crete M.D.   On: 03/07/2018 22:58    Assessment/Plan 1. Fracture of unspecified part of neck of left femur, initial encounter for closed fracture ([place]) -s/p left total hip replacement by Dr. Berenice Primas -f/u on Monday with him -cont PT, doing great -has some blood visible thru dressing and mild swelling but nothing worrisome  2. Benign prostatic hyperplasia with incomplete bladder emptying -f/u Dr. Louis Meckel tomorrow in hopes catheter can be removed -note that he has significant difficulty starting his stream long term despite his medications according to his wife--she will be in and out of the bathroom before he starts to urinate  3. Anemia due to blood loss -was for cbc this am, but lab did not draw--will do in am  4. Coronary artery disease involving native heart without angina pectoris, unspecified vessel or lesion type -many years ago s/p stents, has done fine since--cont secondary MI prevention  5. Essential hypertension -bp well controlled at present, cont same regimen  6. Hypercholesterolemia -cont lipid mgt per pcp  7. Acute cystitis without hematuria -cont bactrim until Dr. Louis Meckel sees him--a whole month's supply was written at the hospital  8.   Chronic venous insufficiency: TEDS have been ordered Note pt did not eat well when he was hurting either so some of swelling may be related to malnutrition, but he's back to eating very well since his surgery  Family/ staff Communication: pts wife and daughter were present for visit; discussed with SNF nurse and reviewed hospital records and [facility]/PCP records  Labs/tests ordered:  Cbc pending   Nashly Olsson L. Khalessi Blough, D.O. [facility] [address], [place] [address] Cell Phone (Mon-Fri 8am-5pm):  [phone] On Call:  [phone] & follow prompts after 5pm & weekends Office Phone:  [phone] Office Fax:  [phone]

## 2018-03-18 DIAGNOSIS — Z79899 Other long term (current) drug therapy: Secondary | ICD-10-CM | POA: Diagnosis not present

## 2018-03-18 DIAGNOSIS — M62562 Muscle wasting and atrophy, not elsewhere classified, left lower leg: Secondary | ICD-10-CM | POA: Diagnosis not present

## 2018-03-18 DIAGNOSIS — M6389 Disorders of muscle in diseases classified elsewhere, multiple sites: Secondary | ICD-10-CM | POA: Diagnosis not present

## 2018-03-18 DIAGNOSIS — R338 Other retention of urine: Secondary | ICD-10-CM | POA: Diagnosis not present

## 2018-03-18 DIAGNOSIS — I872 Venous insufficiency (chronic) (peripheral): Secondary | ICD-10-CM | POA: Insufficient documentation

## 2018-03-18 DIAGNOSIS — D649 Anemia, unspecified: Secondary | ICD-10-CM | POA: Diagnosis not present

## 2018-03-18 DIAGNOSIS — R600 Localized edema: Secondary | ICD-10-CM | POA: Diagnosis not present

## 2018-03-18 DIAGNOSIS — M25552 Pain in left hip: Secondary | ICD-10-CM | POA: Diagnosis not present

## 2018-03-18 DIAGNOSIS — R278 Other lack of coordination: Secondary | ICD-10-CM | POA: Diagnosis not present

## 2018-03-18 DIAGNOSIS — R2689 Other abnormalities of gait and mobility: Secondary | ICD-10-CM | POA: Diagnosis not present

## 2018-03-18 LAB — CBC AND DIFFERENTIAL
HCT: 34 — AB (ref 41–53)
Hemoglobin: 11.2 — AB (ref 13.5–17.5)
Platelets: 403 — AB (ref 150–399)
WBC: 10.6

## 2018-03-19 DIAGNOSIS — M6389 Disorders of muscle in diseases classified elsewhere, multiple sites: Secondary | ICD-10-CM | POA: Diagnosis not present

## 2018-03-19 DIAGNOSIS — M25552 Pain in left hip: Secondary | ICD-10-CM | POA: Diagnosis not present

## 2018-03-19 DIAGNOSIS — R2689 Other abnormalities of gait and mobility: Secondary | ICD-10-CM | POA: Diagnosis not present

## 2018-03-19 DIAGNOSIS — M62562 Muscle wasting and atrophy, not elsewhere classified, left lower leg: Secondary | ICD-10-CM | POA: Diagnosis not present

## 2018-03-19 DIAGNOSIS — R278 Other lack of coordination: Secondary | ICD-10-CM | POA: Diagnosis not present

## 2018-03-19 DIAGNOSIS — R600 Localized edema: Secondary | ICD-10-CM | POA: Diagnosis not present

## 2018-03-20 DIAGNOSIS — R600 Localized edema: Secondary | ICD-10-CM | POA: Diagnosis not present

## 2018-03-20 DIAGNOSIS — R2689 Other abnormalities of gait and mobility: Secondary | ICD-10-CM | POA: Diagnosis not present

## 2018-03-20 DIAGNOSIS — M6389 Disorders of muscle in diseases classified elsewhere, multiple sites: Secondary | ICD-10-CM | POA: Diagnosis not present

## 2018-03-20 DIAGNOSIS — M62562 Muscle wasting and atrophy, not elsewhere classified, left lower leg: Secondary | ICD-10-CM | POA: Diagnosis not present

## 2018-03-20 DIAGNOSIS — M25552 Pain in left hip: Secondary | ICD-10-CM | POA: Diagnosis not present

## 2018-03-20 DIAGNOSIS — R278 Other lack of coordination: Secondary | ICD-10-CM | POA: Diagnosis not present

## 2018-03-22 DIAGNOSIS — M62562 Muscle wasting and atrophy, not elsewhere classified, left lower leg: Secondary | ICD-10-CM | POA: Diagnosis not present

## 2018-03-22 DIAGNOSIS — R278 Other lack of coordination: Secondary | ICD-10-CM | POA: Diagnosis not present

## 2018-03-22 DIAGNOSIS — M25552 Pain in left hip: Secondary | ICD-10-CM | POA: Diagnosis not present

## 2018-03-22 DIAGNOSIS — R2689 Other abnormalities of gait and mobility: Secondary | ICD-10-CM | POA: Diagnosis not present

## 2018-03-22 DIAGNOSIS — M6389 Disorders of muscle in diseases classified elsewhere, multiple sites: Secondary | ICD-10-CM | POA: Diagnosis not present

## 2018-03-22 DIAGNOSIS — R600 Localized edema: Secondary | ICD-10-CM | POA: Diagnosis not present

## 2018-03-23 DIAGNOSIS — M25552 Pain in left hip: Secondary | ICD-10-CM | POA: Diagnosis not present

## 2018-03-23 DIAGNOSIS — M25462 Effusion, left knee: Secondary | ICD-10-CM | POA: Diagnosis not present

## 2018-03-23 DIAGNOSIS — M25551 Pain in right hip: Secondary | ICD-10-CM | POA: Diagnosis not present

## 2018-03-24 DIAGNOSIS — M25552 Pain in left hip: Secondary | ICD-10-CM | POA: Diagnosis not present

## 2018-03-24 DIAGNOSIS — R278 Other lack of coordination: Secondary | ICD-10-CM | POA: Diagnosis not present

## 2018-03-24 DIAGNOSIS — M62562 Muscle wasting and atrophy, not elsewhere classified, left lower leg: Secondary | ICD-10-CM | POA: Diagnosis not present

## 2018-03-24 DIAGNOSIS — R2689 Other abnormalities of gait and mobility: Secondary | ICD-10-CM | POA: Diagnosis not present

## 2018-03-24 DIAGNOSIS — M6389 Disorders of muscle in diseases classified elsewhere, multiple sites: Secondary | ICD-10-CM | POA: Diagnosis not present

## 2018-03-24 DIAGNOSIS — R600 Localized edema: Secondary | ICD-10-CM | POA: Diagnosis not present

## 2018-03-26 DIAGNOSIS — M62562 Muscle wasting and atrophy, not elsewhere classified, left lower leg: Secondary | ICD-10-CM | POA: Diagnosis not present

## 2018-03-26 DIAGNOSIS — R278 Other lack of coordination: Secondary | ICD-10-CM | POA: Diagnosis not present

## 2018-03-26 DIAGNOSIS — R600 Localized edema: Secondary | ICD-10-CM | POA: Diagnosis not present

## 2018-03-26 DIAGNOSIS — R2689 Other abnormalities of gait and mobility: Secondary | ICD-10-CM | POA: Diagnosis not present

## 2018-03-26 DIAGNOSIS — M25552 Pain in left hip: Secondary | ICD-10-CM | POA: Diagnosis not present

## 2018-03-26 DIAGNOSIS — M6389 Disorders of muscle in diseases classified elsewhere, multiple sites: Secondary | ICD-10-CM | POA: Diagnosis not present

## 2018-03-27 DIAGNOSIS — M6389 Disorders of muscle in diseases classified elsewhere, multiple sites: Secondary | ICD-10-CM | POA: Diagnosis not present

## 2018-03-27 DIAGNOSIS — R278 Other lack of coordination: Secondary | ICD-10-CM | POA: Diagnosis not present

## 2018-03-27 DIAGNOSIS — R600 Localized edema: Secondary | ICD-10-CM | POA: Diagnosis not present

## 2018-03-27 DIAGNOSIS — M25552 Pain in left hip: Secondary | ICD-10-CM | POA: Diagnosis not present

## 2018-03-27 DIAGNOSIS — M62562 Muscle wasting and atrophy, not elsewhere classified, left lower leg: Secondary | ICD-10-CM | POA: Diagnosis not present

## 2018-03-27 DIAGNOSIS — R2689 Other abnormalities of gait and mobility: Secondary | ICD-10-CM | POA: Diagnosis not present

## 2018-03-30 DIAGNOSIS — M25552 Pain in left hip: Secondary | ICD-10-CM | POA: Diagnosis not present

## 2018-03-30 DIAGNOSIS — M62562 Muscle wasting and atrophy, not elsewhere classified, left lower leg: Secondary | ICD-10-CM | POA: Diagnosis not present

## 2018-03-30 DIAGNOSIS — R2689 Other abnormalities of gait and mobility: Secondary | ICD-10-CM | POA: Diagnosis not present

## 2018-03-30 DIAGNOSIS — M6389 Disorders of muscle in diseases classified elsewhere, multiple sites: Secondary | ICD-10-CM | POA: Diagnosis not present

## 2018-03-30 DIAGNOSIS — R600 Localized edema: Secondary | ICD-10-CM | POA: Diagnosis not present

## 2018-03-30 DIAGNOSIS — R278 Other lack of coordination: Secondary | ICD-10-CM | POA: Diagnosis not present

## 2018-03-31 ENCOUNTER — Encounter: Payer: Self-pay | Admitting: Internal Medicine

## 2018-03-31 ENCOUNTER — Non-Acute Institutional Stay (SKILLED_NURSING_FACILITY): Payer: Medicare Other | Admitting: Internal Medicine

## 2018-03-31 DIAGNOSIS — S72002D Fracture of unspecified part of neck of left femur, subsequent encounter for closed fracture with routine healing: Secondary | ICD-10-CM | POA: Diagnosis not present

## 2018-03-31 DIAGNOSIS — M1852 Other unilateral secondary osteoarthritis of first carpometacarpal joint, left hand: Secondary | ICD-10-CM

## 2018-03-31 DIAGNOSIS — M6389 Disorders of muscle in diseases classified elsewhere, multiple sites: Secondary | ICD-10-CM | POA: Diagnosis not present

## 2018-03-31 DIAGNOSIS — N401 Enlarged prostate with lower urinary tract symptoms: Secondary | ICD-10-CM | POA: Diagnosis not present

## 2018-03-31 DIAGNOSIS — I2510 Atherosclerotic heart disease of native coronary artery without angina pectoris: Secondary | ICD-10-CM | POA: Diagnosis not present

## 2018-03-31 DIAGNOSIS — M25552 Pain in left hip: Secondary | ICD-10-CM | POA: Diagnosis not present

## 2018-03-31 DIAGNOSIS — D500 Iron deficiency anemia secondary to blood loss (chronic): Secondary | ICD-10-CM

## 2018-03-31 DIAGNOSIS — R600 Localized edema: Secondary | ICD-10-CM | POA: Diagnosis not present

## 2018-03-31 DIAGNOSIS — R3914 Feeling of incomplete bladder emptying: Secondary | ICD-10-CM

## 2018-03-31 DIAGNOSIS — M62562 Muscle wasting and atrophy, not elsewhere classified, left lower leg: Secondary | ICD-10-CM | POA: Diagnosis not present

## 2018-03-31 DIAGNOSIS — N3000 Acute cystitis without hematuria: Secondary | ICD-10-CM

## 2018-03-31 DIAGNOSIS — R2689 Other abnormalities of gait and mobility: Secondary | ICD-10-CM | POA: Diagnosis not present

## 2018-03-31 DIAGNOSIS — R278 Other lack of coordination: Secondary | ICD-10-CM | POA: Diagnosis not present

## 2018-03-31 NOTE — Progress Notes (Signed)
Patient ID: Jeremy Sawyer, male   DOB: Mar 02, 1935, 83 y.o.   MRN: [id number]  Location:  [facility] Room Number: [place] of Service:  SNF (31)  Provider: Madalynn Pickelsimer L. Mariea Sawyer, D.O., C.M.D.  PCP: Jeremy Baton, MD Patient Care Team: Jeremy Baton, MD as PCP - General (Internal Medicine)  Extended Emergency Contact Information Primary Emergency Contact: Jeremy Sawyer Address: [place]          [place],  [address] [place] of [place] Phone: [phone] Relation: Spouse  Code Status: DNR Goals of care:  Advanced Directive information Advanced Directives 03/31/2018  Does Patient Have a Medical Advance Directive? Yes  Type of Paramedic of [place];Living will;Out of facility DNR (pink MOST or yellow form)  Does patient want to make changes to medical advance directive? No - Patient declined  Copy of [place] in Chart? Yes - validated most recent copy scanned in chart (See row information)  Would patient like information on creating a medical advance directive? No - Patient declined  Pre-existing out of facility DNR order (yellow form or pink MOST form) Yellow form placed in chart (order not valid for inpatient use);Pink MOST form placed in chart (order not valid for inpatient use)     No Known Allergies  Chief Complaint  Patient presents with  . Discharge Note    discharge home    HPI:  83 y.o. male  With h/o CAD s/p stents about 20 yrs ago, htn, hyperlipidemia, OSA on CPAP, chronic bilateral knee OA, thrombocytopenia, senile purpura was here for rehab from 12/13 thru 12/31 after left femur fracture.  He had seen his orthopedic specialist Jeremy who diagnosed a stress fx after 2 wks of increasing left hip pain requiring him to use a walker (normally no assistive device).  He was treated with pain meds, but not getting relief so had then presented to the ED 12/7.  He was  mildly tachy and hyponatremic there.  Pelvic xrays showed displaced fx of the left femoral neck.  Dr. Berenice Primas from ortho was consulted and he was admitted under the hospitalist service.  He was given NS and morphine.  EKG showed nonspecific diffuse T wave changes, but no acute ischemia or infarct.  He underwent left total hip replacement and aspiration of the left knee (which was swollen and painful) 12/9 by Dr. Berenice Primas.  It yielded 90 cc of cloudy fluid with negative cultures.  He was given orders for WBAT to left hip, therapy at SNF, ASA 325mg  po bid for one month postop and f/u with Dr. Berenice Primas in one week.    His stay had been complicated by incomplete bladder emptying which was not entirely new for him.  He had I/o caths but ultimately foley placement.  He was placed on bactrim in case of UTI and then f/u with Dr. Louis Meckel.  When seen for admission to [facility] rehab 12/17, he had minimal pain and had only been using tylenol except at hs.  He'd already been walking around the unit and to meals in the dining area with PT here and walker.    He struggled some with claustrophobia that led to insomnia while here and was given melatonin 5mg  as needed at hs and that worked well.  He typically does fine with sleeping at home.  He did very well with rehab and is eager to get home.  He did well at his f/u with Dr. Berenice Primas.  There were  no concerns.  He also had his foley d/cd at Dr. Carlton Adam office.  He still struggles some with delayed stream and is to be voiding every 2 hrs to prevent retention.    He had a new concern at the time of discharge:  Noted left base of thumb erythema, swelling and tenderness, painful when putting pressure on it using the rollator walker (?cause).  He's begun to apply ice and has tylenol if needed.    Past Medical History:  Diagnosis Date  . Arthritis   . Bruises easily   . CAD (coronary artery disease)   . Colon polyps   . Groin pain    Right  . Hypercholesterolemia    takes  Lipitor daily  . Hypertension    takes Amlodipine,Ramipril,and Metoprolol daily  . Impaired hearing   . Joint pain   . Joint swelling   . Myocardial infarction ([place])    2003  . Osteoarthritis    Right hip  . Sleep apnea    uses CPAP;sleep study done > 25yrs ago    Past Surgical History:  Procedure Laterality Date  . CARDIAC CATHETERIZATION  >52yrs ago  . CARTILAGE SURGERY  1990   Right knee  . COLONOSCOPY    . CORONARY ANGIOPLASTY     1 stent  . KNEE SURGERY     bilateral  . TOTAL HIP ARTHROPLASTY  05/15/2011   Procedure: TOTAL HIP ARTHROPLASTY;  Surgeon: Kerin Salen, MD;  Location: [place];  Service: Orthopedics;  Laterality: Right;  DEPUY/SROM/PINNACLE  . TOTAL HIP ARTHROPLASTY Left 03/09/2018   Procedure: TOTAL HIP ARTHROPLASTY ANTERIOR APPROACH LEFT HIP Aspiration Fluid LEFT KNEE;  Surgeon: Dorna Leitz, MD;  Location: [place];  Service: Orthopedics;  Laterality: Left;      reports that he has quit smoking. His smoking use included cigarettes. He has never used smokeless tobacco. He reports current alcohol use. He reports that he does not use drugs. Social History   Socioeconomic History  . Marital status: Married    Spouse name: Not on file  . Number of children: 3  . Years of education: Not on file  . Highest education level: Not on file  Occupational History  . Occupation: VP Ambulance person: RETIRED    Comment: [employer] [place]  . Financial resource strain: Not on file  . Food insecurity:    Worry: Not on file    Inability: Not on file  . Transportation needs:    Medical: Not on file    Non-medical: Not on file  Tobacco Use  . Smoking status: Former Smoker    Types: Cigarettes  . Smokeless tobacco: Never Used  . Tobacco comment: quit 4yrs ago  Substance and Sexual Activity  . Alcohol use: Yes    Comment: drinks 7 glasses wine a week  . Drug use: No  . Sexual activity: Not Currently  Lifestyle  . Physical activity:    Days  per week: Not on file    Minutes per session: Not on file  . Stress: Not on file  Relationships  . Social connections:    Talks on phone: Not on file    Gets together: Not on file    Attends religious service: Not on file    Active member of club or organization: Not on file    Attends meetings of clubs or organizations: Not on file    Relationship status: Not on file  . Intimate partner  violence:    Fear of current or ex partner: Not on file    Emotionally abused: Not on file    Physically abused: Not on file    Forced sexual activity: Not on file  Other Topics Concern  . Not on file  Social History Narrative  . Not on file   Functional Status Survey:    No Known Allergies  Pertinent  Health Maintenance Due  Topic Date Due  . COLONOSCOPY  01/14/2017  . INFLUENZA VACCINE  Completed  . PNA vac Low Risk Adult  Completed    Medications: Jeremy Encounter Medications as of 03/31/2018  Medication Sig  . acetaminophen (TYLENOL) 325 MG tablet Take 650 mg by mouth as needed.  Marland Kitchen amLODipine (NORVASC) 10 MG tablet Take 1 tablet (10 mg total) by mouth daily.  Marland Kitchen aspirin EC 325 MG tablet Take 1 tablet (325 mg total) by mouth 2 (two) times daily after a meal. Take x 1 month post op to decrease risk of blood clots.  Marland Kitchen atorvastatin (LIPITOR) 40 MG tablet Take 1 tablet (40 mg total) by mouth daily.  Marland Kitchen docusate sodium (COLACE) 100 MG capsule Take 1 capsule (100 mg total) by mouth 2 (two) times daily.  . finasteride (PROSCAR) 5 MG tablet Take 1 tablet (5 mg total) by mouth daily.  Marland Kitchen HYDROcodone-acetaminophen (NORCO/VICODIN) 5-325 MG tablet Take 1 tablet by mouth every 6 (six) hours as needed for moderate pain.  . methocarbamol (ROBAXIN) 500 MG tablet Take 1 tablet (500 mg total) by mouth every 6 (six) hours as needed for muscle spasms.  . metoprolol succinate (TOPROL-XL) 100 MG 24 hr tablet Take 1 tablet (100 mg total) by mouth daily. Take with or immediately following a meal.  .  polyethylene glycol (MIRALAX / GLYCOLAX) packet Take 17 g by mouth daily as needed for mild constipation.  . senna-docusate (SENOKOT-S) 8.6-50 MG tablet Take 1 tablet by mouth at bedtime as needed for mild constipation.  . tamsulosin (FLOMAX) 0.4 MG CAPS capsule Take 1 capsule (0.4 mg total) by mouth daily.  . [DISCONTINUED] sulfamethoxazole-trimethoprim (BACTRIM,SEPTRA) 400-80 MG tablet Take 1 tablet by mouth 2 (two) times daily.   No facility-administered encounter medications on file as of 03/31/2018.     Review of Systems  Constitutional: Negative for activity change, appetite change, fatigue and fever.  HENT: Negative for congestion and trouble swallowing.   Eyes: Negative for visual disturbance.  Respiratory: Negative for cough and shortness of breath.   Cardiovascular: Positive for leg swelling. Negative for chest pain and palpitations.  Gastrointestinal: Negative for abdominal pain, constipation, diarrhea, nausea and vomiting.  Genitourinary:       Bph with luts and incomplete bladder emptying  Musculoskeletal: Positive for arthralgias and gait problem. Negative for back pain and joint swelling.       Left hip and knee both much better, using walker w/o difficulty  Skin: Negative for color change.       Erythema base of left thumb    Vitals:   03/31/18 1011  BP: 139/71  Pulse: 81  Resp: 16  Temp: (!) 97.4 F (36.3 C)  TempSrc: Oral  SpO2: 97%  Weight: 183 lb (83 kg)  Height: 5\' 10"  (1.778 m)   Body mass index is 26.26 kg/m. Physical Exam Vitals signs and nursing note reviewed.  Constitutional:      General: He is not in acute distress.    Appearance: Normal appearance. He is obese. He is not ill-appearing or toxic-appearing.  HENT:  Head: Normocephalic and atraumatic.  Cardiovascular:     Rate and Rhythm: Normal rate and regular rhythm.     Pulses: Normal pulses.     Heart sounds: Normal heart sounds.  Pulmonary:     Effort: Pulmonary effort is normal.      Breath sounds: Normal breath sounds. No wheezing, rhonchi or rales.  Abdominal:     General: Bowel sounds are normal.     Palpations: Abdomen is soft.  Musculoskeletal:        General: Swelling present.  Skin:    General: Skin is warm and dry.     Comments: Mild erythema of base of left thumb, tender there and swollen, ROM ok; left hip incision clean and healing; left knee with only mild swelling  Neurological:     General: No focal deficit present.     Mental Status: He is alert and oriented to person, place, and time.     Cranial Nerves: No cranial nerve deficit.     Motor: No weakness.     Coordination: Coordination normal.     Gait: Gait abnormal.     Deep Tendon Reflexes: Reflexes normal.  Psychiatric:        Mood and Affect: Mood normal.        Behavior: Behavior normal.        Thought Content: Thought content normal.        Judgment: Judgment normal.     Labs reviewed: Basic Metabolic Panel: Recent Labs    03/07/18 2154 03/10/18 0234  NA 133* 137  K 3.9 3.6  CL 98 101  CO2 20* 26  GLUCOSE 124* 169*  BUN 18 16  CREATININE 0.99 0.75  CALCIUM 8.7* 8.1*   Liver Function Tests: No results for input(s): AST, ALT, ALKPHOS, BILITOT, PROT, ALBUMIN in the last 8760 hours. No results for input(s): LIPASE, AMYLASE in the last 8760 hours. No results for input(s): AMMONIA in the last 8760 hours. CBC: Recent Labs    03/07/18 2154 03/10/18 0234 03/11/18 0230 03/12/18 0228 03/18/18 0400  WBC 14.1* 11.8* 13.0* 8.4 10.6  NEUTROABS 11.8*  --   --   --   --   HGB 13.4 11.1* 9.8* 8.9* 11.2*  HCT 41.6 33.3* 29.3* 27.7* 34*  MCV 95.6 92.5 93.0 94.2  --   PLT 189 189 198 210 403*   Cardiac Enzymes: No results for input(s): CKTOTAL, CKMB, CKMBINDEX, TROPONINI in the last 8760 hours. BNP: Invalid input(s): POCBNP CBG: No results for input(s): GLUCAP in the last 8760 hours.  Procedures and Imaging Studies During Stay: Dg Chest 1 View  Result Date: 03/07/2018 CLINICAL  DATA:  Hip fxhip fracture EXAM: CHEST  1 VIEW COMPARISON:  None. FINDINGS: Normal mediastinum and cardiac silhouette. Normal pulmonary vasculature. Prominent vascular shadow along the superior RIGHT mediastinum unchanged. No evidence of effusion, infiltrate, or pneumothorax. No acute bony abnormality. IMPRESSION: No acute cardiopulmonary process. Electronically Signed   By: Suzy Bouchard M.D.   On: 03/07/2018 22:58   Dg C-arm 1-60 Min  Result Date: 03/09/2018 CLINICAL DATA:  Hip fracture EXAM: DG C-ARM 61-120 MIN; OPERATIVE LEFT HIP WITH PELVIS COMPARISON:  03/07/2018 FINDINGS: Four low resolution intraoperative spot views of the left hip. Total fluoroscopy time was 18 seconds. Prior right hip replacement. Interval left hip replacement with normal alignment of the hardware. Pubic symphysis and rami are intact. IMPRESSION: Intraoperative fluoroscopic assistance provided during left hip replacement Electronically Signed   By: Donavan Foil M.D.   On:  03/09/2018 20:25   Dg Hip Operative Unilat W Or W/o Pelvis Left  Result Date: 03/09/2018 CLINICAL DATA:  Hip fracture EXAM: DG C-ARM 61-120 MIN; OPERATIVE LEFT HIP WITH PELVIS COMPARISON:  03/07/2018 FINDINGS: Four low resolution intraoperative spot views of the left hip. Total fluoroscopy time was 18 seconds. Prior right hip replacement. Interval left hip replacement with normal alignment of the hardware. Pubic symphysis and rami are intact. IMPRESSION: Intraoperative fluoroscopic assistance provided during left hip replacement Electronically Signed   By: Donavan Foil M.D.   On: 03/09/2018 20:25   Dg Hip Unilat W Or Wo Pelvis 2-3 Views Left  Result Date: 03/07/2018 CLINICAL DATA:  83 year old male with left hip pain. EXAM: DG HIP (WITH OR WITHOUT PELVIS) 2-3V LEFT COMPARISON:  Pelvic radiograph dated 05/15/2011 FINDINGS: There is a displaced fracture of the left femoral neck with superior migration of the femur and impaction on the lateral acetabular  roof. The left femoral head is poorly visualized due to advanced osteopenia but appears to be within the acetabulum. There is a total right hip arthroplasty which appears intact. IMPRESSION: 1. Displaced fracture of the left femoral neck. 2. Total right hip arthroplasty appears intact. Electronically Signed   By: Anner Crete M.D.   On: 03/07/2018 22:58    Assessment/Plan:   1. Closed displaced fracture of left femoral neck with routine healing -recovering nicely after therapy, f/u with Dr. Berenice Primas as planned -4 wks dvt prophylaxis with asa 325mg  po bid per ortho  2. Benign prostatic hyperplasia with incomplete bladder emptying -cont q 2 h voids, urinal use due to spraying concern he's having, f/u Dr. Louis Meckel as planned  3. Anemia due to blood loss - Lab Results  Component Value Date   HGB 11.2 (A) 03/18/2018  -f/u Jeremy  4. Coronary artery disease involving native heart without angina pectoris, unspecified vessel or lesion type -stable, cont current regimen, will need to return to regular anticoagulation regimen after his one month of asa 325mg  po bid ( from Dr. Berenice Primas for dvt prophylaxis)  5. Acute cystitis without hematuria -resolved with bactrim therapy   6. Other secondary osteoarthritis of first carpometacarpal joint of left hand -cont ice, tylenol, may need ortho eval for injection if persists, refused prednisone tx and not a good candidate for nsaids while on bid asa prophylaxis for his hip  Patient is being discharged with the following home health services:  PT  Patient is being discharged with the following durable medical equipment:  Using rollator walker  Patient has been advised to f/u with their PCP in 1-2 weeks to for a transitions of care visit.  Social services at their facility was responsible for arranging this appointment.  Pt was provided with adequate prescriptions of noncontrolled medications to reach the scheduled appointment .  For controlled  substances, a limited supply was provided as appropriate for the individual patient.  If the pt normally receives these medications from a pain clinic or has a contract with another physician, these medications should be received from that clinic or physician only).    More than 30 mins was spent on d/c plans  Future labs/tests needed:  F/u with Dr. Berenice Primas, Dr. Louis Meckel as planned, pt reports has CPE with Dr. Virgina Jock at the end of the month and plans to keep that appt  Ki Luckman L. Hollister Wessler, D.O. [facility] [address], [place] [address] Cell Phone (Mon-Fri 8am-5pm):  [phone] On Call:  [phone] & follow  prompts after 5pm & weekends Office Phone:  [phone] Office Fax:  [phone]

## 2018-04-03 DIAGNOSIS — S728X2S Other fracture of left femur, sequela: Secondary | ICD-10-CM | POA: Diagnosis not present

## 2018-04-03 DIAGNOSIS — Z471 Aftercare following joint replacement surgery: Secondary | ICD-10-CM | POA: Diagnosis not present

## 2018-04-03 DIAGNOSIS — M6389 Disorders of muscle in diseases classified elsewhere, multiple sites: Secondary | ICD-10-CM | POA: Diagnosis not present

## 2018-04-03 DIAGNOSIS — I119 Hypertensive heart disease without heart failure: Secondary | ICD-10-CM | POA: Diagnosis not present

## 2018-04-03 DIAGNOSIS — R2689 Other abnormalities of gait and mobility: Secondary | ICD-10-CM | POA: Diagnosis not present

## 2018-04-03 DIAGNOSIS — R278 Other lack of coordination: Secondary | ICD-10-CM | POA: Diagnosis not present

## 2018-04-03 DIAGNOSIS — M25552 Pain in left hip: Secondary | ICD-10-CM | POA: Diagnosis not present

## 2018-04-03 DIAGNOSIS — R600 Localized edema: Secondary | ICD-10-CM | POA: Diagnosis not present

## 2018-04-03 DIAGNOSIS — M62562 Muscle wasting and atrophy, not elsewhere classified, left lower leg: Secondary | ICD-10-CM | POA: Diagnosis not present

## 2018-04-06 DIAGNOSIS — M6389 Disorders of muscle in diseases classified elsewhere, multiple sites: Secondary | ICD-10-CM | POA: Diagnosis not present

## 2018-04-06 DIAGNOSIS — M62562 Muscle wasting and atrophy, not elsewhere classified, left lower leg: Secondary | ICD-10-CM | POA: Diagnosis not present

## 2018-04-06 DIAGNOSIS — R278 Other lack of coordination: Secondary | ICD-10-CM | POA: Diagnosis not present

## 2018-04-06 DIAGNOSIS — R2689 Other abnormalities of gait and mobility: Secondary | ICD-10-CM | POA: Diagnosis not present

## 2018-04-06 DIAGNOSIS — R600 Localized edema: Secondary | ICD-10-CM | POA: Diagnosis not present

## 2018-04-06 DIAGNOSIS — M25552 Pain in left hip: Secondary | ICD-10-CM | POA: Diagnosis not present

## 2018-04-09 DIAGNOSIS — M25552 Pain in left hip: Secondary | ICD-10-CM | POA: Diagnosis not present

## 2018-04-09 DIAGNOSIS — R600 Localized edema: Secondary | ICD-10-CM | POA: Diagnosis not present

## 2018-04-09 DIAGNOSIS — R278 Other lack of coordination: Secondary | ICD-10-CM | POA: Diagnosis not present

## 2018-04-09 DIAGNOSIS — M6389 Disorders of muscle in diseases classified elsewhere, multiple sites: Secondary | ICD-10-CM | POA: Diagnosis not present

## 2018-04-09 DIAGNOSIS — R2689 Other abnormalities of gait and mobility: Secondary | ICD-10-CM | POA: Diagnosis not present

## 2018-04-09 DIAGNOSIS — M62562 Muscle wasting and atrophy, not elsewhere classified, left lower leg: Secondary | ICD-10-CM | POA: Diagnosis not present

## 2018-04-10 DIAGNOSIS — R600 Localized edema: Secondary | ICD-10-CM | POA: Diagnosis not present

## 2018-04-10 DIAGNOSIS — M25552 Pain in left hip: Secondary | ICD-10-CM | POA: Diagnosis not present

## 2018-04-10 DIAGNOSIS — R278 Other lack of coordination: Secondary | ICD-10-CM | POA: Diagnosis not present

## 2018-04-10 DIAGNOSIS — M6389 Disorders of muscle in diseases classified elsewhere, multiple sites: Secondary | ICD-10-CM | POA: Diagnosis not present

## 2018-04-10 DIAGNOSIS — M62562 Muscle wasting and atrophy, not elsewhere classified, left lower leg: Secondary | ICD-10-CM | POA: Diagnosis not present

## 2018-04-10 DIAGNOSIS — R2689 Other abnormalities of gait and mobility: Secondary | ICD-10-CM | POA: Diagnosis not present

## 2018-04-13 DIAGNOSIS — M25552 Pain in left hip: Secondary | ICD-10-CM | POA: Diagnosis not present

## 2018-04-13 DIAGNOSIS — R600 Localized edema: Secondary | ICD-10-CM | POA: Diagnosis not present

## 2018-04-13 DIAGNOSIS — M6389 Disorders of muscle in diseases classified elsewhere, multiple sites: Secondary | ICD-10-CM | POA: Diagnosis not present

## 2018-04-13 DIAGNOSIS — M62562 Muscle wasting and atrophy, not elsewhere classified, left lower leg: Secondary | ICD-10-CM | POA: Diagnosis not present

## 2018-04-13 DIAGNOSIS — R2689 Other abnormalities of gait and mobility: Secondary | ICD-10-CM | POA: Diagnosis not present

## 2018-04-13 DIAGNOSIS — R278 Other lack of coordination: Secondary | ICD-10-CM | POA: Diagnosis not present

## 2018-04-14 DIAGNOSIS — M62562 Muscle wasting and atrophy, not elsewhere classified, left lower leg: Secondary | ICD-10-CM | POA: Diagnosis not present

## 2018-04-14 DIAGNOSIS — M25552 Pain in left hip: Secondary | ICD-10-CM | POA: Diagnosis not present

## 2018-04-14 DIAGNOSIS — R600 Localized edema: Secondary | ICD-10-CM | POA: Diagnosis not present

## 2018-04-14 DIAGNOSIS — M1712 Unilateral primary osteoarthritis, left knee: Secondary | ICD-10-CM | POA: Diagnosis not present

## 2018-04-14 DIAGNOSIS — M4722 Other spondylosis with radiculopathy, cervical region: Secondary | ICD-10-CM | POA: Diagnosis not present

## 2018-04-14 DIAGNOSIS — M6389 Disorders of muscle in diseases classified elsewhere, multiple sites: Secondary | ICD-10-CM | POA: Diagnosis not present

## 2018-04-14 DIAGNOSIS — M4692 Unspecified inflammatory spondylopathy, cervical region: Secondary | ICD-10-CM | POA: Diagnosis not present

## 2018-04-14 DIAGNOSIS — R2689 Other abnormalities of gait and mobility: Secondary | ICD-10-CM | POA: Diagnosis not present

## 2018-04-14 DIAGNOSIS — R278 Other lack of coordination: Secondary | ICD-10-CM | POA: Diagnosis not present

## 2018-04-14 DIAGNOSIS — M1711 Unilateral primary osteoarthritis, right knee: Secondary | ICD-10-CM | POA: Diagnosis not present

## 2018-04-16 DIAGNOSIS — M6389 Disorders of muscle in diseases classified elsewhere, multiple sites: Secondary | ICD-10-CM | POA: Diagnosis not present

## 2018-04-16 DIAGNOSIS — M62562 Muscle wasting and atrophy, not elsewhere classified, left lower leg: Secondary | ICD-10-CM | POA: Diagnosis not present

## 2018-04-16 DIAGNOSIS — M25552 Pain in left hip: Secondary | ICD-10-CM | POA: Diagnosis not present

## 2018-04-16 DIAGNOSIS — R2689 Other abnormalities of gait and mobility: Secondary | ICD-10-CM | POA: Diagnosis not present

## 2018-04-16 DIAGNOSIS — R600 Localized edema: Secondary | ICD-10-CM | POA: Diagnosis not present

## 2018-04-16 DIAGNOSIS — R278 Other lack of coordination: Secondary | ICD-10-CM | POA: Diagnosis not present

## 2018-04-20 DIAGNOSIS — M62562 Muscle wasting and atrophy, not elsewhere classified, left lower leg: Secondary | ICD-10-CM | POA: Diagnosis not present

## 2018-04-20 DIAGNOSIS — R600 Localized edema: Secondary | ICD-10-CM | POA: Diagnosis not present

## 2018-04-20 DIAGNOSIS — M25552 Pain in left hip: Secondary | ICD-10-CM | POA: Diagnosis not present

## 2018-04-20 DIAGNOSIS — R278 Other lack of coordination: Secondary | ICD-10-CM | POA: Diagnosis not present

## 2018-04-20 DIAGNOSIS — R2689 Other abnormalities of gait and mobility: Secondary | ICD-10-CM | POA: Diagnosis not present

## 2018-04-20 DIAGNOSIS — M6389 Disorders of muscle in diseases classified elsewhere, multiple sites: Secondary | ICD-10-CM | POA: Diagnosis not present

## 2018-04-21 DIAGNOSIS — M6389 Disorders of muscle in diseases classified elsewhere, multiple sites: Secondary | ICD-10-CM | POA: Diagnosis not present

## 2018-04-21 DIAGNOSIS — M62562 Muscle wasting and atrophy, not elsewhere classified, left lower leg: Secondary | ICD-10-CM | POA: Diagnosis not present

## 2018-04-21 DIAGNOSIS — R2689 Other abnormalities of gait and mobility: Secondary | ICD-10-CM | POA: Diagnosis not present

## 2018-04-21 DIAGNOSIS — R600 Localized edema: Secondary | ICD-10-CM | POA: Diagnosis not present

## 2018-04-21 DIAGNOSIS — M25552 Pain in left hip: Secondary | ICD-10-CM | POA: Diagnosis not present

## 2018-04-21 DIAGNOSIS — R278 Other lack of coordination: Secondary | ICD-10-CM | POA: Diagnosis not present

## 2018-04-23 DIAGNOSIS — R82998 Other abnormal findings in urine: Secondary | ICD-10-CM | POA: Diagnosis not present

## 2018-04-23 DIAGNOSIS — M25552 Pain in left hip: Secondary | ICD-10-CM | POA: Diagnosis not present

## 2018-04-23 DIAGNOSIS — R2689 Other abnormalities of gait and mobility: Secondary | ICD-10-CM | POA: Diagnosis not present

## 2018-04-23 DIAGNOSIS — R278 Other lack of coordination: Secondary | ICD-10-CM | POA: Diagnosis not present

## 2018-04-23 DIAGNOSIS — Z125 Encounter for screening for malignant neoplasm of prostate: Secondary | ICD-10-CM | POA: Diagnosis not present

## 2018-04-23 DIAGNOSIS — R600 Localized edema: Secondary | ICD-10-CM | POA: Diagnosis not present

## 2018-04-23 DIAGNOSIS — E7849 Other hyperlipidemia: Secondary | ICD-10-CM | POA: Diagnosis not present

## 2018-04-23 DIAGNOSIS — M6389 Disorders of muscle in diseases classified elsewhere, multiple sites: Secondary | ICD-10-CM | POA: Diagnosis not present

## 2018-04-23 DIAGNOSIS — M62562 Muscle wasting and atrophy, not elsewhere classified, left lower leg: Secondary | ICD-10-CM | POA: Diagnosis not present

## 2018-04-23 DIAGNOSIS — I10 Essential (primary) hypertension: Secondary | ICD-10-CM | POA: Diagnosis not present

## 2018-04-27 DIAGNOSIS — R278 Other lack of coordination: Secondary | ICD-10-CM | POA: Diagnosis not present

## 2018-04-27 DIAGNOSIS — M6389 Disorders of muscle in diseases classified elsewhere, multiple sites: Secondary | ICD-10-CM | POA: Diagnosis not present

## 2018-04-27 DIAGNOSIS — M25552 Pain in left hip: Secondary | ICD-10-CM | POA: Diagnosis not present

## 2018-04-27 DIAGNOSIS — R600 Localized edema: Secondary | ICD-10-CM | POA: Diagnosis not present

## 2018-04-27 DIAGNOSIS — M62562 Muscle wasting and atrophy, not elsewhere classified, left lower leg: Secondary | ICD-10-CM | POA: Diagnosis not present

## 2018-04-27 DIAGNOSIS — R2689 Other abnormalities of gait and mobility: Secondary | ICD-10-CM | POA: Diagnosis not present

## 2018-04-28 DIAGNOSIS — M25552 Pain in left hip: Secondary | ICD-10-CM | POA: Diagnosis not present

## 2018-04-28 DIAGNOSIS — M62562 Muscle wasting and atrophy, not elsewhere classified, left lower leg: Secondary | ICD-10-CM | POA: Diagnosis not present

## 2018-04-28 DIAGNOSIS — R278 Other lack of coordination: Secondary | ICD-10-CM | POA: Diagnosis not present

## 2018-04-28 DIAGNOSIS — R2689 Other abnormalities of gait and mobility: Secondary | ICD-10-CM | POA: Diagnosis not present

## 2018-04-28 DIAGNOSIS — M6389 Disorders of muscle in diseases classified elsewhere, multiple sites: Secondary | ICD-10-CM | POA: Diagnosis not present

## 2018-04-28 DIAGNOSIS — R600 Localized edema: Secondary | ICD-10-CM | POA: Diagnosis not present

## 2018-04-30 DIAGNOSIS — M62562 Muscle wasting and atrophy, not elsewhere classified, left lower leg: Secondary | ICD-10-CM | POA: Diagnosis not present

## 2018-04-30 DIAGNOSIS — R946 Abnormal results of thyroid function studies: Secondary | ICD-10-CM | POA: Diagnosis not present

## 2018-04-30 DIAGNOSIS — Z0000 Encounter for general adult medical examination without abnormal findings: Secondary | ICD-10-CM | POA: Diagnosis not present

## 2018-04-30 DIAGNOSIS — M6389 Disorders of muscle in diseases classified elsewhere, multiple sites: Secondary | ICD-10-CM | POA: Diagnosis not present

## 2018-04-30 DIAGNOSIS — R2689 Other abnormalities of gait and mobility: Secondary | ICD-10-CM | POA: Diagnosis not present

## 2018-04-30 DIAGNOSIS — R600 Localized edema: Secondary | ICD-10-CM | POA: Diagnosis not present

## 2018-04-30 DIAGNOSIS — Z1331 Encounter for screening for depression: Secondary | ICD-10-CM | POA: Diagnosis not present

## 2018-04-30 DIAGNOSIS — I2510 Atherosclerotic heart disease of native coronary artery without angina pectoris: Secondary | ICD-10-CM | POA: Diagnosis not present

## 2018-04-30 DIAGNOSIS — Z6827 Body mass index (BMI) 27.0-27.9, adult: Secondary | ICD-10-CM | POA: Diagnosis not present

## 2018-04-30 DIAGNOSIS — I119 Hypertensive heart disease without heart failure: Secondary | ICD-10-CM | POA: Diagnosis not present

## 2018-04-30 DIAGNOSIS — E663 Overweight: Secondary | ICD-10-CM | POA: Diagnosis not present

## 2018-04-30 DIAGNOSIS — D692 Other nonthrombocytopenic purpura: Secondary | ICD-10-CM | POA: Diagnosis not present

## 2018-04-30 DIAGNOSIS — D696 Thrombocytopenia, unspecified: Secondary | ICD-10-CM | POA: Diagnosis not present

## 2018-04-30 DIAGNOSIS — Z1339 Encounter for screening examination for other mental health and behavioral disorders: Secondary | ICD-10-CM | POA: Diagnosis not present

## 2018-04-30 DIAGNOSIS — M25552 Pain in left hip: Secondary | ICD-10-CM | POA: Diagnosis not present

## 2018-04-30 DIAGNOSIS — G2581 Restless legs syndrome: Secondary | ICD-10-CM | POA: Diagnosis not present

## 2018-04-30 DIAGNOSIS — G4733 Obstructive sleep apnea (adult) (pediatric): Secondary | ICD-10-CM | POA: Diagnosis not present

## 2018-04-30 DIAGNOSIS — M1990 Unspecified osteoarthritis, unspecified site: Secondary | ICD-10-CM | POA: Diagnosis not present

## 2018-04-30 DIAGNOSIS — I878 Other specified disorders of veins: Secondary | ICD-10-CM | POA: Diagnosis not present

## 2018-04-30 DIAGNOSIS — R278 Other lack of coordination: Secondary | ICD-10-CM | POA: Diagnosis not present

## 2018-05-04 DIAGNOSIS — R350 Frequency of micturition: Secondary | ICD-10-CM | POA: Diagnosis not present

## 2018-05-04 DIAGNOSIS — N401 Enlarged prostate with lower urinary tract symptoms: Secondary | ICD-10-CM | POA: Diagnosis not present

## 2018-05-04 DIAGNOSIS — Z1212 Encounter for screening for malignant neoplasm of rectum: Secondary | ICD-10-CM | POA: Diagnosis not present

## 2018-05-07 DIAGNOSIS — M6389 Disorders of muscle in diseases classified elsewhere, multiple sites: Secondary | ICD-10-CM | POA: Diagnosis not present

## 2018-05-07 DIAGNOSIS — R278 Other lack of coordination: Secondary | ICD-10-CM | POA: Diagnosis not present

## 2018-05-07 DIAGNOSIS — R600 Localized edema: Secondary | ICD-10-CM | POA: Diagnosis not present

## 2018-05-07 DIAGNOSIS — I119 Hypertensive heart disease without heart failure: Secondary | ICD-10-CM | POA: Diagnosis not present

## 2018-05-07 DIAGNOSIS — S728X2S Other fracture of left femur, sequela: Secondary | ICD-10-CM | POA: Diagnosis not present

## 2018-05-07 DIAGNOSIS — Z471 Aftercare following joint replacement surgery: Secondary | ICD-10-CM | POA: Diagnosis not present

## 2018-05-11 DIAGNOSIS — Z471 Aftercare following joint replacement surgery: Secondary | ICD-10-CM | POA: Diagnosis not present

## 2018-05-11 DIAGNOSIS — R278 Other lack of coordination: Secondary | ICD-10-CM | POA: Diagnosis not present

## 2018-05-11 DIAGNOSIS — I119 Hypertensive heart disease without heart failure: Secondary | ICD-10-CM | POA: Diagnosis not present

## 2018-05-11 DIAGNOSIS — M6389 Disorders of muscle in diseases classified elsewhere, multiple sites: Secondary | ICD-10-CM | POA: Diagnosis not present

## 2018-05-11 DIAGNOSIS — S728X2S Other fracture of left femur, sequela: Secondary | ICD-10-CM | POA: Diagnosis not present

## 2018-05-11 DIAGNOSIS — R600 Localized edema: Secondary | ICD-10-CM | POA: Diagnosis not present

## 2018-05-12 DIAGNOSIS — R600 Localized edema: Secondary | ICD-10-CM | POA: Diagnosis not present

## 2018-05-12 DIAGNOSIS — I119 Hypertensive heart disease without heart failure: Secondary | ICD-10-CM | POA: Diagnosis not present

## 2018-05-12 DIAGNOSIS — S728X2S Other fracture of left femur, sequela: Secondary | ICD-10-CM | POA: Diagnosis not present

## 2018-05-12 DIAGNOSIS — R278 Other lack of coordination: Secondary | ICD-10-CM | POA: Diagnosis not present

## 2018-05-12 DIAGNOSIS — M6389 Disorders of muscle in diseases classified elsewhere, multiple sites: Secondary | ICD-10-CM | POA: Diagnosis not present

## 2018-05-12 DIAGNOSIS — Z471 Aftercare following joint replacement surgery: Secondary | ICD-10-CM | POA: Diagnosis not present

## 2018-05-14 DIAGNOSIS — I119 Hypertensive heart disease without heart failure: Secondary | ICD-10-CM | POA: Diagnosis not present

## 2018-05-14 DIAGNOSIS — M6389 Disorders of muscle in diseases classified elsewhere, multiple sites: Secondary | ICD-10-CM | POA: Diagnosis not present

## 2018-05-14 DIAGNOSIS — Z471 Aftercare following joint replacement surgery: Secondary | ICD-10-CM | POA: Diagnosis not present

## 2018-05-14 DIAGNOSIS — R278 Other lack of coordination: Secondary | ICD-10-CM | POA: Diagnosis not present

## 2018-05-14 DIAGNOSIS — S728X2S Other fracture of left femur, sequela: Secondary | ICD-10-CM | POA: Diagnosis not present

## 2018-05-14 DIAGNOSIS — R600 Localized edema: Secondary | ICD-10-CM | POA: Diagnosis not present

## 2018-05-18 DIAGNOSIS — R600 Localized edema: Secondary | ICD-10-CM | POA: Diagnosis not present

## 2018-05-18 DIAGNOSIS — R278 Other lack of coordination: Secondary | ICD-10-CM | POA: Diagnosis not present

## 2018-05-18 DIAGNOSIS — S728X2S Other fracture of left femur, sequela: Secondary | ICD-10-CM | POA: Diagnosis not present

## 2018-05-18 DIAGNOSIS — I119 Hypertensive heart disease without heart failure: Secondary | ICD-10-CM | POA: Diagnosis not present

## 2018-05-18 DIAGNOSIS — Z471 Aftercare following joint replacement surgery: Secondary | ICD-10-CM | POA: Diagnosis not present

## 2018-05-18 DIAGNOSIS — M6389 Disorders of muscle in diseases classified elsewhere, multiple sites: Secondary | ICD-10-CM | POA: Diagnosis not present

## 2018-05-19 DIAGNOSIS — I119 Hypertensive heart disease without heart failure: Secondary | ICD-10-CM | POA: Diagnosis not present

## 2018-05-19 DIAGNOSIS — Z471 Aftercare following joint replacement surgery: Secondary | ICD-10-CM | POA: Diagnosis not present

## 2018-05-19 DIAGNOSIS — R278 Other lack of coordination: Secondary | ICD-10-CM | POA: Diagnosis not present

## 2018-05-19 DIAGNOSIS — R600 Localized edema: Secondary | ICD-10-CM | POA: Diagnosis not present

## 2018-05-19 DIAGNOSIS — M6389 Disorders of muscle in diseases classified elsewhere, multiple sites: Secondary | ICD-10-CM | POA: Diagnosis not present

## 2018-05-19 DIAGNOSIS — S728X2S Other fracture of left femur, sequela: Secondary | ICD-10-CM | POA: Diagnosis not present

## 2018-05-21 DIAGNOSIS — I119 Hypertensive heart disease without heart failure: Secondary | ICD-10-CM | POA: Diagnosis not present

## 2018-05-21 DIAGNOSIS — S728X2S Other fracture of left femur, sequela: Secondary | ICD-10-CM | POA: Diagnosis not present

## 2018-05-21 DIAGNOSIS — M6389 Disorders of muscle in diseases classified elsewhere, multiple sites: Secondary | ICD-10-CM | POA: Diagnosis not present

## 2018-05-21 DIAGNOSIS — R278 Other lack of coordination: Secondary | ICD-10-CM | POA: Diagnosis not present

## 2018-05-21 DIAGNOSIS — R600 Localized edema: Secondary | ICD-10-CM | POA: Diagnosis not present

## 2018-05-21 DIAGNOSIS — Z471 Aftercare following joint replacement surgery: Secondary | ICD-10-CM | POA: Diagnosis not present

## 2018-05-26 ENCOUNTER — Telehealth: Payer: Self-pay

## 2018-05-26 ENCOUNTER — Other Ambulatory Visit: Payer: Self-pay | Admitting: Orthopedic Surgery

## 2018-05-26 NOTE — Telephone Encounter (Signed)
Spoke with Colletta Maryland from requesting office. She states that she will put a referral in

## 2018-05-26 NOTE — Telephone Encounter (Signed)
   [facility] Pre-operative Risk Assessment    Request for surgical clearance:  1. What type of surgery is being performed? LEFT TOTAL KNEE ARTHROPLASTY   2. When is this surgery scheduled? 07-13-2018   3. What type of clearance is required (medical clearance vs. Pharmacy clearance to hold med vs. Both)?  MEDICAL  4. Are there any medications that need to be held prior to surgery and how long? NON-PT TAKES ASA?   5. Practice name and name of physician performing surgery? [facility],MD   6. What is your office phone number [phone]    7.   What is your office fax number [phone]  8.   Anesthesia type (None, local, MAC, general) ?  SPINAL   Waylan Rocher 05/26/2018, 12:36 PM  _________________________________________________________________   (provider comments below)

## 2018-05-26 NOTE — Telephone Encounter (Signed)
   Primary Cardiologist:No primary care provider on file. Last seen in 2013 by Dr. Martin[place]  Chart reviewed as part of pre-operative protocol coverage. Because of Jeremy Sawyer's past medical history and time since last visit, he/she will require a follow-up visit in order to better assess preoperative cardiovascular risk.  Pre-op covering staff: - Since pt has not been seen in our office since 2013 and not followed by [place], please contact requesting office for them to place a referral so that we can make a new pt appointment for clearance.    Daune Perch, NP  05/26/2018, 1:30 PM

## 2018-05-27 NOTE — Telephone Encounter (Signed)
SPOKE WITH PT WIFE WHO STATED SHE TALKED TO NURSE STEPHANIE OF SURGEON  YESTERDAY EVENING AND TOLD THEM THERE WAS NO NEED FOR A CARDIAC CLEARANCE APPOINTMENT.

## 2018-06-04 DIAGNOSIS — Z471 Aftercare following joint replacement surgery: Secondary | ICD-10-CM | POA: Diagnosis not present

## 2018-06-04 DIAGNOSIS — M6389 Disorders of muscle in diseases classified elsewhere, multiple sites: Secondary | ICD-10-CM | POA: Diagnosis not present

## 2018-06-04 DIAGNOSIS — I119 Hypertensive heart disease without heart failure: Secondary | ICD-10-CM | POA: Diagnosis not present

## 2018-06-04 DIAGNOSIS — S728X2S Other fracture of left femur, sequela: Secondary | ICD-10-CM | POA: Diagnosis not present

## 2018-06-04 DIAGNOSIS — R600 Localized edema: Secondary | ICD-10-CM | POA: Diagnosis not present

## 2018-06-04 DIAGNOSIS — R278 Other lack of coordination: Secondary | ICD-10-CM | POA: Diagnosis not present

## 2018-06-08 DIAGNOSIS — S728X2S Other fracture of left femur, sequela: Secondary | ICD-10-CM | POA: Diagnosis not present

## 2018-06-08 DIAGNOSIS — I119 Hypertensive heart disease without heart failure: Secondary | ICD-10-CM | POA: Diagnosis not present

## 2018-06-08 DIAGNOSIS — R600 Localized edema: Secondary | ICD-10-CM | POA: Diagnosis not present

## 2018-06-08 DIAGNOSIS — R278 Other lack of coordination: Secondary | ICD-10-CM | POA: Diagnosis not present

## 2018-06-08 DIAGNOSIS — M6389 Disorders of muscle in diseases classified elsewhere, multiple sites: Secondary | ICD-10-CM | POA: Diagnosis not present

## 2018-06-08 DIAGNOSIS — Z471 Aftercare following joint replacement surgery: Secondary | ICD-10-CM | POA: Diagnosis not present

## 2018-06-09 DIAGNOSIS — R278 Other lack of coordination: Secondary | ICD-10-CM | POA: Diagnosis not present

## 2018-06-09 DIAGNOSIS — S728X2S Other fracture of left femur, sequela: Secondary | ICD-10-CM | POA: Diagnosis not present

## 2018-06-09 DIAGNOSIS — I119 Hypertensive heart disease without heart failure: Secondary | ICD-10-CM | POA: Diagnosis not present

## 2018-06-09 DIAGNOSIS — Z471 Aftercare following joint replacement surgery: Secondary | ICD-10-CM | POA: Diagnosis not present

## 2018-06-09 DIAGNOSIS — R600 Localized edema: Secondary | ICD-10-CM | POA: Diagnosis not present

## 2018-06-09 DIAGNOSIS — M6389 Disorders of muscle in diseases classified elsewhere, multiple sites: Secondary | ICD-10-CM | POA: Diagnosis not present

## 2018-06-11 DIAGNOSIS — I119 Hypertensive heart disease without heart failure: Secondary | ICD-10-CM | POA: Diagnosis not present

## 2018-06-11 DIAGNOSIS — S728X2S Other fracture of left femur, sequela: Secondary | ICD-10-CM | POA: Diagnosis not present

## 2018-06-11 DIAGNOSIS — R278 Other lack of coordination: Secondary | ICD-10-CM | POA: Diagnosis not present

## 2018-06-11 DIAGNOSIS — M6389 Disorders of muscle in diseases classified elsewhere, multiple sites: Secondary | ICD-10-CM | POA: Diagnosis not present

## 2018-06-11 DIAGNOSIS — R600 Localized edema: Secondary | ICD-10-CM | POA: Diagnosis not present

## 2018-06-11 DIAGNOSIS — Z471 Aftercare following joint replacement surgery: Secondary | ICD-10-CM | POA: Diagnosis not present

## 2018-06-15 DIAGNOSIS — I119 Hypertensive heart disease without heart failure: Secondary | ICD-10-CM | POA: Diagnosis not present

## 2018-06-15 DIAGNOSIS — Z471 Aftercare following joint replacement surgery: Secondary | ICD-10-CM | POA: Diagnosis not present

## 2018-06-15 DIAGNOSIS — M6389 Disorders of muscle in diseases classified elsewhere, multiple sites: Secondary | ICD-10-CM | POA: Diagnosis not present

## 2018-06-15 DIAGNOSIS — R278 Other lack of coordination: Secondary | ICD-10-CM | POA: Diagnosis not present

## 2018-06-15 DIAGNOSIS — S728X2S Other fracture of left femur, sequela: Secondary | ICD-10-CM | POA: Diagnosis not present

## 2018-06-15 DIAGNOSIS — R600 Localized edema: Secondary | ICD-10-CM | POA: Diagnosis not present

## 2018-06-16 DIAGNOSIS — R600 Localized edema: Secondary | ICD-10-CM | POA: Diagnosis not present

## 2018-06-16 DIAGNOSIS — M6389 Disorders of muscle in diseases classified elsewhere, multiple sites: Secondary | ICD-10-CM | POA: Diagnosis not present

## 2018-06-16 DIAGNOSIS — R278 Other lack of coordination: Secondary | ICD-10-CM | POA: Diagnosis not present

## 2018-06-16 DIAGNOSIS — Z471 Aftercare following joint replacement surgery: Secondary | ICD-10-CM | POA: Diagnosis not present

## 2018-06-16 DIAGNOSIS — S728X2S Other fracture of left femur, sequela: Secondary | ICD-10-CM | POA: Diagnosis not present

## 2018-06-16 DIAGNOSIS — I119 Hypertensive heart disease without heart failure: Secondary | ICD-10-CM | POA: Diagnosis not present

## 2018-06-18 DIAGNOSIS — R600 Localized edema: Secondary | ICD-10-CM | POA: Diagnosis not present

## 2018-06-18 DIAGNOSIS — R278 Other lack of coordination: Secondary | ICD-10-CM | POA: Diagnosis not present

## 2018-06-18 DIAGNOSIS — M6389 Disorders of muscle in diseases classified elsewhere, multiple sites: Secondary | ICD-10-CM | POA: Diagnosis not present

## 2018-06-18 DIAGNOSIS — I119 Hypertensive heart disease without heart failure: Secondary | ICD-10-CM | POA: Diagnosis not present

## 2018-06-18 DIAGNOSIS — Z471 Aftercare following joint replacement surgery: Secondary | ICD-10-CM | POA: Diagnosis not present

## 2018-06-18 DIAGNOSIS — S728X2S Other fracture of left femur, sequela: Secondary | ICD-10-CM | POA: Diagnosis not present

## 2018-06-22 DIAGNOSIS — Z471 Aftercare following joint replacement surgery: Secondary | ICD-10-CM | POA: Diagnosis not present

## 2018-06-22 DIAGNOSIS — S728X2S Other fracture of left femur, sequela: Secondary | ICD-10-CM | POA: Diagnosis not present

## 2018-06-22 DIAGNOSIS — M6389 Disorders of muscle in diseases classified elsewhere, multiple sites: Secondary | ICD-10-CM | POA: Diagnosis not present

## 2018-06-22 DIAGNOSIS — R600 Localized edema: Secondary | ICD-10-CM | POA: Diagnosis not present

## 2018-06-22 DIAGNOSIS — R278 Other lack of coordination: Secondary | ICD-10-CM | POA: Diagnosis not present

## 2018-06-22 DIAGNOSIS — I119 Hypertensive heart disease without heart failure: Secondary | ICD-10-CM | POA: Diagnosis not present

## 2018-06-23 DIAGNOSIS — I119 Hypertensive heart disease without heart failure: Secondary | ICD-10-CM | POA: Diagnosis not present

## 2018-06-23 DIAGNOSIS — Z471 Aftercare following joint replacement surgery: Secondary | ICD-10-CM | POA: Diagnosis not present

## 2018-06-23 DIAGNOSIS — M6389 Disorders of muscle in diseases classified elsewhere, multiple sites: Secondary | ICD-10-CM | POA: Diagnosis not present

## 2018-06-23 DIAGNOSIS — R278 Other lack of coordination: Secondary | ICD-10-CM | POA: Diagnosis not present

## 2018-06-23 DIAGNOSIS — S728X2S Other fracture of left femur, sequela: Secondary | ICD-10-CM | POA: Diagnosis not present

## 2018-06-23 DIAGNOSIS — R600 Localized edema: Secondary | ICD-10-CM | POA: Diagnosis not present

## 2018-06-25 DIAGNOSIS — I119 Hypertensive heart disease without heart failure: Secondary | ICD-10-CM | POA: Diagnosis not present

## 2018-06-25 DIAGNOSIS — R600 Localized edema: Secondary | ICD-10-CM | POA: Diagnosis not present

## 2018-06-25 DIAGNOSIS — M6389 Disorders of muscle in diseases classified elsewhere, multiple sites: Secondary | ICD-10-CM | POA: Diagnosis not present

## 2018-06-25 DIAGNOSIS — Z471 Aftercare following joint replacement surgery: Secondary | ICD-10-CM | POA: Diagnosis not present

## 2018-06-25 DIAGNOSIS — R278 Other lack of coordination: Secondary | ICD-10-CM | POA: Diagnosis not present

## 2018-06-25 DIAGNOSIS — S728X2S Other fracture of left femur, sequela: Secondary | ICD-10-CM | POA: Diagnosis not present

## 2018-06-29 DIAGNOSIS — I119 Hypertensive heart disease without heart failure: Secondary | ICD-10-CM | POA: Diagnosis not present

## 2018-06-29 DIAGNOSIS — R600 Localized edema: Secondary | ICD-10-CM | POA: Diagnosis not present

## 2018-06-29 DIAGNOSIS — R278 Other lack of coordination: Secondary | ICD-10-CM | POA: Diagnosis not present

## 2018-06-29 DIAGNOSIS — M6389 Disorders of muscle in diseases classified elsewhere, multiple sites: Secondary | ICD-10-CM | POA: Diagnosis not present

## 2018-06-29 DIAGNOSIS — Z471 Aftercare following joint replacement surgery: Secondary | ICD-10-CM | POA: Diagnosis not present

## 2018-06-29 DIAGNOSIS — S728X2S Other fracture of left femur, sequela: Secondary | ICD-10-CM | POA: Diagnosis not present

## 2018-07-02 DIAGNOSIS — M6389 Disorders of muscle in diseases classified elsewhere, multiple sites: Secondary | ICD-10-CM | POA: Diagnosis not present

## 2018-07-02 DIAGNOSIS — S728X2S Other fracture of left femur, sequela: Secondary | ICD-10-CM | POA: Diagnosis not present

## 2018-07-02 DIAGNOSIS — Z471 Aftercare following joint replacement surgery: Secondary | ICD-10-CM | POA: Diagnosis not present

## 2018-07-02 DIAGNOSIS — I119 Hypertensive heart disease without heart failure: Secondary | ICD-10-CM | POA: Diagnosis not present

## 2018-07-02 DIAGNOSIS — R600 Localized edema: Secondary | ICD-10-CM | POA: Diagnosis not present

## 2018-07-02 DIAGNOSIS — R278 Other lack of coordination: Secondary | ICD-10-CM | POA: Diagnosis not present

## 2018-07-06 DIAGNOSIS — R600 Localized edema: Secondary | ICD-10-CM | POA: Diagnosis not present

## 2018-07-06 DIAGNOSIS — I119 Hypertensive heart disease without heart failure: Secondary | ICD-10-CM | POA: Diagnosis not present

## 2018-07-06 DIAGNOSIS — S728X2S Other fracture of left femur, sequela: Secondary | ICD-10-CM | POA: Diagnosis not present

## 2018-07-06 DIAGNOSIS — R278 Other lack of coordination: Secondary | ICD-10-CM | POA: Diagnosis not present

## 2018-07-06 DIAGNOSIS — M6389 Disorders of muscle in diseases classified elsewhere, multiple sites: Secondary | ICD-10-CM | POA: Diagnosis not present

## 2018-07-06 DIAGNOSIS — Z471 Aftercare following joint replacement surgery: Secondary | ICD-10-CM | POA: Diagnosis not present

## 2018-07-09 DIAGNOSIS — S728X2S Other fracture of left femur, sequela: Secondary | ICD-10-CM | POA: Diagnosis not present

## 2018-07-09 DIAGNOSIS — I119 Hypertensive heart disease without heart failure: Secondary | ICD-10-CM | POA: Diagnosis not present

## 2018-07-09 DIAGNOSIS — Z471 Aftercare following joint replacement surgery: Secondary | ICD-10-CM | POA: Diagnosis not present

## 2018-07-09 DIAGNOSIS — R600 Localized edema: Secondary | ICD-10-CM | POA: Diagnosis not present

## 2018-07-09 DIAGNOSIS — M6389 Disorders of muscle in diseases classified elsewhere, multiple sites: Secondary | ICD-10-CM | POA: Diagnosis not present

## 2018-07-09 DIAGNOSIS — R278 Other lack of coordination: Secondary | ICD-10-CM | POA: Diagnosis not present

## 2018-07-13 ENCOUNTER — Ambulatory Visit: Admit: 2018-07-13 | Payer: PRIVATE HEALTH INSURANCE | Admitting: Orthopedic Surgery

## 2018-07-13 DIAGNOSIS — Z471 Aftercare following joint replacement surgery: Secondary | ICD-10-CM | POA: Diagnosis not present

## 2018-07-13 DIAGNOSIS — S728X2S Other fracture of left femur, sequela: Secondary | ICD-10-CM | POA: Diagnosis not present

## 2018-07-13 DIAGNOSIS — R278 Other lack of coordination: Secondary | ICD-10-CM | POA: Diagnosis not present

## 2018-07-13 DIAGNOSIS — R600 Localized edema: Secondary | ICD-10-CM | POA: Diagnosis not present

## 2018-07-13 DIAGNOSIS — M6389 Disorders of muscle in diseases classified elsewhere, multiple sites: Secondary | ICD-10-CM | POA: Diagnosis not present

## 2018-07-13 DIAGNOSIS — I119 Hypertensive heart disease without heart failure: Secondary | ICD-10-CM | POA: Diagnosis not present

## 2018-07-13 SURGERY — ARTHROPLASTY, KNEE, TOTAL
Anesthesia: Spinal | Laterality: Left

## 2018-07-16 DIAGNOSIS — M6389 Disorders of muscle in diseases classified elsewhere, multiple sites: Secondary | ICD-10-CM | POA: Diagnosis not present

## 2018-07-16 DIAGNOSIS — Z471 Aftercare following joint replacement surgery: Secondary | ICD-10-CM | POA: Diagnosis not present

## 2018-07-16 DIAGNOSIS — I119 Hypertensive heart disease without heart failure: Secondary | ICD-10-CM | POA: Diagnosis not present

## 2018-07-16 DIAGNOSIS — R278 Other lack of coordination: Secondary | ICD-10-CM | POA: Diagnosis not present

## 2018-07-16 DIAGNOSIS — S728X2S Other fracture of left femur, sequela: Secondary | ICD-10-CM | POA: Diagnosis not present

## 2018-07-16 DIAGNOSIS — R600 Localized edema: Secondary | ICD-10-CM | POA: Diagnosis not present

## 2018-07-20 DIAGNOSIS — M6389 Disorders of muscle in diseases classified elsewhere, multiple sites: Secondary | ICD-10-CM | POA: Diagnosis not present

## 2018-07-20 DIAGNOSIS — R278 Other lack of coordination: Secondary | ICD-10-CM | POA: Diagnosis not present

## 2018-07-20 DIAGNOSIS — Z471 Aftercare following joint replacement surgery: Secondary | ICD-10-CM | POA: Diagnosis not present

## 2018-07-20 DIAGNOSIS — R600 Localized edema: Secondary | ICD-10-CM | POA: Diagnosis not present

## 2018-07-20 DIAGNOSIS — I119 Hypertensive heart disease without heart failure: Secondary | ICD-10-CM | POA: Diagnosis not present

## 2018-07-20 DIAGNOSIS — S728X2S Other fracture of left femur, sequela: Secondary | ICD-10-CM | POA: Diagnosis not present

## 2018-07-23 DIAGNOSIS — I119 Hypertensive heart disease without heart failure: Secondary | ICD-10-CM | POA: Diagnosis not present

## 2018-07-23 DIAGNOSIS — R600 Localized edema: Secondary | ICD-10-CM | POA: Diagnosis not present

## 2018-07-23 DIAGNOSIS — Z471 Aftercare following joint replacement surgery: Secondary | ICD-10-CM | POA: Diagnosis not present

## 2018-07-23 DIAGNOSIS — S728X2S Other fracture of left femur, sequela: Secondary | ICD-10-CM | POA: Diagnosis not present

## 2018-07-23 DIAGNOSIS — M6389 Disorders of muscle in diseases classified elsewhere, multiple sites: Secondary | ICD-10-CM | POA: Diagnosis not present

## 2018-07-23 DIAGNOSIS — R278 Other lack of coordination: Secondary | ICD-10-CM | POA: Diagnosis not present

## 2018-09-04 DIAGNOSIS — N401 Enlarged prostate with lower urinary tract symptoms: Secondary | ICD-10-CM | POA: Diagnosis not present

## 2018-09-04 DIAGNOSIS — R350 Frequency of micturition: Secondary | ICD-10-CM | POA: Diagnosis not present

## 2018-11-05 DIAGNOSIS — I119 Hypertensive heart disease without heart failure: Secondary | ICD-10-CM | POA: Diagnosis not present

## 2018-11-05 DIAGNOSIS — I2510 Atherosclerotic heart disease of native coronary artery without angina pectoris: Secondary | ICD-10-CM | POA: Diagnosis not present

## 2018-11-05 DIAGNOSIS — G4733 Obstructive sleep apnea (adult) (pediatric): Secondary | ICD-10-CM | POA: Diagnosis not present

## 2018-11-05 DIAGNOSIS — E785 Hyperlipidemia, unspecified: Secondary | ICD-10-CM | POA: Diagnosis not present

## 2018-11-05 DIAGNOSIS — E663 Overweight: Secondary | ICD-10-CM | POA: Diagnosis not present

## 2018-11-05 DIAGNOSIS — D696 Thrombocytopenia, unspecified: Secondary | ICD-10-CM | POA: Diagnosis not present

## 2018-11-05 DIAGNOSIS — R946 Abnormal results of thyroid function studies: Secondary | ICD-10-CM | POA: Diagnosis not present

## 2018-11-05 DIAGNOSIS — R350 Frequency of micturition: Secondary | ICD-10-CM | POA: Diagnosis not present

## 2018-11-05 DIAGNOSIS — D692 Other nonthrombocytopenic purpura: Secondary | ICD-10-CM | POA: Diagnosis not present

## 2018-11-06 DIAGNOSIS — R946 Abnormal results of thyroid function studies: Secondary | ICD-10-CM | POA: Diagnosis not present

## 2018-11-06 DIAGNOSIS — E7849 Other hyperlipidemia: Secondary | ICD-10-CM | POA: Diagnosis not present

## 2019-01-19 DIAGNOSIS — Z23 Encounter for immunization: Secondary | ICD-10-CM | POA: Diagnosis not present

## 2019-01-25 DIAGNOSIS — H25811 Combined forms of age-related cataract, right eye: Secondary | ICD-10-CM | POA: Diagnosis not present

## 2019-01-25 DIAGNOSIS — H25812 Combined forms of age-related cataract, left eye: Secondary | ICD-10-CM | POA: Diagnosis not present

## 2019-01-25 DIAGNOSIS — H02831 Dermatochalasis of right upper eyelid: Secondary | ICD-10-CM | POA: Diagnosis not present

## 2019-01-25 DIAGNOSIS — Z01818 Encounter for other preprocedural examination: Secondary | ICD-10-CM | POA: Diagnosis not present

## 2019-01-25 DIAGNOSIS — H43813 Vitreous degeneration, bilateral: Secondary | ICD-10-CM | POA: Diagnosis not present

## 2019-02-18 DIAGNOSIS — H25812 Combined forms of age-related cataract, left eye: Secondary | ICD-10-CM | POA: Diagnosis not present

## 2019-02-18 DIAGNOSIS — H2512 Age-related nuclear cataract, left eye: Secondary | ICD-10-CM | POA: Diagnosis not present

## 2019-03-11 DIAGNOSIS — H2511 Age-related nuclear cataract, right eye: Secondary | ICD-10-CM | POA: Diagnosis not present

## 2019-03-11 DIAGNOSIS — H25811 Combined forms of age-related cataract, right eye: Secondary | ICD-10-CM | POA: Diagnosis not present

## 2019-04-13 DIAGNOSIS — Z23 Encounter for immunization: Secondary | ICD-10-CM | POA: Diagnosis not present

## 2019-04-29 DIAGNOSIS — E7849 Other hyperlipidemia: Secondary | ICD-10-CM | POA: Diagnosis not present

## 2019-04-29 DIAGNOSIS — Z1212 Encounter for screening for malignant neoplasm of rectum: Secondary | ICD-10-CM | POA: Diagnosis not present

## 2019-04-29 DIAGNOSIS — I10 Essential (primary) hypertension: Secondary | ICD-10-CM | POA: Diagnosis not present

## 2019-04-29 DIAGNOSIS — R82998 Other abnormal findings in urine: Secondary | ICD-10-CM | POA: Diagnosis not present

## 2019-04-29 DIAGNOSIS — Z125 Encounter for screening for malignant neoplasm of prostate: Secondary | ICD-10-CM | POA: Diagnosis not present

## 2019-05-06 DIAGNOSIS — M1990 Unspecified osteoarthritis, unspecified site: Secondary | ICD-10-CM | POA: Diagnosis not present

## 2019-05-06 DIAGNOSIS — E663 Overweight: Secondary | ICD-10-CM | POA: Diagnosis not present

## 2019-05-06 DIAGNOSIS — R809 Proteinuria, unspecified: Secondary | ICD-10-CM | POA: Diagnosis not present

## 2019-05-06 DIAGNOSIS — H6122 Impacted cerumen, left ear: Secondary | ICD-10-CM | POA: Diagnosis not present

## 2019-05-06 DIAGNOSIS — I2510 Atherosclerotic heart disease of native coronary artery without angina pectoris: Secondary | ICD-10-CM | POA: Diagnosis not present

## 2019-05-06 DIAGNOSIS — D696 Thrombocytopenia, unspecified: Secondary | ICD-10-CM | POA: Diagnosis not present

## 2019-05-06 DIAGNOSIS — I119 Hypertensive heart disease without heart failure: Secondary | ICD-10-CM | POA: Diagnosis not present

## 2019-05-06 DIAGNOSIS — H269 Unspecified cataract: Secondary | ICD-10-CM | POA: Diagnosis not present

## 2019-05-06 DIAGNOSIS — I878 Other specified disorders of veins: Secondary | ICD-10-CM | POA: Diagnosis not present

## 2019-05-06 DIAGNOSIS — R350 Frequency of micturition: Secondary | ICD-10-CM | POA: Diagnosis not present

## 2019-05-06 DIAGNOSIS — Z1331 Encounter for screening for depression: Secondary | ICD-10-CM | POA: Diagnosis not present

## 2019-05-06 DIAGNOSIS — Z0000 Encounter for general adult medical examination without abnormal findings: Secondary | ICD-10-CM | POA: Diagnosis not present

## 2019-05-06 DIAGNOSIS — H9190 Unspecified hearing loss, unspecified ear: Secondary | ICD-10-CM | POA: Diagnosis not present

## 2019-05-10 DIAGNOSIS — Z1212 Encounter for screening for malignant neoplasm of rectum: Secondary | ICD-10-CM | POA: Diagnosis not present

## 2019-05-11 DIAGNOSIS — Z23 Encounter for immunization: Secondary | ICD-10-CM | POA: Diagnosis not present

## 2019-05-18 DIAGNOSIS — Z87891 Personal history of nicotine dependence: Secondary | ICD-10-CM | POA: Diagnosis not present

## 2019-05-18 DIAGNOSIS — H6121 Impacted cerumen, right ear: Secondary | ICD-10-CM | POA: Diagnosis not present

## 2019-05-18 DIAGNOSIS — Z974 Presence of external hearing-aid: Secondary | ICD-10-CM | POA: Diagnosis not present

## 2019-05-18 DIAGNOSIS — H9113 Presbycusis, bilateral: Secondary | ICD-10-CM | POA: Diagnosis not present

## 2019-09-02 DIAGNOSIS — N138 Other obstructive and reflux uropathy: Secondary | ICD-10-CM | POA: Diagnosis not present

## 2019-09-02 DIAGNOSIS — N401 Enlarged prostate with lower urinary tract symptoms: Secondary | ICD-10-CM | POA: Diagnosis not present

## 2019-09-02 DIAGNOSIS — R350 Frequency of micturition: Secondary | ICD-10-CM | POA: Diagnosis not present

## 2019-11-11 DIAGNOSIS — M542 Cervicalgia: Secondary | ICD-10-CM | POA: Diagnosis not present

## 2019-12-24 DIAGNOSIS — Z23 Encounter for immunization: Secondary | ICD-10-CM | POA: Diagnosis not present

## 2019-12-27 DIAGNOSIS — M1711 Unilateral primary osteoarthritis, right knee: Secondary | ICD-10-CM | POA: Diagnosis not present

## 2019-12-27 DIAGNOSIS — M25562 Pain in left knee: Secondary | ICD-10-CM | POA: Diagnosis not present

## 2019-12-27 DIAGNOSIS — M25561 Pain in right knee: Secondary | ICD-10-CM | POA: Diagnosis not present

## 2019-12-27 DIAGNOSIS — M1712 Unilateral primary osteoarthritis, left knee: Secondary | ICD-10-CM | POA: Diagnosis not present

## 2020-01-10 DIAGNOSIS — H04552 Acquired stenosis of left nasolacrimal duct: Secondary | ICD-10-CM | POA: Diagnosis not present

## 2020-01-10 DIAGNOSIS — H52222 Regular astigmatism, left eye: Secondary | ICD-10-CM | POA: Diagnosis not present

## 2020-01-10 DIAGNOSIS — H04202 Unspecified epiphora, left lacrimal gland: Secondary | ICD-10-CM | POA: Diagnosis not present

## 2020-01-10 DIAGNOSIS — H5202 Hypermetropia, left eye: Secondary | ICD-10-CM | POA: Diagnosis not present

## 2020-01-10 DIAGNOSIS — H35033 Hypertensive retinopathy, bilateral: Secondary | ICD-10-CM | POA: Diagnosis not present

## 2020-01-10 DIAGNOSIS — Z9849 Cataract extraction status, unspecified eye: Secondary | ICD-10-CM | POA: Diagnosis not present

## 2020-01-10 DIAGNOSIS — Z961 Presence of intraocular lens: Secondary | ICD-10-CM | POA: Diagnosis not present

## 2020-01-10 DIAGNOSIS — I10 Essential (primary) hypertension: Secondary | ICD-10-CM | POA: Diagnosis not present

## 2020-01-10 DIAGNOSIS — H04222 Epiphora due to insufficient drainage, left lacrimal gland: Secondary | ICD-10-CM | POA: Diagnosis not present

## 2020-01-10 DIAGNOSIS — H524 Presbyopia: Secondary | ICD-10-CM | POA: Diagnosis not present

## 2020-01-25 DIAGNOSIS — M170 Bilateral primary osteoarthritis of knee: Secondary | ICD-10-CM | POA: Diagnosis not present

## 2020-01-25 DIAGNOSIS — M25562 Pain in left knee: Secondary | ICD-10-CM | POA: Diagnosis not present

## 2020-01-25 DIAGNOSIS — M25561 Pain in right knee: Secondary | ICD-10-CM | POA: Diagnosis not present

## 2020-02-15 DIAGNOSIS — Z23 Encounter for immunization: Secondary | ICD-10-CM | POA: Diagnosis not present

## 2020-07-12 ENCOUNTER — Other Ambulatory Visit (HOSPITAL_BASED_OUTPATIENT_CLINIC_OR_DEPARTMENT_OTHER): Payer: Self-pay

## 2020-07-12 ENCOUNTER — Other Ambulatory Visit: Payer: Self-pay

## 2020-07-12 ENCOUNTER — Ambulatory Visit: Payer: Medicare Other | Attending: Internal Medicine

## 2020-07-12 DIAGNOSIS — Z23 Encounter for immunization: Secondary | ICD-10-CM

## 2020-07-12 NOTE — Progress Notes (Signed)
   Covid-19 Vaccination Clinic  Name:  Jeremy Sawyer    MRN: [id number] DOB: 19-May-1934  07/12/2020  Mr. Jeremy Sawyer was observed post Covid-19 immunization for 15 minutes without incident. He was provided with Vaccine Information Sheet and instruction to access the V-Safe system.   Mr. Jeremy Sawyer was instructed to call 911 with any severe reactions post vaccine: Marland Kitchen Difficulty breathing  . Swelling of face and throat  . A fast heartbeat  . A bad rash all over body  . Dizziness and weakness   Immunizations Administered    Name Date Dose VIS Date Route   Moderna Covid-19 Booster Vaccine 07/12/2020 12:55 PM 0.25 mL 01/19/2020 Intramuscular   Manufacturer: Moderna   Lot: [id number]   [place]: [id number]

## 2020-07-18 ENCOUNTER — Other Ambulatory Visit (HOSPITAL_BASED_OUTPATIENT_CLINIC_OR_DEPARTMENT_OTHER): Payer: Self-pay

## 2020-07-18 MED ORDER — COVID-19 MRNA VACC (MODERNA) 100 MCG/0.5ML IM SUSP
INTRAMUSCULAR | 0 refills | Status: AC
  Filled 2020-07-18: qty 0.25, 1d supply, fill #0

## 2020-08-17 DIAGNOSIS — E785 Hyperlipidemia, unspecified: Secondary | ICD-10-CM | POA: Diagnosis not present

## 2020-08-17 DIAGNOSIS — Z125 Encounter for screening for malignant neoplasm of prostate: Secondary | ICD-10-CM | POA: Diagnosis not present

## 2020-08-17 DIAGNOSIS — R946 Abnormal results of thyroid function studies: Secondary | ICD-10-CM | POA: Diagnosis not present

## 2020-08-21 IMAGING — DX DG CHEST 1V
1 series · 1 of 1 positions shown · non-contrast
Comparison: None.

CLINICAL DATA: Hip fxhip fracture

EXAM:
CHEST  1 VIEW

[chest ap]
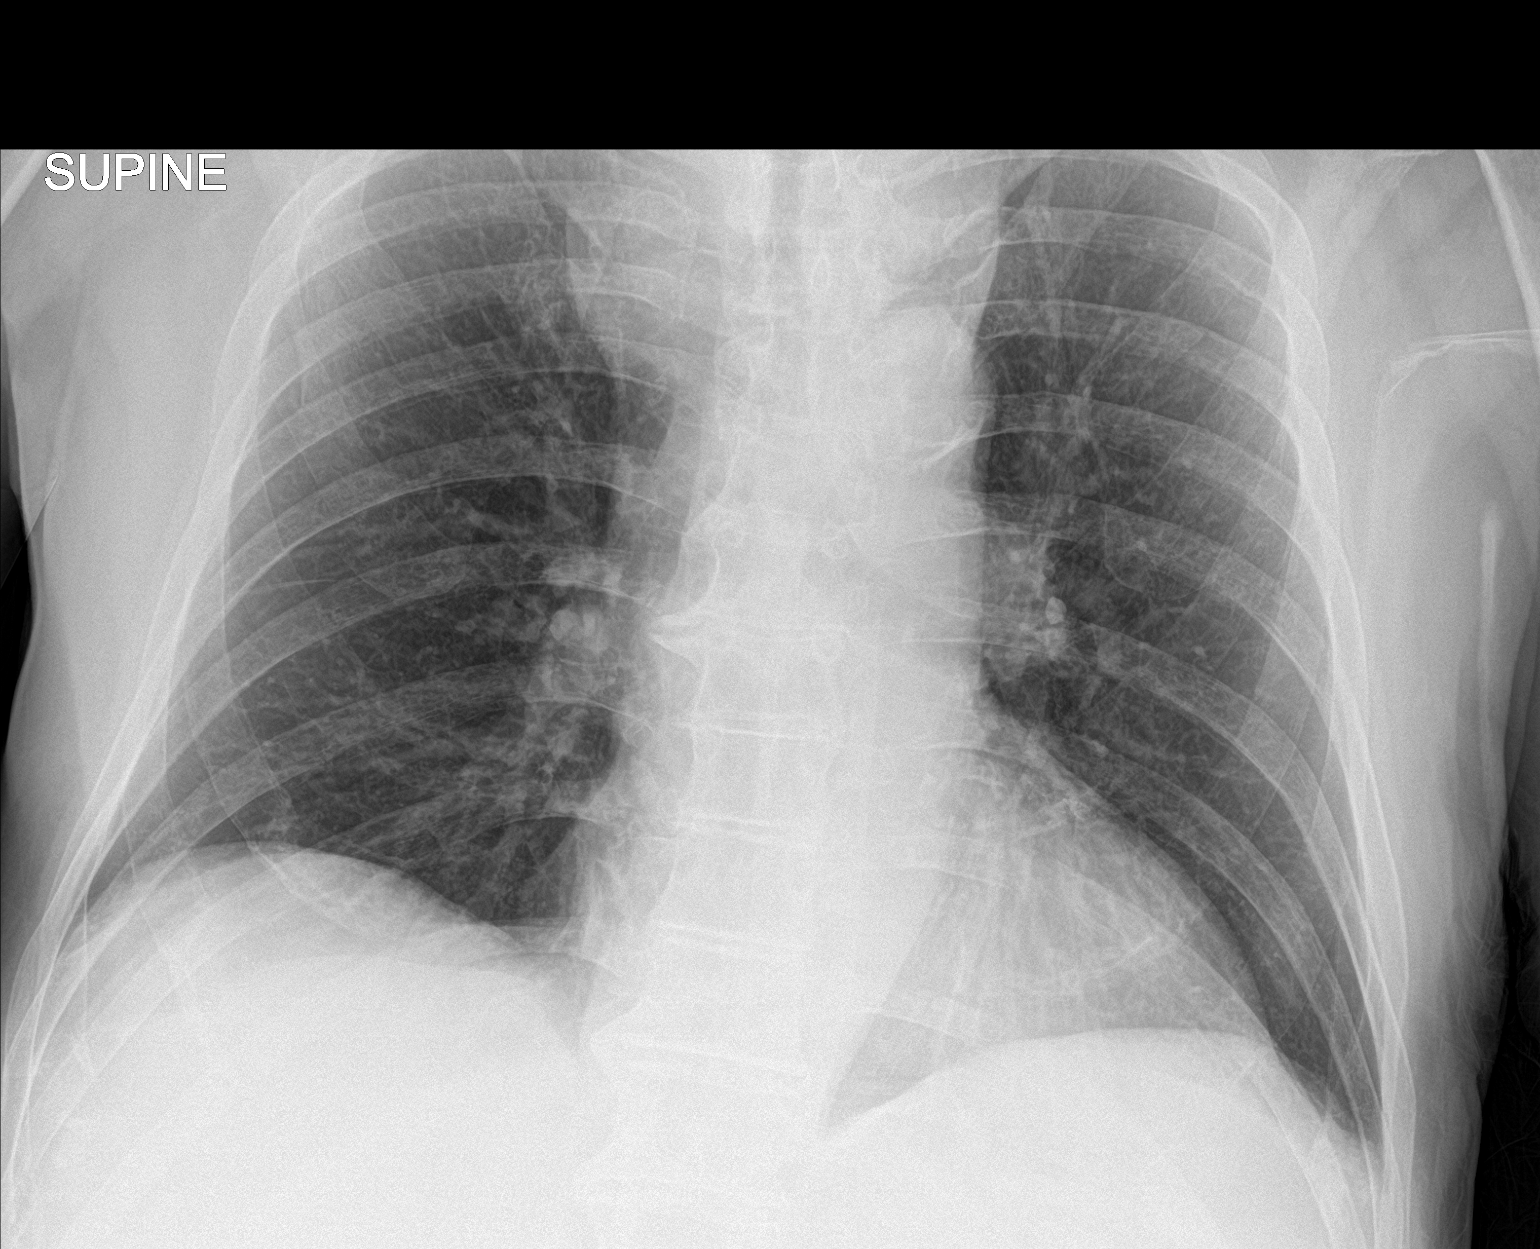

[1 of 1 positions shown; findings below may reference images not displayed]

FINDINGS: Normal mediastinum and cardiac silhouette. Normal pulmonary
vasculature. Prominent vascular shadow along the superior RIGHT
mediastinum unchanged. No evidence of effusion, infiltrate, or
pneumothorax. No acute bony abnormality.
IMPRESSION: No acute cardiopulmonary process.

## 2020-08-24 DIAGNOSIS — R82998 Other abnormal findings in urine: Secondary | ICD-10-CM | POA: Diagnosis not present

## 2020-08-24 DIAGNOSIS — R946 Abnormal results of thyroid function studies: Secondary | ICD-10-CM | POA: Diagnosis not present

## 2020-08-24 DIAGNOSIS — Z1331 Encounter for screening for depression: Secondary | ICD-10-CM | POA: Diagnosis not present

## 2020-08-24 DIAGNOSIS — M1990 Unspecified osteoarthritis, unspecified site: Secondary | ICD-10-CM | POA: Diagnosis not present

## 2020-08-24 DIAGNOSIS — I252 Old myocardial infarction: Secondary | ICD-10-CM | POA: Diagnosis not present

## 2020-08-24 DIAGNOSIS — Z0000 Encounter for general adult medical examination without abnormal findings: Secondary | ICD-10-CM | POA: Diagnosis not present

## 2020-08-24 DIAGNOSIS — I119 Hypertensive heart disease without heart failure: Secondary | ICD-10-CM | POA: Diagnosis not present

## 2020-08-24 DIAGNOSIS — E663 Overweight: Secondary | ICD-10-CM | POA: Diagnosis not present

## 2020-08-24 DIAGNOSIS — I2510 Atherosclerotic heart disease of native coronary artery without angina pectoris: Secondary | ICD-10-CM | POA: Diagnosis not present

## 2020-08-24 DIAGNOSIS — Z1389 Encounter for screening for other disorder: Secondary | ICD-10-CM | POA: Diagnosis not present

## 2020-08-24 DIAGNOSIS — E785 Hyperlipidemia, unspecified: Secondary | ICD-10-CM | POA: Diagnosis not present

## 2020-08-24 DIAGNOSIS — R350 Frequency of micturition: Secondary | ICD-10-CM | POA: Diagnosis not present

## 2020-08-24 DIAGNOSIS — D692 Other nonthrombocytopenic purpura: Secondary | ICD-10-CM | POA: Diagnosis not present

## 2020-09-04 DIAGNOSIS — R350 Frequency of micturition: Secondary | ICD-10-CM | POA: Diagnosis not present

## 2020-09-04 DIAGNOSIS — N401 Enlarged prostate with lower urinary tract symptoms: Secondary | ICD-10-CM | POA: Diagnosis not present

## 2020-12-20 ENCOUNTER — Other Ambulatory Visit (HOSPITAL_BASED_OUTPATIENT_CLINIC_OR_DEPARTMENT_OTHER): Payer: Self-pay

## 2020-12-20 MED ORDER — FLUAD QUADRIVALENT 0.5 ML IM PRSY
PREFILLED_SYRINGE | INTRAMUSCULAR | 0 refills | Status: DC
  Filled 2020-12-20: qty 0.5, 1d supply, fill #0

## 2020-12-21 DIAGNOSIS — Z23 Encounter for immunization: Secondary | ICD-10-CM | POA: Diagnosis not present

## 2021-01-01 ENCOUNTER — Other Ambulatory Visit (HOSPITAL_BASED_OUTPATIENT_CLINIC_OR_DEPARTMENT_OTHER): Payer: Self-pay

## 2021-01-02 ENCOUNTER — Other Ambulatory Visit: Payer: Self-pay

## 2021-01-02 ENCOUNTER — Other Ambulatory Visit (HOSPITAL_BASED_OUTPATIENT_CLINIC_OR_DEPARTMENT_OTHER): Payer: Self-pay

## 2021-01-02 ENCOUNTER — Ambulatory Visit: Payer: Medicare Other | Attending: Internal Medicine

## 2021-01-02 DIAGNOSIS — Z23 Encounter for immunization: Secondary | ICD-10-CM

## 2021-01-02 MED ORDER — MODERNA COVID-19 BIVAL BOOSTER 50 MCG/0.5ML IM SUSP
INTRAMUSCULAR | 0 refills | Status: AC
  Filled 2021-01-02: qty 0.5, 1d supply, fill #0

## 2021-01-02 NOTE — Progress Notes (Signed)
   Covid-19 Vaccination Clinic  Name:  Jeremy Sawyer    MRN: [id number] DOB: 29-Apr-1934  01/02/2021  Jeremy Sawyer was observed post Covid-19 immunization for 15 minutes without incident. He was provided with Vaccine Information Sheet and instruction to access the V-Safe system.   Jeremy Sawyer was instructed to call 911 with any severe reactions post vaccine: Difficulty breathing  Swelling of face and throat  A fast heartbeat  A bad rash all over body  Dizziness and weakness

## 2021-03-27 DIAGNOSIS — Z1212 Encounter for screening for malignant neoplasm of rectum: Secondary | ICD-10-CM | POA: Diagnosis not present

## 2021-08-02 DIAGNOSIS — Z20822 Contact with and (suspected) exposure to covid-19: Secondary | ICD-10-CM | POA: Diagnosis not present

## 2021-08-30 DIAGNOSIS — R946 Abnormal results of thyroid function studies: Secondary | ICD-10-CM | POA: Diagnosis not present

## 2021-08-30 DIAGNOSIS — E785 Hyperlipidemia, unspecified: Secondary | ICD-10-CM | POA: Diagnosis not present

## 2021-08-30 DIAGNOSIS — Z125 Encounter for screening for malignant neoplasm of prostate: Secondary | ICD-10-CM | POA: Diagnosis not present

## 2021-08-30 DIAGNOSIS — I10 Essential (primary) hypertension: Secondary | ICD-10-CM | POA: Diagnosis not present

## 2021-09-06 DIAGNOSIS — R82998 Other abnormal findings in urine: Secondary | ICD-10-CM | POA: Diagnosis not present

## 2021-09-06 DIAGNOSIS — Z1331 Encounter for screening for depression: Secondary | ICD-10-CM | POA: Diagnosis not present

## 2021-09-06 DIAGNOSIS — R946 Abnormal results of thyroid function studies: Secondary | ICD-10-CM | POA: Diagnosis not present

## 2021-09-06 DIAGNOSIS — Z0000 Encounter for general adult medical examination without abnormal findings: Secondary | ICD-10-CM | POA: Diagnosis not present

## 2021-09-06 DIAGNOSIS — I2510 Atherosclerotic heart disease of native coronary artery without angina pectoris: Secondary | ICD-10-CM | POA: Diagnosis not present

## 2021-09-06 DIAGNOSIS — D696 Thrombocytopenia, unspecified: Secondary | ICD-10-CM | POA: Diagnosis not present

## 2021-09-06 DIAGNOSIS — Z23 Encounter for immunization: Secondary | ICD-10-CM | POA: Diagnosis not present

## 2021-09-06 DIAGNOSIS — K439 Ventral hernia without obstruction or gangrene: Secondary | ICD-10-CM | POA: Diagnosis not present

## 2021-09-06 DIAGNOSIS — I119 Hypertensive heart disease without heart failure: Secondary | ICD-10-CM | POA: Diagnosis not present

## 2021-09-06 DIAGNOSIS — G4733 Obstructive sleep apnea (adult) (pediatric): Secondary | ICD-10-CM | POA: Diagnosis not present

## 2021-09-06 DIAGNOSIS — I252 Old myocardial infarction: Secondary | ICD-10-CM | POA: Diagnosis not present

## 2021-09-06 DIAGNOSIS — D692 Other nonthrombocytopenic purpura: Secondary | ICD-10-CM | POA: Diagnosis not present

## 2021-09-06 DIAGNOSIS — E785 Hyperlipidemia, unspecified: Secondary | ICD-10-CM | POA: Diagnosis not present

## 2021-09-06 DIAGNOSIS — Z1339 Encounter for screening examination for other mental health and behavioral disorders: Secondary | ICD-10-CM | POA: Diagnosis not present

## 2021-09-10 DIAGNOSIS — N401 Enlarged prostate with lower urinary tract symptoms: Secondary | ICD-10-CM | POA: Diagnosis not present

## 2021-09-10 DIAGNOSIS — R350 Frequency of micturition: Secondary | ICD-10-CM | POA: Diagnosis not present

## 2021-12-01 DIAGNOSIS — Z23 Encounter for immunization: Secondary | ICD-10-CM | POA: Diagnosis not present

## 2022-01-04 DIAGNOSIS — Z23 Encounter for immunization: Secondary | ICD-10-CM | POA: Diagnosis not present

## 2022-09-03 DIAGNOSIS — Z125 Encounter for screening for malignant neoplasm of prostate: Secondary | ICD-10-CM | POA: Diagnosis not present

## 2022-09-03 DIAGNOSIS — R946 Abnormal results of thyroid function studies: Secondary | ICD-10-CM | POA: Diagnosis not present

## 2022-09-03 DIAGNOSIS — E785 Hyperlipidemia, unspecified: Secondary | ICD-10-CM | POA: Diagnosis not present

## 2022-09-03 DIAGNOSIS — I2510 Atherosclerotic heart disease of native coronary artery without angina pectoris: Secondary | ICD-10-CM | POA: Diagnosis not present

## 2022-09-03 DIAGNOSIS — R7989 Other specified abnormal findings of blood chemistry: Secondary | ICD-10-CM | POA: Diagnosis not present

## 2022-09-03 DIAGNOSIS — N529 Male erectile dysfunction, unspecified: Secondary | ICD-10-CM | POA: Diagnosis not present

## 2022-09-10 DIAGNOSIS — G4733 Obstructive sleep apnea (adult) (pediatric): Secondary | ICD-10-CM | POA: Diagnosis not present

## 2022-09-10 DIAGNOSIS — I2510 Atherosclerotic heart disease of native coronary artery without angina pectoris: Secondary | ICD-10-CM | POA: Diagnosis not present

## 2022-09-10 DIAGNOSIS — R946 Abnormal results of thyroid function studies: Secondary | ICD-10-CM | POA: Diagnosis not present

## 2022-09-10 DIAGNOSIS — Z0000 Encounter for general adult medical examination without abnormal findings: Secondary | ICD-10-CM | POA: Diagnosis not present

## 2022-09-10 DIAGNOSIS — D692 Other nonthrombocytopenic purpura: Secondary | ICD-10-CM | POA: Diagnosis not present

## 2022-09-10 DIAGNOSIS — K439 Ventral hernia without obstruction or gangrene: Secondary | ICD-10-CM | POA: Diagnosis not present

## 2022-09-10 DIAGNOSIS — E785 Hyperlipidemia, unspecified: Secondary | ICD-10-CM | POA: Diagnosis not present

## 2022-09-10 DIAGNOSIS — E663 Overweight: Secondary | ICD-10-CM | POA: Diagnosis not present

## 2022-09-10 DIAGNOSIS — R82998 Other abnormal findings in urine: Secondary | ICD-10-CM | POA: Diagnosis not present

## 2022-09-10 DIAGNOSIS — Z1389 Encounter for screening for other disorder: Secondary | ICD-10-CM | POA: Diagnosis not present

## 2022-09-10 DIAGNOSIS — Z1331 Encounter for screening for depression: Secondary | ICD-10-CM | POA: Diagnosis not present

## 2022-09-10 DIAGNOSIS — I252 Old myocardial infarction: Secondary | ICD-10-CM | POA: Diagnosis not present

## 2022-09-10 DIAGNOSIS — D696 Thrombocytopenia, unspecified: Secondary | ICD-10-CM | POA: Diagnosis not present

## 2022-09-10 DIAGNOSIS — R350 Frequency of micturition: Secondary | ICD-10-CM | POA: Diagnosis not present

## 2022-09-10 DIAGNOSIS — I119 Hypertensive heart disease without heart failure: Secondary | ICD-10-CM | POA: Diagnosis not present

## 2022-09-30 DIAGNOSIS — R350 Frequency of micturition: Secondary | ICD-10-CM | POA: Diagnosis not present

## 2022-09-30 DIAGNOSIS — N401 Enlarged prostate with lower urinary tract symptoms: Secondary | ICD-10-CM | POA: Diagnosis not present

## 2023-01-02 ENCOUNTER — Ambulatory Visit (INDEPENDENT_AMBULATORY_CARE_PROVIDER_SITE_OTHER): Payer: Medicare Other | Admitting: Neurology

## 2023-01-02 ENCOUNTER — Encounter: Payer: Self-pay | Admitting: Neurology

## 2023-01-02 VITALS — BP 122/63 | HR 59 | Ht 69.0 in | Wt 179.0 lb

## 2023-01-02 DIAGNOSIS — M87052 Idiopathic aseptic necrosis of left femur: Secondary | ICD-10-CM | POA: Insufficient documentation

## 2023-01-02 DIAGNOSIS — Z23 Encounter for immunization: Secondary | ICD-10-CM | POA: Diagnosis not present

## 2023-01-02 DIAGNOSIS — I2510 Atherosclerotic heart disease of native coronary artery without angina pectoris: Secondary | ICD-10-CM | POA: Diagnosis not present

## 2023-01-02 DIAGNOSIS — G4733 Obstructive sleep apnea (adult) (pediatric): Secondary | ICD-10-CM | POA: Diagnosis not present

## 2023-01-02 NOTE — Patient Instructions (Signed)
Screening for Sleep Apnea  Sleep apnea is a condition in which breathing pauses or becomes shallow during sleep. Sleep apnea screening is a test to determine if you are at risk for sleep apnea. The test includes a series of questions. It will only takes a few minutes. Your health care provider may ask you to have this test in preparation for surgery or as part of a physical exam. What are the symptoms of sleep apnea? Common symptoms of sleep apnea include: Snoring. Waking up often at night. Daytime sleepiness. Pauses in breathing. Choking or gasping during sleep. Irritability. Forgetfulness. Trouble thinking clearly. Depression. Personality changes. Most people with sleep apnea do not know that they have it. What are the advantages of sleep apnea screening? Getting screened for sleep apnea can help: Ensure your safety. It is important for your health care providers to know whether or not you have sleep apnea, especially if you are having surgery or have other long-term (chronic) health conditions. Improve your health and allow you to get a better night's rest. Restful sleep can help you: Have more energy. Lose weight. Improve high blood pressure. Improve diabetes management. Prevent stroke. Prevent car accidents. What happens during the screening? Screening usually includes being asked a list of questions about your sleep quality. Some questions you may be asked include: Do you snore? Is your sleep restless? Do you have daytime sleepiness? Has a partner or spouse told you that you stop breathing during sleep? Have you had trouble concentrating or memory loss? What is your age? What is your neck circumference? To measure your neck, keep your back straight and gently wrap the tape measure around your neck. Put the tape measure at the middle of your neck, between your chin and collarbone. What is your sex assigned at birth? Do you have or are you being treated for high blood  pressure? If your screening test is positive, you are at risk for the condition. Further testing may be needed to confirm a diagnosis of sleep apnea. Where to find more information You can find screening tools online or at your health care clinic. For more information about sleep apnea screening and healthy sleep, visit these websites: Centers for Disease Control and Prevention: www.cdc.gov American Sleep Apnea Association: www.sleepapnea.org Contact a health care provider if: You think that you may have sleep apnea. Summary Sleep apnea screening can help determine if you are at risk for sleep apnea. It is important for your health care providers to know whether or not you have sleep apnea, especially if you are having surgery or have other chronic health conditions. You may be asked to take a screening test for sleep apnea in preparation for surgery or as part of a physical exam. This information is not intended to replace advice given to you by your health care provider. Make sure you discuss any questions you have with your health care provider. Document Revised: 02/25/2020 Document Reviewed: 02/25/2020 Elsevier Patient Education  2024 Elsevier Inc.  
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
## 2023-02-03 ENCOUNTER — Telehealth: Payer: Self-pay | Admitting: Neurology

## 2023-02-03 NOTE — Telephone Encounter (Signed)
HST- [employer]/aarp no auth req   Patient is scheduled at [facility] for 11/25/24at 1 pm.  Mailed packet to the patient.

## 2023-02-24 ENCOUNTER — Ambulatory Visit (INDEPENDENT_AMBULATORY_CARE_PROVIDER_SITE_OTHER): Payer: Medicare Other | Admitting: Neurology

## 2023-02-24 DIAGNOSIS — G4733 Obstructive sleep apnea (adult) (pediatric): Secondary | ICD-10-CM | POA: Diagnosis not present

## 2023-02-24 DIAGNOSIS — I2510 Atherosclerotic heart disease of native coronary artery without angina pectoris: Secondary | ICD-10-CM

## 2023-02-25 NOTE — Progress Notes (Signed)
[facility] [place] Male, 88 y.o., 11-22-1934 MRN: [id number]   HOME SLEEP TEST REPORT ( by Watch PAT)   STUDY DATE:  02-25-2023   ORDERING CLINICIAN: Melvyn Novas, MD  REFERRING CLINICIAN: Creola Corn, MD    CLINICAL INFORMATION/HISTORY: 01-02-2023:  Jeremy Sawyer is a 88 y.o. Caucasian male patient with a diagnosis of OSA on CPAP  who is seen upon referral on 01/02/2023 from PCP Dr Timothy Lasso, as he is in need of a new machine.  He brought his current CPAP device with him here.  Does not know who was last resting him or following him for apnea. All procedures listed in EPIC were before 2014 and are colonoscopies.  He has been on CPAP since 2000, in [place], referred by Dr Severe.  He is on his second machine , a Resmed 10 autoset and he has this one for at least 6 years, serial number [id number]. He doesn't know his DME company either.   The patient has been followed by his PCP  for hypertension, hyperlipidemia and is status post bilateral hip replacements. CAD with MI and stent.   He does have some trouble with his left leg worse than right.  He has not had a total knee replacement, he has bilateral cataracts and has used Dr. Hyacinth Meeker eye care.  His weight has been stable around 170-175 pounds according to Dr. Ferd Hibbs notes.   Chief concern according to patient :  Needing new baseline, needing new CPAP but has access to new supplies .  CPAP is set at 10 cm water with only 1 cm expiratory pressure relief. His residual AHI is 1.6/h which is great he is using a nasal mask but apparently has great air leakage the 95th percentile air leak is 84 L a minute and this is with a new headgear and the newer mask. So most of the residual apneas are actually unknown neither central nor obstructive.     Epworth sleepiness score: Epworth sleepiness score was endorsed at 6 out of 24 points for this compliant CPAP user, the fatigue severity scale was endorsed at 13 /63  points - low score.    BMI: 26.4 kg/m   Neck Circumference: 17", restricted left nasal airflow, full upper dentures.   FINDINGS:   Sleep Summary:   Total Recording Time (hours, min):     7 hours 57 minutes   Total Sleep Time (hours, min):       6 hours 49 minutes          Percent REM (%):      20%                                  Respiratory Indices:   Calculated pAHI (per hour): Following CMS criteria 6.8/h                           REM pAHI:      Following CMS criteria 7.8/h                                           NREM pAHI:     Following CMS criteria 6.6/h  Positional AHI:     The patient slept 327 minutes in supine with an AHI of 7.7/h following CMS criteria or 15.1/h following AASM criteria.  82 minutes of left lateral sleep were also recorded with an AHI of 1.5 by CMS criteria and 6.1/h by AASM criteria.  So this is a position present.                                              Oxygen Saturation Statistics:   Oxygen Saturation (%) Mean:  94%                   O2 Saturation Range (%):   81% to 99%.                                     O2 Saturation (minutes) <89%:      0.2 minutes     Pulse Rate Statistics:   Pulse Mean (bpm):  60 bpm               Pulse Range:   between 48 and 93 bpm              IMPRESSION:  This HST confirms the presence of mild and purely obstructive sleep apnea when following AASM criteria, but following CMS criteria there is only very mild apnea present.  He still snores.    RECOMMENDATION: If the patient wants to continue CPAP therapy he is invited to do so and his mild apnea still allows to order a new CPAP machine for him.  He currently uses a machine that is set to 10 cm water pressure and I would prescribe an auto titration capable CPAP device set between 5 and 12 cm water pressure with 2 cm EPR and heated humidification.  The patient has used and is on nasal mask that he can continue to use -if he likes to change ,  he can also be fitted by DME for another interface.    INTERPRETING PHYSICIAN:   Melvyn Novas, MD  [facility] Sleep Board certified by The [employer] of Sleep Medicine and Diplomate of the [employer] of Sleep Medicine. Board certified In Neurology through the ABPN, Fellow of the [employer] of Neurology.

## 2023-03-09 NOTE — Procedures (Signed)
[facility] [place] Male, 88 y.o., 11-22-1934 MRN: [id number]   HOME SLEEP TEST REPORT ( by Watch PAT)   STUDY DATE:  02-25-2023   ORDERING CLINICIAN: Melvyn Novas, MD  REFERRING CLINICIAN: Creola Corn, MD    CLINICAL INFORMATION/HISTORY: 01-02-2023:  Jeremy Sawyer is a 88 y.o. Caucasian male patient with a diagnosis of OSA on CPAP  who is seen upon referral on 01/02/2023 from PCP Dr Timothy Lasso, as he is in need of a new machine.  He brought his current CPAP device with him here.  Does not know who was last resting him or following him for apnea. All procedures listed in EPIC were before 2014 and are colonoscopies.  He has been on CPAP since 2000, in [place], referred by Dr Severe.  He is on his second machine , a Resmed 10 autoset and he has this one for at least 6 years, serial number [id number]. He doesn't know his DME company either.   The patient has been followed by his PCP  for hypertension, hyperlipidemia and is status post bilateral hip replacements. CAD with MI and stent.   He does have some trouble with his left leg worse than right.  He has not had a total knee replacement, he has bilateral cataracts and has used Dr. Hyacinth Meeker eye care.  His weight has been stable around 170-175 pounds according to Dr. Ferd Hibbs notes.   Chief concern according to patient :  Needing new baseline, needing new CPAP but has access to new supplies .  CPAP is set at 10 cm water with only 1 cm expiratory pressure relief. His residual AHI is 1.6/h which is great he is using a nasal mask but apparently has great air leakage the 95th percentile air leak is 84 L a minute and this is with a new headgear and the newer mask. So most of the residual apneas are actually unknown neither central nor obstructive.     Epworth sleepiness score: Epworth sleepiness score was endorsed at 6 out of 24 points for this compliant CPAP user, the fatigue severity scale was endorsed at 13 /63  points - low score.    BMI: 26.4 kg/m   Neck Circumference: 17", restricted left nasal airflow, full upper dentures.   FINDINGS:   Sleep Summary:   Total Recording Time (hours, min):     7 hours 57 minutes   Total Sleep Time (hours, min):       6 hours 49 minutes          Percent REM (%):      20%                                  Respiratory Indices:   Calculated pAHI (per hour): Following CMS criteria 6.8/h                           REM pAHI:      Following CMS criteria 7.8/h                                           NREM pAHI:     Following CMS criteria 6.6/h  Positional AHI:     The patient slept 327 minutes in supine with an AHI of 7.7/h following CMS criteria or 15.1/h following AASM criteria.  82 minutes of left lateral sleep were also recorded with an AHI of 1.5 by CMS criteria and 6.1/h by AASM criteria.  So this is a position present.                                              Oxygen Saturation Statistics:   Oxygen Saturation (%) Mean:  94%                   O2 Saturation Range (%):   81% to 99%.                                     O2 Saturation (minutes) <89%:      0.2 minutes     Pulse Rate Statistics:   Pulse Mean (bpm):  60 bpm               Pulse Range:   between 48 and 93 bpm              IMPRESSION:  This HST confirms the presence of mild and purely obstructive sleep apnea when following AASM criteria, but following CMS criteria there is only very mild apnea present.  He still snores.    RECOMMENDATION: If the patient wants to continue CPAP therapy he is invited to do so and his mild apnea still allows to order a new CPAP machine for him.  He currently uses a machine that is set to 10 cm water pressure and I would prescribe an auto titration capable CPAP device set between 5 and 12 cm water pressure with 2 cm EPR and heated humidification.  The patient has used and is on nasal mask that he can continue to use -if he likes to change ,  he can also be fitted by DME for another interface.    INTERPRETING PHYSICIAN:   Melvyn Novas, MD  [facility] Sleep Board certified by The [employer] of Sleep Medicine and Diplomate of the [employer] of Sleep Medicine. Board certified In Neurology through the ABPN, Fellow of the [employer] of Neurology.

## 2023-03-09 NOTE — Addendum Note (Signed)
Addended by: Melvyn Novas on: 03/09/2023 04:22 PM   Modules accepted: Orders

## 2023-03-17 ENCOUNTER — Telehealth: Payer: Self-pay

## 2023-03-17 NOTE — Telephone Encounter (Signed)
I called pt. I advised pt that Dr. Vickey Huger reviewed their sleep study results and found that pt has sleep apnea. Dr. Vickey Huger recommends that pt starts auto CPAP. I reviewed PAP compliance expectations with the pt. Pt is agreeable to starting a CPAP. I advised pt that an order will be sent to a DME, [facility], and [facility] will call the pt within about one week after they file with the pt's insurance. [facility] will show the pt how to use the machine, fit for masks, and troubleshoot the CPAP if needed. A follow up appt was made for insurance purposes with Dr. Vickey Huger on Feb 27,2024 at . Pt verbalized understanding to arrive 15 minutes early and bring their CPAP. Pt verbalized understanding of results. Pt had no questions at this time but was encouraged to call back if questions arise. I have sent the order to [facility] and have received confirmation that they have received the order.  "If the patient wants to continue CPAP therapy he is invited to do so and his mild apnea still allows to order a new CPAP machine for him. He currently uses a machine that is set to 10 cm water pressure and I would prescribe an auto titration capable CPAP device set between 5 and 12 cm water pressure with 2 cm EPR and heated humidification. The patient has used and is on nasal mask that he can continue to use -if he likes to change , he can also be fitted by DME for another interface."

## 2023-03-17 NOTE — Telephone Encounter (Signed)
Pt calling about results from HST, please call back at [phone]

## 2023-05-06 ENCOUNTER — Telehealth: Payer: Self-pay | Admitting: Neurology

## 2023-05-06 ENCOUNTER — Encounter: Payer: Self-pay | Admitting: Neurology

## 2023-05-06 NOTE — Telephone Encounter (Signed)
Called pt, no VM box. Sent letter in mail informing pt of appt change from 05/29/23 to 06/03/23 due to a scheduling error

## 2023-05-29 ENCOUNTER — Ambulatory Visit: Payer: Medicare Other | Admitting: Neurology

## 2023-06-02 NOTE — Progress Notes (Unsigned)
 Marland Kitchen

## 2023-06-03 ENCOUNTER — Ambulatory Visit (INDEPENDENT_AMBULATORY_CARE_PROVIDER_SITE_OTHER): Payer: Medicare Other | Admitting: Neurology

## 2023-06-03 ENCOUNTER — Encounter: Payer: Self-pay | Admitting: Neurology

## 2023-06-03 VITALS — BP 132/59 | HR 63 | Ht 70.0 in | Wt 183.0 lb

## 2023-06-03 DIAGNOSIS — I2510 Atherosclerotic heart disease of native coronary artery without angina pectoris: Secondary | ICD-10-CM

## 2023-06-03 DIAGNOSIS — G4733 Obstructive sleep apnea (adult) (pediatric): Secondary | ICD-10-CM | POA: Diagnosis not present

## 2023-06-03 NOTE — Progress Notes (Addendum)
 Provider:  Melvyn Novas, MD  Primary Care Physician:  Creola Corn, MD [address] [place] [address]     Referring Provider: Creola Corn, Md [address] [place],  [place] [address]          Chief Complaint according to patient   Patient presents with:                HISTORY OF PRESENT ILLNESS:  Jeremy Sawyer is a 89 y.o. male patient who is  recently widowed, his wife passed of pancreatic cancer.  He is here for revisit 06/03/2023 for his compliance with OSA treatment on a new CPAP machine.  .  Social HX : " I live at [facility] and my wife was able to get for at their hospice, she passed on 05-06-2023" .     HST confirmed very mild OSA and only while in supine sleep position:  Calculated pAHI (per hour): Following CMS criteria 6.8/h                           REM pAHI:      Following CMS criteria 7.8/h                                           NREM pAHI:     Following CMS criteria 6.6/h                         Positional AHI:     The patient slept 327 minutes in supine with an AHI of 7.7/h following CMS criteria or 15.1/h following AASM criteria.  82 minutes of left lateral sleep were also recorded with an AHI of 1.5 by CMS criteria and 6.1/h by AASM criteria.    Jeremy Sawyer is a 89 y.o. Caucasian male patient with a diagnosis of OSA on CPAP  who is seen upon referral on 01/02/2023 from PCP Dr Timothy Lasso, as he is in need of a new machine. He brought his current CPAP device with him here. Does not know who was last resting him or following him for apnea. All procedures listed in EPIC were before 2014 and are colonoscopies.  He has been on CPAP since 2000, in [place], referred by Dr Severe.  He is on his second machine , a Resmed 10 autoset and he has this one for 7 years, serial number [id number]. He doesn't know his DME company either.            Chief concern according to patient :  Needing new baseline, needing new CPAP  but has access to new supplies .    I have the pleasure of seeing Jeremy Sawyer 01/02/23 a right -handed male with a History of  OSA sleep disorder.    Sleep relevant medical history: The patient has been followed for hypertension, hyperlipidemia and is status post bilateral hip replacements. CAD with MI and stent.   He does have some trouble with his left leg worse than right.  He has not had a total knee replacement, he has bilateral cataracts and has used Dr. Hyacinth Meeker eye care.  His weight has been stable around 100 7275 pounds according to Dr. Ferd Hibbs notes.  He has regular colonoscopies and the last one according to Dr.  Russo's notes was in November 2023.   Family history his father had colon cancer is mild mother had a metastatic cancer, brother with prostate cancer, 2 brothers have died.  He is married with 2 biological children, educational background bachelor of science, he is a retired Cabin crew tobacco, Web designer, he does not smoke.   He drinks red wine.     Review of Systems: Out of a complete 14 system review, the patient complains of only the following symptoms, and all other reviewed systems are negative.:   ESS 13 / 24, FSS at 18/ 63  points.  This increase he stated is related to the loss of his spouse of 60 years, he has been grieving.     Better air seal on new machine and  new , better fitting mask.  Nasal N 30 Resmed .   GDS 12/ 15 - new grieving.   Social History   Socioeconomic History   Marital status: Widowed, jan 5th 2025.     Spouse name: Not on file   Number of children: 3   Years of education: Not on file   Highest education level: Not on file  Occupational History   Occupation: VP Airline pilot and marketing -  [employer],     Employer: RETIRED    Comment: Teacher, early years/pre  Tobacco Use   Smoking status: Former    Types: Cigarettes   Smokeless tobacco: Never   Tobacco comments:    quit 38yrs ago  Substance and Sexual Activity   Alcohol use: Yes     Comment: drinks 7 glasses wine a week   Drug use: No   Sexual activity: Not Currently  Other Topics Concern   Not on file  Social History Narrative   Not on file   Social Drivers of Health   Financial Resource Strain: Not on file  Food Insecurity: Not on file  Transportation Needs: Not on file  Physical Activity: Not on file  Stress: Not on file  Social Connections: Not on file    Family History  Problem Relation Age of Onset   Heart disease Brother    Colon cancer Brother    Anesthesia problems Neg Hx    Hypotension Neg Hx    Malignant hyperthermia Neg Hx    Pseudochol deficiency Neg Hx    Stomach cancer Neg Hx     Past Medical History:  Diagnosis Date   Arthritis    Bruises easily    CAD (coronary artery disease)    Colon polyps    Groin pain    Right   Hypercholesterolemia    takes Lipitor daily   Hypertension    takes Amlodipine,Ramipril,and Metoprolol daily   Impaired hearing    Joint pain    Joint swelling    Myocardial infarction (HCC)    2003   Osteoarthritis    Right hip   Sleep apnea    uses CPAP;sleep study done > 6yrs ago    Past Surgical History:  Procedure Laterality Date   CARDIAC CATHETERIZATION  >80yrs ago   CARTILAGE SURGERY  1990   Right knee   COLONOSCOPY     CORONARY ANGIOPLASTY     1 stent   KNEE SURGERY     bilateral   TOTAL HIP ARTHROPLASTY  05/15/2011   Procedure: TOTAL HIP ARTHROPLASTY;  Surgeon: Nestor Lewandowsky, MD;  Location: [facility] OR;  Service: Orthopedics;  Laterality: Right;  DEPUY/SROM/PINNACLE   TOTAL HIP ARTHROPLASTY Left 03/09/2018   Procedure:  TOTAL HIP ARTHROPLASTY ANTERIOR APPROACH LEFT HIP Aspiration Fluid LEFT KNEE;  Surgeon: Jodi Geralds, MD;  Location: [facility] OR;  Service: Orthopedics;  Laterality: Left;     Current Outpatient Medications on File Prior to Visit  Medication Sig Dispense Refill   acetaminophen (TYLENOL) 325 MG tablet Take 650 mg by mouth as needed.     amLODipine (NORVASC) 10 MG tablet Take 1  tablet (10 mg total) by mouth daily.     aspirin EC 81 MG tablet Take 81 mg by mouth daily. Swallow whole.     atorvastatin (LIPITOR) 40 MG tablet Take 1 tablet (40 mg total) by mouth daily.     COVID-19 mRNA bivalent vaccine, Moderna, (MODERNA COVID-19 BIVAL BOOSTER) 50 MCG/0.5ML injection Inject into the muscle. 0.5 mL 0   COVID-19 mRNA vaccine, Moderna, 100 MCG/0.5ML injection Inject into the muscle. 0.25 mL 0   diclofenac (VOLTAREN) 75 MG EC tablet Take 75 mg by mouth 2 (two) times daily.     docusate sodium (COLACE) 100 MG capsule Take 1 capsule (100 mg total) by mouth 2 (two) times daily. 10 capsule 0   finasteride (PROSCAR) 5 MG tablet Take 1 tablet (5 mg total) by mouth daily.     HYDROcodone-acetaminophen (NORCO/VICODIN) 5-325 MG tablet Take 1 tablet by mouth every 6 (six) hours as needed for moderate pain (pain score 4-6).     influenza vaccine adjuvanted (FLUAD QUADRIVALENT) 0.5 ML injection Inject into the muscle. 0.5 mL 0   methocarbamol (ROBAXIN) 500 MG tablet Take 1 tablet (500 mg total) by mouth every 6 (six) hours as needed for muscle spasms.     metoprolol succinate (TOPROL-XL) 100 MG 24 hr tablet Take 1 tablet (100 mg total) by mouth daily. Take with or immediately following a meal.     polyethylene glycol (MIRALAX / GLYCOLAX) packet Take 17 g by mouth daily as needed for mild constipation. 14 each 0   senna-docusate (SENOKOT-S) 8.6-50 MG tablet Take 1 tablet by mouth at bedtime as needed for mild constipation.     tamsulosin (FLOMAX) 0.4 MG CAPS capsule Take 1 capsule (0.4 mg total) by mouth daily.     No current facility-administered medications on file prior to visit.    No Known Allergies   DIAGNOSTIC DATA (LABS, IMAGING, TESTING) - I reviewed patient records, labs, notes, testing and imaging myself where available.  Lab Results  Component Value Date   WBC 10.6 03/18/2018   HGB 11.2 (A) 03/18/2018   HCT 34 (A) 03/18/2018   MCV 94.2 03/12/2018   PLT 403 (A)  03/18/2018      Component Value Date/Time   NA 137 03/10/2018 0234   K 3.6 03/10/2018 0234   CL 101 03/10/2018 0234   CO2 26 03/10/2018 0234   GLUCOSE 169 (H) 03/10/2018 0234   BUN 16 03/10/2018 0234   CREATININE 0.75 03/10/2018 0234   CALCIUM 8.1 (L) 03/10/2018 0234   GFRNONAA >60 03/10/2018 0234   GFRAA >60 03/10/2018 0234   No results found for: "CHOL", "HDL", "LDLCALC", "LDLDIRECT", "TRIG", "CHOLHDL" No results found for: "HGBA1C" No results found for: "VITAMINB12" No results found for: "TSH"  PHYSICAL EXAM:  Today's Vitals   06/03/23 1251  BP: (!) 132/59  Pulse: 63  Weight: 183 lb (83 kg)  Height: 5\' 10"  (1.778 m)   Body mass index is 26.26 kg/m.   Wt Readings from Last 3 Encounters:  06/03/23 183 lb (83 kg)  01/02/23 179 lb (81.2 kg)  03/31/18 183 lb (  83 kg)     Ht Readings from Last 3 Encounters:  06/03/23 5\' 10"  (1.778 m)  01/02/23 5\' 9"  (1.753 m)  03/31/18 5\' 10"  (1.778 m)      General: The patient is awake, alert and appears not in acute distress. The patient is well groomed. Head: Normocephalic, atraumatic.   The patient is awake, alert and appears not in acute distress. The patient is well groomed. Head: Normocephalic, atraumatic. Neck is supple.  Mallampati 2,  neck circumference:17 inches . Nasal airflow restricted, left side - Retrognathia is not seen.  Dental status: biological on the bottom, full late dentures - upper .  Cardiovascular:  Regular rate and cardiac rhythm by pulse,  without distended neck veins. Respiratory: Lungs are clear to auscultation.  Skin:  Without evidence of ankle edema, buising petechiae, hands are discoloured. Oon blood thinners (ASA baby) Trunk: The patient's posture is erect.   NEUROLOGIC EXAM: The patient is awake and alert, oriented to place and time.   Memory subjective described as intact.  Attention span & concentration ability appears normal.  Speech is fluent,  without  dysarthria, dysphonia or aphasia.   Mood and affect are appropriate.   Cranial nerves: no loss of smell or taste reported  Pupils are equal and briskly reactive to light. Funduscopic exam deferred.  Extraocular movements in vertical and horizontal planes were intact and without nystagmus. No Diplopia. Visual fields by finger perimetry are intact. Hearing was impaired with hearing aids.   Facial sensation intact to fine touch.  Facial motor strength is symmetric and tongue and uvula move midline.  Neck ROM : rotation, tilt and flexion extension were normal for age and shoulder shrug was symmetrical.    Motor exam:  Symmetric bulk, tone and ROM.   Normal tone without cog- wheeling, symmetric grip strength . He has lost muscle  in both hands, atrophied thenar eminence .  Sensory:  Vibration tested  and  perceived at both ankles (!)   Proprioception tested in the upper extremities was normal. Coordination:  The Finger-to-nose maneuver was intact without evidence of ataxia, dysmetria =but clearly with action tremor.    Gait and station: Patient could rise unassisted from a seated position, walked without assistive device.  Stance is of normal width/ base. Storch gait. Toe and heel walk were deferred.   Deep tendon reflexes: in the  upper and lower extremities are symmetric and intact. Intact patella reflexes.    Babinski response was deferred.    ASSESSMENT AND PLAN 89 y.o. year old male  here with:  Mild OSA according to HST scored under CMS criteria.      1) high compliance to CPAP 6-12 cm water , 95% pressure of 10 cm water, no trouble with air leaks.    2) no change in settings needed as the residual AHI is now 0.6/ h.   3) patient stated he sleeps on his sides, but his HST  documented majority sleep time in supine. If he could avoid sleeping supine, his apnea would be much lower, and no CPAP would be needed.     I plan to follow up either personally or through our NP within 12 months.   I would like to  thank Creola Corn, MD and Creola Corn, Md [address] [place],  [place] [address] for allowing me to meet with and to take care of this pleasant patient.    After spending a total time of  15  minutes face to face and  additional time for physical and neurologic examination, review of laboratory studies,  personal review of imaging studies, reports and results of other testing and review of referral information / records as far as provided in visit,   Electronically signed by: Melvyn Novas, MD 06/03/2023 1:05 PM  [facility] Board certified by The [employer] of Sleep Medicine and Diplomate of the [employer] of Sleep Medicine. Board certified In Neurology through the ABPN, Fellow of the [employer] of Neu[employer].

## 2023-06-03 NOTE — Patient Instructions (Signed)
Please continue using your CPAP regularly. While your insurance requires that you use CPAP at least 4 hours each night on 70% of the nights, I recommend, that you not skip any nights and use it throughout the night if you can. Getting used to CPAP and staying with the treatment long term does take time and patience and discipline. Untreated obstructive sleep apnea when it is moderate to severe can have an adverse impact on cardiovascular health and raise her risk for heart disease, arrhythmias, hypertension, congestive heart failure, stroke and diabetes. Untreated obstructive sleep apnea causes sleep disruption, nonrestorative sleep, and sleep deprivation. This can have an impact on your day to day functioning and cause daytime sleepiness and impairment of cognitive function, memory loss, mood disturbance, and problems focussing. Using CPAP regularly can improve these symptoms. ° ° °

## 2023-09-08 DIAGNOSIS — E7849 Other hyperlipidemia: Secondary | ICD-10-CM | POA: Diagnosis not present

## 2023-09-08 DIAGNOSIS — I119 Hypertensive heart disease without heart failure: Secondary | ICD-10-CM | POA: Diagnosis not present

## 2023-09-08 DIAGNOSIS — Z125 Encounter for screening for malignant neoplasm of prostate: Secondary | ICD-10-CM | POA: Diagnosis not present

## 2023-09-08 DIAGNOSIS — E785 Hyperlipidemia, unspecified: Secondary | ICD-10-CM | POA: Diagnosis not present

## 2023-09-08 DIAGNOSIS — E039 Hypothyroidism, unspecified: Secondary | ICD-10-CM | POA: Diagnosis not present

## 2023-09-15 DIAGNOSIS — R82998 Other abnormal findings in urine: Secondary | ICD-10-CM | POA: Diagnosis not present

## 2023-09-15 DIAGNOSIS — Z0000 Encounter for general adult medical examination without abnormal findings: Secondary | ICD-10-CM | POA: Diagnosis not present

## 2023-09-15 DIAGNOSIS — R809 Proteinuria, unspecified: Secondary | ICD-10-CM | POA: Diagnosis not present

## 2023-09-15 DIAGNOSIS — M179 Osteoarthritis of knee, unspecified: Secondary | ICD-10-CM | POA: Diagnosis not present

## 2023-09-15 DIAGNOSIS — K439 Ventral hernia without obstruction or gangrene: Secondary | ICD-10-CM | POA: Diagnosis not present

## 2023-09-15 DIAGNOSIS — R946 Abnormal results of thyroid function studies: Secondary | ICD-10-CM | POA: Diagnosis not present

## 2023-09-15 DIAGNOSIS — Z1339 Encounter for screening examination for other mental health and behavioral disorders: Secondary | ICD-10-CM | POA: Diagnosis not present

## 2023-09-15 DIAGNOSIS — Z1331 Encounter for screening for depression: Secondary | ICD-10-CM | POA: Diagnosis not present

## 2023-09-15 DIAGNOSIS — E785 Hyperlipidemia, unspecified: Secondary | ICD-10-CM | POA: Diagnosis not present

## 2023-09-15 DIAGNOSIS — D696 Thrombocytopenia, unspecified: Secondary | ICD-10-CM | POA: Diagnosis not present

## 2023-09-15 DIAGNOSIS — I119 Hypertensive heart disease without heart failure: Secondary | ICD-10-CM | POA: Diagnosis not present

## 2023-09-15 DIAGNOSIS — R350 Frequency of micturition: Secondary | ICD-10-CM | POA: Diagnosis not present

## 2023-09-15 DIAGNOSIS — E663 Overweight: Secondary | ICD-10-CM | POA: Diagnosis not present

## 2023-09-15 DIAGNOSIS — I10 Essential (primary) hypertension: Secondary | ICD-10-CM | POA: Diagnosis not present

## 2023-09-23 DIAGNOSIS — R350 Frequency of micturition: Secondary | ICD-10-CM | POA: Diagnosis not present

## 2023-09-23 DIAGNOSIS — N401 Enlarged prostate with lower urinary tract symptoms: Secondary | ICD-10-CM | POA: Diagnosis not present

## 2023-12-30 DIAGNOSIS — Z23 Encounter for immunization: Secondary | ICD-10-CM | POA: Diagnosis not present

## 2024-04-02 ENCOUNTER — Inpatient Hospital Stay (HOSPITAL_COMMUNITY)
Admission: EM | Admit: 2024-04-02 | Discharge: 2024-04-08 | DRG: 378 | Disposition: A | Discharge status: Skilled Nursing Facility | Source: Skilled Nursing Facility | Attending: Internal Medicine | Admitting: Internal Medicine

## 2024-04-02 ENCOUNTER — Observation Stay (HOSPITAL_COMMUNITY)

## 2024-04-02 ENCOUNTER — Other Ambulatory Visit: Payer: Self-pay

## 2024-04-02 ENCOUNTER — Encounter (HOSPITAL_COMMUNITY): Payer: Self-pay | Admitting: Internal Medicine

## 2024-04-02 DIAGNOSIS — M25462 Effusion, left knee: Secondary | ICD-10-CM

## 2024-04-02 DIAGNOSIS — E875 Hyperkalemia: Secondary | ICD-10-CM | POA: Diagnosis present

## 2024-04-02 DIAGNOSIS — D62 Acute posthemorrhagic anemia: Secondary | ICD-10-CM | POA: Diagnosis not present

## 2024-04-02 DIAGNOSIS — Z8249 Family history of ischemic heart disease and other diseases of the circulatory system: Secondary | ICD-10-CM

## 2024-04-02 DIAGNOSIS — Z955 Presence of coronary angioplasty implant and graft: Secondary | ICD-10-CM

## 2024-04-02 DIAGNOSIS — Z800 Family history of malignant neoplasm of digestive organs: Secondary | ICD-10-CM

## 2024-04-02 DIAGNOSIS — I872 Venous insufficiency (chronic) (peripheral): Secondary | ICD-10-CM | POA: Diagnosis present

## 2024-04-02 DIAGNOSIS — Z87891 Personal history of nicotine dependence: Secondary | ICD-10-CM

## 2024-04-02 DIAGNOSIS — N179 Acute kidney failure, unspecified: Secondary | ICD-10-CM | POA: Diagnosis present

## 2024-04-02 DIAGNOSIS — Z96642 Presence of left artificial hip joint: Secondary | ICD-10-CM | POA: Diagnosis present

## 2024-04-02 DIAGNOSIS — G8929 Other chronic pain: Secondary | ICD-10-CM | POA: Diagnosis present

## 2024-04-02 DIAGNOSIS — D696 Thrombocytopenia, unspecified: Secondary | ICD-10-CM | POA: Diagnosis present

## 2024-04-02 DIAGNOSIS — E785 Hyperlipidemia, unspecified: Secondary | ICD-10-CM | POA: Diagnosis present

## 2024-04-02 DIAGNOSIS — E7800 Pure hypercholesterolemia, unspecified: Secondary | ICD-10-CM | POA: Diagnosis present

## 2024-04-02 DIAGNOSIS — I252 Old myocardial infarction: Secondary | ICD-10-CM

## 2024-04-02 DIAGNOSIS — K921 Melena: Principal | ICD-10-CM | POA: Diagnosis present

## 2024-04-02 DIAGNOSIS — K5731 Diverticulosis of large intestine without perforation or abscess with bleeding: Secondary | ICD-10-CM | POA: Diagnosis not present

## 2024-04-02 DIAGNOSIS — N400 Enlarged prostate without lower urinary tract symptoms: Secondary | ICD-10-CM | POA: Diagnosis present

## 2024-04-02 DIAGNOSIS — G4733 Obstructive sleep apnea (adult) (pediatric): Secondary | ICD-10-CM | POA: Diagnosis present

## 2024-04-02 DIAGNOSIS — Z66 Do not resuscitate: Secondary | ICD-10-CM | POA: Diagnosis present

## 2024-04-02 DIAGNOSIS — M170 Bilateral primary osteoarthritis of knee: Secondary | ICD-10-CM | POA: Diagnosis present

## 2024-04-02 DIAGNOSIS — Z7982 Long term (current) use of aspirin: Secondary | ICD-10-CM

## 2024-04-02 DIAGNOSIS — I2510 Atherosclerotic heart disease of native coronary artery without angina pectoris: Secondary | ICD-10-CM | POA: Diagnosis present

## 2024-04-02 DIAGNOSIS — Z79899 Other long term (current) drug therapy: Secondary | ICD-10-CM

## 2024-04-02 DIAGNOSIS — K641 Second degree hemorrhoids: Secondary | ICD-10-CM | POA: Diagnosis present

## 2024-04-02 DIAGNOSIS — K922 Gastrointestinal hemorrhage, unspecified: Secondary | ICD-10-CM

## 2024-04-02 DIAGNOSIS — I10 Essential (primary) hypertension: Secondary | ICD-10-CM | POA: Diagnosis present

## 2024-04-02 DIAGNOSIS — Z860101 Personal history of adenomatous and serrated colon polyps: Secondary | ICD-10-CM

## 2024-04-02 LAB — HEMOGLOBIN AND HEMATOCRIT, BLOOD
HCT: 32.9 % — ABNORMAL LOW (ref 39.0–52.0)
HCT: 33.8 % — ABNORMAL LOW (ref 39.0–52.0)
Hemoglobin: 10.9 g/dL — ABNORMAL LOW (ref 13.0–17.0)
Hemoglobin: 11.5 g/dL — ABNORMAL LOW (ref 13.0–17.0)

## 2024-04-02 LAB — COMPREHENSIVE METABOLIC PANEL WITH GFR
ALT: 24 U/L (ref 0–44)
AST: 24 U/L (ref 15–41)
Albumin: 3.8 g/dL (ref 3.5–5.0)
Alkaline Phosphatase: 75 U/L (ref 38–126)
Anion gap: 9 (ref 5–15)
BUN: 25 mg/dL — ABNORMAL HIGH (ref 8–23)
CO2: 22 mmol/L (ref 22–32)
Calcium: 8.5 mg/dL — ABNORMAL LOW (ref 8.9–10.3)
Chloride: 111 mmol/L (ref 98–111)
Creatinine, Ser: 1.22 mg/dL (ref 0.61–1.24)
GFR, Estimated: 57 mL/min — ABNORMAL LOW
Glucose, Bld: 101 mg/dL — ABNORMAL HIGH (ref 70–99)
Potassium: 4.4 mmol/L (ref 3.5–5.1)
Sodium: 141 mmol/L (ref 135–145)
Total Bilirubin: 0.7 mg/dL (ref 0.0–1.2)
Total Protein: 6.4 g/dL — ABNORMAL LOW (ref 6.5–8.1)

## 2024-04-02 LAB — CBC
HCT: 39.8 % (ref 39.0–52.0)
Hemoglobin: 13 g/dL (ref 13.0–17.0)
MCH: 31.5 pg (ref 26.0–34.0)
MCHC: 32.7 g/dL (ref 30.0–36.0)
MCV: 96.4 fL (ref 80.0–100.0)
Platelets: 135 K/uL — ABNORMAL LOW (ref 150–400)
RBC: 4.13 MIL/uL — ABNORMAL LOW (ref 4.22–5.81)
RDW: 13.5 % (ref 11.5–15.5)
WBC: 9.6 K/uL (ref 4.0–10.5)
nRBC: 0 % (ref 0.0–0.2)

## 2024-04-02 LAB — POC OCCULT BLOOD, ED: Occult Blood: POSITIVE

## 2024-04-02 MED ORDER — ACETAMINOPHEN 325 MG PO TABS
650.0000 mg | ORAL_TABLET | Freq: Four times a day (QID) | ORAL | Status: DC | PRN
  Administered 2024-04-06 (×2): 650 mg via ORAL
  Filled 2024-04-02 (×2): qty 2

## 2024-04-02 MED ORDER — ALBUTEROL SULFATE (2.5 MG/3ML) 0.083% IN NEBU: 2.5000 mg | INHALATION_SOLUTION | RESPIRATORY_TRACT | Status: DC | PRN

## 2024-04-02 MED ORDER — ACETAMINOPHEN 650 MG RE SUPP: 650.0000 mg | Freq: Four times a day (QID) | RECTAL | Status: DC | PRN

## 2024-04-02 MED ORDER — IOHEXOL 350 MG/ML SOLN
100.0000 mL | Freq: Once | INTRAVENOUS | Status: AC | PRN
  Administered 2024-04-02: 100 mL via INTRAVENOUS

## 2024-04-02 MED ORDER — NA SULFATE-K SULFATE-MG SULF 17.5-3.13-1.6 GM/177ML PO SOLN
0.5000 | Freq: Once | ORAL | Status: AC
  Administered 2024-04-02: 177 mL via ORAL
  Filled 2024-04-02: qty 1

## 2024-04-02 MED ORDER — ONDANSETRON HCL 4 MG PO TABS: 4.0000 mg | ORAL_TABLET | Freq: Four times a day (QID) | ORAL | Status: DC | PRN

## 2024-04-02 MED ORDER — ONDANSETRON HCL 4 MG/2ML IJ SOLN
4.0000 mg | Freq: Four times a day (QID) | INTRAMUSCULAR | Status: DC | PRN
  Administered 2024-04-02: 4 mg via INTRAVENOUS
  Filled 2024-04-02: qty 2

## 2024-04-02 MED ORDER — SODIUM CHLORIDE 0.9 % IV SOLN
INTRAVENOUS | Status: AC
  Administered 2024-04-02: 20 mL via INTRAVENOUS

## 2024-04-02 MED ORDER — NA SULFATE-K SULFATE-MG SULF 17.5-3.13-1.6 GM/177ML PO SOLN
0.5000 | Freq: Once | ORAL | Status: AC
  Administered 2024-04-03: 177 mL via ORAL
  Filled 2024-04-02 (×2): qty 1

## 2024-04-02 MED ORDER — TRAZODONE HCL 50 MG PO TABS: 25.0000 mg | ORAL_TABLET | Freq: Every evening | ORAL | Status: DC | PRN

## 2024-04-02 NOTE — Plan of Care (Signed)

## 2024-04-02 NOTE — H&P (Signed)
 " History and Physical  Jeremy Sawyer FMW:[id number] DOB: 11-Feb-1935 DOA: 04/02/2024  PCP: [name], MD   Chief Complaint: Rectal bleeding  HPI: Jeremy Sawyer is a 89 y.o. male with medical history significant for hypertension, hyperlipidemia, CAD, BPH on daily baby aspirin  being admitted to the hospital with 12 hours of hematochezia.  Patient denies abdominal pain, nausea, vomiting, chest pain, dizziness, syncope or diarrhea.  He has never had GI bleeding before, takes a baby aspirin  and took the last dose yesterday.  Last night about 11 PM, he had a bowel movement that was brown stool mixed with bright red blood.  This has happened several times through the night, and also happened once just now in the emergency department this morning.  Patient states he has had routine colonoscopies, does not recall ever being told about an abnormality.  Review of Systems: Please see HPI for pertinent positives and negatives. A complete 10 system review of systems are otherwise negative.  Past Medical History:  Diagnosis Date   Arthritis    Bruises easily    CAD (coronary artery disease)    Colon polyps    Groin pain    Right   Hypercholesterolemia    takes Lipitor daily   Hypertension    takes Amlodipine ,Ramipril ,and Metoprolol  daily   Impaired hearing    Joint pain    Joint swelling    Myocardial infarction [facility])    2003   Osteoarthritis    Right hip   Sleep apnea    uses CPAP;sleep study done > 80yrs ago   Past Surgical History:  Procedure Laterality Date   CARDIAC CATHETERIZATION  >18yrs ago   CARTILAGE SURGERY  1990   Right knee   COLONOSCOPY     CORONARY ANGIOPLASTY     1 stent   KNEE SURGERY     bilateral   TOTAL HIP ARTHROPLASTY  05/15/2011   Procedure: TOTAL HIP ARTHROPLASTY;  Surgeon: Dempsey JINNY Sensor, MD;  Location: [facility] OR;  Service: Orthopedics;  Laterality: Right;  DEPUY/SROM/PINNACLE   TOTAL HIP ARTHROPLASTY Left 03/09/2018   Procedure: TOTAL HIP ARTHROPLASTY  ANTERIOR APPROACH LEFT HIP Aspiration Fluid LEFT KNEE;  Surgeon: Yvone Rush, MD;  Location: [facility] OR;  Service: Orthopedics;  Laterality: Left;   Social History:  reports that he has quit smoking. His smoking use included cigarettes. He has never used smokeless tobacco. He reports current alcohol  use. He reports that he does not use drugs.  No Known Allergies  Family History  Problem Relation Age of Onset   Heart disease Brother    Colon cancer Brother    Anesthesia problems Neg Hx    Hypotension Neg Hx    Malignant hyperthermia Neg Hx    Pseudochol deficiency Neg Hx    Stomach cancer Neg Hx      Prior to Admission medications  Medication Sig Start Date End Date Taking? Authorizing Provider  acetaminophen  (TYLENOL ) 325 MG tablet Take 650 mg by mouth as needed.    [provider]  amLODipine  (NORVASC ) 10 MG tablet Take 1 tablet (10 mg total) by mouth daily. 03/13/18   Briana Joya BIRCH, MD  aspirin  EC 81 MG tablet Take 81 mg by mouth daily. Swallow whole.    [provider]  atorvastatin  (LIPITOR) 40 MG tablet Take 1 tablet (40 mg total) by mouth daily. 03/13/18   Briana Joya D, MD  COVID-19 mRNA bivalent vaccine, Moderna, (MODERNA COVID-19 BIVAL BOOSTER) 50 MCG/0.5ML injection Inject into the muscle. 01/02/21  COVID-19 mRNA vaccine, Moderna, 100 MCG/0.5ML injection Inject into the muscle. 07/12/20   Luiz Channel, MD  diclofenac (VOLTAREN) 75 MG EC tablet Take 75 mg by mouth 2 (two) times daily.    [provider]  docusate sodium  (COLACE) 100 MG capsule Take 1 capsule (100 mg total) by mouth 2 (two) times daily. 03/13/18   Briana Joya BIRCH, MD  finasteride  (PROSCAR ) 5 MG tablet Take 1 tablet (5 mg total) by mouth daily. 03/13/18   Briana Joya BIRCH, MD  HYDROcodone -acetaminophen  (NORCO/VICODIN) 5-325 MG tablet Take 1 tablet by mouth every 6 (six) hours as needed for moderate pain (pain score 4-6).    [provider]  influenza vaccine adjuvanted  (FLUAD  QUADRIVALENT) 0.5 ML injection Inject into the muscle. 12/20/20   Luiz Channel, MD  methocarbamol  (ROBAXIN ) 500 MG tablet Take 1 tablet (500 mg total) by mouth every 6 (six) hours as needed for muscle spasms. 03/13/18   Briana Joya BIRCH, MD  metoprolol  succinate (TOPROL -XL) 100 MG 24 hr tablet Take 1 tablet (100 mg total) by mouth daily. Take with or immediately following a meal. 03/13/18   Nettey, Shayla D, MD  polyethylene glycol (MIRALAX  / GLYCOLAX ) packet Take 17 g by mouth daily as needed for mild constipation. 03/13/18   Briana Joya BIRCH, MD  senna-docusate (SENOKOT-S) 8.6-50 MG tablet Take 1 tablet by mouth at bedtime as needed for mild constipation. 03/13/18   Briana Joya D, MD  tamsulosin  (FLOMAX ) 0.4 MG CAPS capsule Take 1 capsule (0.4 mg total) by mouth daily. 03/13/18   Briana Joya BIRCH, MD    Physical Exam: BP 128/75 (BP Location: Right Arm)   Pulse 62   Temp 98 F (36.7 C) (Oral)   Resp 16   SpO2 99%  General:  Alert, oriented, calm, in no acute distress  Eyes: EOMI, clear conjuctivae, white sclerea Neck: supple, no masses, trachea mildline  Cardiovascular: RRR, no murmurs or rubs, no peripheral edema  Respiratory: clear to auscultation bilaterally, no wheezes, no crackles  Abdomen: soft, nontender, nondistended, normal bowel tones heard  Skin: dry, no rashes  Musculoskeletal: no joint effusions, normal range of motion  Psychiatric: appropriate affect, normal speech  Neurologic: extraocular muscles intact, clear speech, moving all extremities with intact sensorium         Labs on Admission:  Basic Metabolic Panel: Recent Labs  Lab 04/02/24 0830  NA 141  K 4.4  CL 111  CO2 22  GLUCOSE 101*  BUN 25*  CREATININE 1.22  CALCIUM  8.5*   Liver Function Tests: Recent Labs  Lab 04/02/24 0830  AST 24  ALT 24  ALKPHOS 75  BILITOT 0.7  PROT 6.4*  ALBUMIN 3.8   No results for input(s): LIPASE, AMYLASE in the last 168 hours. No results for input(s):  AMMONIA in the last 168 hours. CBC: Recent Labs  Lab 04/02/24 0830  WBC 9.6  HGB 13.0  HCT 39.8  MCV 96.4  PLT 135*   Cardiac Enzymes: No results for input(s): CKTOTAL, CKMB, CKMBINDEX, TROPONINI in the last 168 hours. BNP (last 3 results) No results for input(s): BNP in the last 8760 hours.  ProBNP (last 3 results) No results for input(s): PROBNP in the last 8760 hours.  CBG: No results for input(s): GLUCAP in the last 168 hours.  Radiological Exams on Admission: No results found. Assessment/Plan Jeremy Sawyer is a 89 y.o. male with medical history significant for hypertension, hyperlipidemia, CAD, BPH on daily baby aspirin  being admitted to the hospital  with 12 hours of hematochezia.   Hematochezia-possibly diverticular bleeding versus angiodysplasia versus other.  Patient is hemodynamically stable without significant anemia currently. -Observation admission -Monitor closely on progressive unit -Trend hemoglobin, hold aspirin  and avoid blood thinners -Clear liquid diet for now -GI consult appreciated  Hypertension-will resume home Toprol  and Norvasc  once medication doses are reconciled, will hold for hypotension  Hyperlipidemia-Lipitor  BPH-Proscar , Flomax   DVT prophylaxis: SCDs only    Code Status: Limited: Do not attempt resuscitation (DNR) -DNR-LIMITED -Do Not Intubate/DNI , confirmed with the patient at the time of admission.  DNR goldenrod form at the bedside reviewed.  Consults called: [facility]  Admission status: Observation  Time spent: 49 minutes  Mayuri Staples CHRISTELLA Gail MD [facility] Pager [phone]  If 7PM-7AM, please contact night-coverage [email or web address] Password [email or web address]  04/02/2024, 10:52 AM   "

## 2024-04-02 NOTE — ED Provider Notes (Signed)
 " Jeremy Sawyer Provider Note   CSN: [id number] Arrival date & time: 04/02/24  9185     Patient presents with: Hematochezia   Jeremy Sawyer is a 89 y.o. male.   This is a 89 year old male presenting emergency department for blood in stool.  Not on blood thinner.  Symptoms started last night and reported several episodes overnight of bloody bowel movements.  No history of GI bleed.  Has some lightheadedness, but otherwise feels normal self.  Not having abdominal pain.        Prior to Admission medications  Medication Sig Start Date End Date Taking? Authorizing Provider  acetaminophen  (TYLENOL ) 325 MG tablet Take 650 mg by mouth as needed.    [provider]  amLODipine  (NORVASC ) 10 MG tablet Take 1 tablet (10 mg total) by mouth daily. 03/13/18   Briana Avena D, MD  aspirin  EC 81 MG tablet Take 81 mg by mouth daily. Swallow whole.    [provider]  atorvastatin  (LIPITOR) 40 MG tablet Take 1 tablet (40 mg total) by mouth daily. 03/13/18   Briana Avena D, MD  COVID-19 mRNA bivalent vaccine, Moderna, (MODERNA COVID-19 BIVAL BOOSTER) 50 MCG/0.5ML injection Inject into the muscle. 01/02/21     COVID-19 mRNA vaccine, Moderna, 100 MCG/0.5ML injection Inject into the muscle. 07/12/20   Luiz Channel, MD  diclofenac (VOLTAREN) 75 MG EC tablet Take 75 mg by mouth 2 (two) times daily.    [provider]  docusate sodium  (COLACE) 100 MG capsule Take 1 capsule (100 mg total) by mouth 2 (two) times daily. 03/13/18   Briana Avena BIRCH, MD  finasteride  (PROSCAR ) 5 MG tablet Take 1 tablet (5 mg total) by mouth daily. 03/13/18   Briana Avena BIRCH, MD  HYDROcodone -acetaminophen  (NORCO/VICODIN) 5-325 MG tablet Take 1 tablet by mouth every 6 (six) hours as needed for moderate pain (pain score 4-6).    [provider]  influenza vaccine adjuvanted (FLUAD  QUADRIVALENT) 0.5 ML injection Inject into the muscle. 12/20/20    Luiz Channel, MD  methocarbamol  (ROBAXIN ) 500 MG tablet Take 1 tablet (500 mg total) by mouth every 6 (six) hours as needed for muscle spasms. 03/13/18   Briana Avena BIRCH, MD  metoprolol  succinate (TOPROL -XL) 100 MG 24 hr tablet Take 1 tablet (100 mg total) by mouth daily. Take with or immediately following a meal. 03/13/18   Nettey, Shayla D, MD  polyethylene glycol (MIRALAX  / GLYCOLAX ) packet Take 17 g by mouth daily as needed for mild constipation. 03/13/18   Briana Avena BIRCH, MD  senna-docusate (SENOKOT-S) 8.6-50 MG tablet Take 1 tablet by mouth at bedtime as needed for mild constipation. 03/13/18   Briana Avena D, MD  tamsulosin  (FLOMAX ) 0.4 MG CAPS capsule Take 1 capsule (0.4 mg total) by mouth daily. 03/13/18   Briana Avena BIRCH, MD    Allergies: Patient has no known allergies.    Review of Systems  Updated Vital Signs BP 128/75 (BP Location: Right Arm)   Pulse 62   Temp 98 F (36.7 C) (Oral)   Resp 16   SpO2 99%   Physical Exam Vitals and nursing note reviewed.  HENT:     Head: Normocephalic.     Nose: Nose normal.     Mouth/Throat:     Mouth: Mucous membranes are moist.  Eyes:     Conjunctiva/sclera: Conjunctivae normal.  Cardiovascular:     Rate and Rhythm: Normal rate and regular rhythm.  Pulses: Normal pulses.  Pulmonary:     Effort: Pulmonary effort is normal.     Breath sounds: Normal breath sounds.  Abdominal:     General: There is no distension.     Palpations: Abdomen is soft.     Tenderness: There is no abdominal tenderness. There is no guarding or rebound.  Genitourinary:    Rectum: Guaiac result positive.     Comments: Gross hematochezia on exam Skin:    General: Skin is warm and dry.     Capillary Refill: Capillary refill takes less than 2 seconds.  Neurological:     Mental Status: He is alert and oriented to person, place, and time.  Psychiatric:        Mood and Affect: Mood normal.        Behavior: Behavior normal.     (all labs  ordered are listed, but only abnormal results are displayed) Labs Reviewed  COMPREHENSIVE METABOLIC PANEL WITH GFR - Abnormal; Notable for the following components:      Result Value   Glucose, Bld 101 (*)    BUN 25 (*)    Calcium  8.5 (*)    Total Protein 6.4 (*)    GFR, Estimated 57 (*)    All other components within normal limits  CBC - Abnormal; Notable for the following components:   RBC 4.13 (*)    Platelets 135 (*)    All other components within normal limits  POC OCCULT BLOOD, ED  TYPE AND SCREEN    EKG: None  Radiology: No results found.   Procedures   Medications Ordered in the ED - No data to display                                  Medical Decision Making Was an 89 year old man with history of CAD, hypertension hyperlipidemia prior MI and colon polyps.  He is afebrile nontachycardic, normotensive.  Overall well-appearing, having some lightheadedness, but otherwise asymptomatic with no abdominal pain.  Soft benign abdomen.  Gross hematochezia on exam.  Lab workup largely reassuring hemoglobin stable at 13.  Does have elevated BUN, but normal kidney function and no transaminitis.  He does have DNR paperwork with him, confirmed with patient that if he would like to remain DNR.  Given gross hematochezia advanced age and high propensity for decompensation will admit for further management of his GI bleed.  Reaching out to [facility] as he has had some colonoscopies done by them, most recently in 2013 however.  Polyps noted on prior colonoscopies.  Amount and/or Complexity of Data Reviewed Labs: ordered.  Risk Decision regarding hospitalization. Diagnosis or treatment significantly limited by social determinants of health. Risk Details: Poor health literacy        Final diagnoses:  Hematochezia    ED Discharge Orders     None          [name], DO 04/02/24 0940  "

## 2024-04-02 NOTE — H&P (View-Only) (Signed)
 "      Consultation  Referring Provider:  [facility]  Primary Care Physician:  [name], MD Primary Gastroenterologist: Dr. Jakie Reason for Consultation:   Hematochezia  LOS: 0 days          HPI:   Jeremy Sawyer is a 89 y.o. male with past medical history significant for CAD, hypertension, hyperlipidemia, prior MI, adenomatous colon polyps, diverticulosis, presents for evaluation of hematochezia.  Patient was in his usual state of health until 04/01/24 11 PM when he began passing profuse amounts of blood per rectum.  Had 6 episodes prior to presenting to ED this morning and has had 1 episode while in the ED.  No pain, fever, chills, nausea, vomiting.  No previous episodes of this.  Last colonoscopy in 2013 showed severe diverticulosis.  Prior history of MI over 30 years ago and remains on aspirin  therapy  Patient is DNR and has his paperwork with him per ED.  Notable labs - Hgb 13.0, HCT 39.8, MCV 96.4 - Platelets 135 - CR 1.22/BUN 25 - Fecal occult positive - Hemodynamically stable  PREVIOUS GI WORKUP   Colonoscopy 12/2011 (Dr. Jakie) - Severe diverticulosis in descending colon and sigmoid colon - two polyps 3 to 7 mm in descending colon (tubular adenoma) - Otherwise normal  Colonoscopy 12/2006 - Diverticulosis - 5 mm sessile polyp in sigmoid colon removed (tubular normal)  Colonoscopy 05/2001 with poor prep, internal hemorrhoids, diverticulosis  Past Medical History:  Diagnosis Date   Arthritis    Bruises easily    CAD (coronary artery disease)    Colon polyps    Groin pain    Right   Hypercholesterolemia    takes Lipitor daily   Hypertension    takes Amlodipine ,Ramipril ,and Metoprolol  daily   Impaired hearing    Joint pain    Joint swelling    Myocardial infarction [facility])    2003   Osteoarthritis    Right hip   Sleep apnea    uses CPAP;sleep study done > 24yrs ago    Surgical History:  He  has a past surgical history that includes Cartilage  surgery (1990); Knee surgery; Cardiac catheterization (>83yrs ago); Colonoscopy; Coronary angioplasty; Total hip arthroplasty (05/15/2011); and Total hip arthroplasty (Left, 03/09/2018). Family History:  His family history includes Colon cancer in his brother; Heart disease in his brother. Social History:   reports that he has quit smoking. His smoking use included cigarettes. He has never used smokeless tobacco. He reports current alcohol  use. He reports that he does not use drugs.  Prior to Admission medications  Medication Sig Start Date End Date Taking? Authorizing Provider  acetaminophen  (TYLENOL ) 325 MG tablet Take 650 mg by mouth as needed.    [provider]  amLODipine  (NORVASC ) 10 MG tablet Take 1 tablet (10 mg total) by mouth daily. 03/13/18   Briana Joya BIRCH, MD  aspirin  EC 81 MG tablet Take 81 mg by mouth daily. Swallow whole.    [provider]  atorvastatin  (LIPITOR) 40 MG tablet Take 1 tablet (40 mg total) by mouth daily. 03/13/18   Briana Joya D, MD  COVID-19 mRNA bivalent vaccine, Moderna, (MODERNA COVID-19 BIVAL BOOSTER) 50 MCG/0.5ML injection Inject into the muscle. 01/02/21     COVID-19 mRNA vaccine, Moderna, 100 MCG/0.5ML injection Inject into the muscle. 07/12/20   Luiz Channel, MD  diclofenac (VOLTAREN) 75 MG EC tablet Take 75 mg by mouth 2 (two) times daily.    [provider]  docusate sodium  (COLACE)  100 MG capsule Take 1 capsule (100 mg total) by mouth 2 (two) times daily. 03/13/18   Briana Joya BIRCH, MD  finasteride  (PROSCAR ) 5 MG tablet Take 1 tablet (5 mg total) by mouth daily. 03/13/18   Briana Joya BIRCH, MD  HYDROcodone -acetaminophen  (NORCO/VICODIN) 5-325 MG tablet Take 1 tablet by mouth every 6 (six) hours as needed for moderate pain (pain score 4-6).    [provider]  influenza vaccine adjuvanted (FLUAD  QUADRIVALENT) 0.5 ML injection Inject into the muscle. 12/20/20   Luiz Channel, MD  methocarbamol  (ROBAXIN ) 500 MG  tablet Take 1 tablet (500 mg total) by mouth every 6 (six) hours as needed for muscle spasms. 03/13/18   Briana Joya BIRCH, MD  metoprolol  succinate (TOPROL -XL) 100 MG 24 hr tablet Take 1 tablet (100 mg total) by mouth daily. Take with or immediately following a meal. 03/13/18   Nettey, Shayla D, MD  polyethylene glycol (MIRALAX  / GLYCOLAX ) packet Take 17 g by mouth daily as needed for mild constipation. 03/13/18   Briana Joya BIRCH, MD  senna-docusate (SENOKOT-S) 8.6-50 MG tablet Take 1 tablet by mouth at bedtime as needed for mild constipation. 03/13/18   Briana Joya BIRCH, MD  tamsulosin  (FLOMAX ) 0.4 MG CAPS capsule Take 1 capsule (0.4 mg total) by mouth daily. 03/13/18   Briana Joya D, MD    No current facility-administered medications for this encounter.   Current Outpatient Medications  Medication Sig Dispense Refill   acetaminophen  (TYLENOL ) 325 MG tablet Take 650 mg by mouth as needed.     amLODipine  (NORVASC ) 10 MG tablet Take 1 tablet (10 mg total) by mouth daily.     aspirin  EC 81 MG tablet Take 81 mg by mouth daily. Swallow whole.     atorvastatin  (LIPITOR) 40 MG tablet Take 1 tablet (40 mg total) by mouth daily.     COVID-19 mRNA bivalent vaccine, Moderna, (MODERNA COVID-19 BIVAL BOOSTER) 50 MCG/0.5ML injection Inject into the muscle. 0.5 mL 0   COVID-19 mRNA vaccine, Moderna, 100 MCG/0.5ML injection Inject into the muscle. 0.25 mL 0   diclofenac (VOLTAREN) 75 MG EC tablet Take 75 mg by mouth 2 (two) times daily.     docusate sodium  (COLACE) 100 MG capsule Take 1 capsule (100 mg total) by mouth 2 (two) times daily. 10 capsule 0   finasteride  (PROSCAR ) 5 MG tablet Take 1 tablet (5 mg total) by mouth daily.     HYDROcodone -acetaminophen  (NORCO/VICODIN) 5-325 MG tablet Take 1 tablet by mouth every 6 (six) hours as needed for moderate pain (pain score 4-6).     influenza vaccine adjuvanted (FLUAD  QUADRIVALENT) 0.5 ML injection Inject into the muscle. 0.5 mL 0   methocarbamol  (ROBAXIN )  500 MG tablet Take 1 tablet (500 mg total) by mouth every 6 (six) hours as needed for muscle spasms.     metoprolol  succinate (TOPROL -XL) 100 MG 24 hr tablet Take 1 tablet (100 mg total) by mouth daily. Take with or immediately following a meal.     polyethylene glycol (MIRALAX  / GLYCOLAX ) packet Take 17 g by mouth daily as needed for mild constipation. 14 each 0   senna-docusate (SENOKOT-S) 8.6-50 MG tablet Take 1 tablet by mouth at bedtime as needed for mild constipation.     tamsulosin  (FLOMAX ) 0.4 MG CAPS capsule Take 1 capsule (0.4 mg total) by mouth daily.      Allergies as of 04/02/2024   (No Known Allergies)    Review of Systems  Constitutional:  Negative for chills and fever.  HENT:  Negative for hearing loss and tinnitus.   Eyes:  Negative for blurred vision and double vision.  Respiratory:  Negative for cough and hemoptysis.   Cardiovascular:  Negative for chest pain and palpitations.  Gastrointestinal:  Positive for blood in stool. Negative for abdominal pain, constipation, diarrhea, heartburn, melena, nausea and vomiting.  Genitourinary:  Negative for dysuria and urgency.  Musculoskeletal:  Negative for myalgias and neck pain.  Skin:  Negative for itching and rash.  Neurological:  Negative for seizures and loss of consciousness.  Psychiatric/Behavioral:  Negative for depression and suicidal ideas.        Physical Exam:  Vital signs in last 24 hours: Temp:  [98 F (36.7 C)] 98 F (36.7 C) (01/02 0823) Pulse Rate:  [62] 62 (01/02 0823) Resp:  [16] 16 (01/02 0823) BP: (128)/(75) 128/75 (01/02 0823) SpO2:  [99 %] 99 % (01/02 0823)   Last BM recorded by nurses in past 5 days No data recorded  Physical Exam Constitutional:      Appearance: Normal appearance.  HENT:     Head: Normocephalic and atraumatic.     Nose: Nose normal. No congestion.     Mouth/Throat:     Mouth: Mucous membranes are moist.     Pharynx: Oropharynx is clear.  Eyes:     General: No scleral  icterus.    Extraocular Movements: Extraocular movements intact.  Cardiovascular:     Rate and Rhythm: Normal rate and regular rhythm.  Pulmonary:     Effort: Pulmonary effort is normal. No respiratory distress.  Abdominal:     General: Abdomen is flat. Bowel sounds are normal. There is no distension.     Palpations: Abdomen is soft. There is no mass.     Tenderness: There is no abdominal tenderness. There is no guarding or rebound.     Hernia: No hernia is present.  Musculoskeletal:        General: No swelling. Normal range of motion.     Cervical back: Normal range of motion and neck supple.  Skin:    General: Skin is warm and dry.  Neurological:     General: No focal deficit present.     Mental Status: He is alert and oriented to person, place, and time.  Psychiatric:        Mood and Affect: Mood normal.        Behavior: Behavior normal.        Thought Content: Thought content normal.        Judgment: Judgment normal.        Media Information  Document Information  Photographic Image: Photos    04/02/2024 10:51  Attached To:  Hospital Encounter on 04/02/24  Source Information  McMichael, Bayley M, PA-C  [facility]    LAB RESULTS: Recent Labs    04/02/24 0830  WBC 9.6  HGB 13.0  HCT 39.8  PLT 135*   BMET Recent Labs    04/02/24 0830  NA 141  K 4.4  CL 111  CO2 22  GLUCOSE 101*  BUN 25*  CREATININE 1.22  CALCIUM  8.5*   LFT Recent Labs    04/02/24 0830  PROT 6.4*  ALBUMIN 3.8  AST 24  ALT 24  ALKPHOS 75  BILITOT 0.7   PT/INR No results for input(s): LABPROT, INR in the last 72 hours.  STUDIES: No results found.    Impression/Plan    Painless Hematochezia (likely diverticular bleed) Colonoscopy 2013 with severe diverticulosis in  descending/sigmoid colon Hgb 13.0 Isolated elevated BUN of 25 Hemodynamically stable 7 episodes of profuse painless hematochezia since 11pm 1/1 with last episode 10:30am 1/2 in ED. See PE  for picture. Suspect diverticular bleed with last colonoscopy showing severe diverticulosis and in the absence of elevated white count, pain, or other symptoms.  - Continue daily CBC and transfuse as needed to maintain HGB > 7 - STAT CTA GI bleed with IR consult if positive -- Continue to monitor bleeding and supportive care -- patient is DNR  Mild thrombocytopenia Platelets 135 (no baseline since 2019)  CAD Prior MI > 30 years ago On ASA  Hypertension  Hyperlipidemia  Thank you for your kind consultation, we will continue to follow.   Bayley CHRISTELLA Blower  04/02/2024, 10:36 AM    Attending physician's note   I have taken a history, reviewed the chart, and examined the patient. I performed a substantive portion of this encounter, including complete performance of at least one of the key components, in conjunction with the APP. I agree with the APP's note, impression, and recommendations with my edits.   89 year old male with medical history as outlined above, to include history of HTN, HLD, CAD (on ASA 81 mg), BPH, diverticulosis, admitted with acute onset painless hematochezia.  No prior similar symptoms.  H/H on admission was 13/39.9, but downtrended to 10.9/33 this afternoon.  Had another episode of BRBPR, but subsequent CTA was negative for extravasation.  CTA did show left-sided diverticulosis.  Last colonoscopy was 12/2011 and notable for severe left-sided diverticulosis and 2 subcentimeter adenomas.  1) Hematochezia 2) Acute blood loss anemia 3) Diverticulosis Highest suspicion is for acute onset diverticular bleed, but discussed full DDx to include AVMs, ulcers, etc. with patient today. - CLD - N.p.o. at midnight - Bowel prep this evening - Colonoscopy tomorrow for diagnostic and potentially therapeutic intent - Continue serial CBC checks with blood products as needed per protocol - Hold aspirin   4) HTN 5) HLD 6) CAD - Management per primary Hospital service  The  indications, risks, and benefits of colonoscopy were explained to the patient in detail. Risks include but are not limited to bleeding, perforation, adverse reaction to medications, and cardiopulmonary compromise. Sequelae include but are not limited to the possibility of surgery, hospitalization, and mortality. The patient verbalized understanding and wished to proceed. All questions answered.   [address], DO, FACG [phone] office        "

## 2024-04-02 NOTE — Consult Note (Addendum)
 "      Consultation  Referring Provider:  [facility]  Primary Care Physician:  [name], MD Primary Gastroenterologist: Dr. Jakie Reason for Consultation:   Hematochezia  LOS: 0 days          HPI:   Jeremy Sawyer is a 89 y.o. male with past medical history significant for CAD, hypertension, hyperlipidemia, prior MI, adenomatous colon polyps, diverticulosis, presents for evaluation of hematochezia.  Patient was in his usual state of health until 04/01/24 11 PM when he began passing profuse amounts of blood per rectum.  Had 6 episodes prior to presenting to ED this morning and has had 1 episode while in the ED.  No pain, fever, chills, nausea, vomiting.  No previous episodes of this.  Last colonoscopy in 2013 showed severe diverticulosis.  Prior history of MI over 30 years ago and remains on aspirin  therapy  Patient is DNR and has his paperwork with him per ED.  Notable labs - Hgb 13.0, HCT 39.8, MCV 96.4 - Platelets 135 - CR 1.22/BUN 25 - Fecal occult positive - Hemodynamically stable  PREVIOUS GI WORKUP   Colonoscopy 12/2011 (Dr. Jakie) - Severe diverticulosis in descending colon and sigmoid colon - two polyps 3 to 7 mm in descending colon (tubular adenoma) - Otherwise normal  Colonoscopy 12/2006 - Diverticulosis - 5 mm sessile polyp in sigmoid colon removed (tubular normal)  Colonoscopy 05/2001 with poor prep, internal hemorrhoids, diverticulosis  Past Medical History:  Diagnosis Date   Arthritis    Bruises easily    CAD (coronary artery disease)    Colon polyps    Groin pain    Right   Hypercholesterolemia    takes Lipitor daily   Hypertension    takes Amlodipine ,Ramipril ,and Metoprolol  daily   Impaired hearing    Joint pain    Joint swelling    Myocardial infarction [facility])    2003   Osteoarthritis    Right hip   Sleep apnea    uses CPAP;sleep study done > 24yrs ago    Surgical History:  He  has a past surgical history that includes Cartilage  surgery (1990); Knee surgery; Cardiac catheterization (>83yrs ago); Colonoscopy; Coronary angioplasty; Total hip arthroplasty (05/15/2011); and Total hip arthroplasty (Left, 03/09/2018). Family History:  His family history includes Colon cancer in his brother; Heart disease in his brother. Social History:   reports that he has quit smoking. His smoking use included cigarettes. He has never used smokeless tobacco. He reports current alcohol  use. He reports that he does not use drugs.  Prior to Admission medications  Medication Sig Start Date End Date Taking? Authorizing Provider  acetaminophen  (TYLENOL ) 325 MG tablet Take 650 mg by mouth as needed.    [provider]  amLODipine  (NORVASC ) 10 MG tablet Take 1 tablet (10 mg total) by mouth daily. 03/13/18   Briana Joya BIRCH, MD  aspirin  EC 81 MG tablet Take 81 mg by mouth daily. Swallow whole.    [provider]  atorvastatin  (LIPITOR) 40 MG tablet Take 1 tablet (40 mg total) by mouth daily. 03/13/18   Briana Joya D, MD  COVID-19 mRNA bivalent vaccine, Moderna, (MODERNA COVID-19 BIVAL BOOSTER) 50 MCG/0.5ML injection Inject into the muscle. 01/02/21     COVID-19 mRNA vaccine, Moderna, 100 MCG/0.5ML injection Inject into the muscle. 07/12/20   Luiz Channel, MD  diclofenac (VOLTAREN) 75 MG EC tablet Take 75 mg by mouth 2 (two) times daily.    [provider]  docusate sodium  (COLACE)  100 MG capsule Take 1 capsule (100 mg total) by mouth 2 (two) times daily. 03/13/18   Briana Joya BIRCH, MD  finasteride  (PROSCAR ) 5 MG tablet Take 1 tablet (5 mg total) by mouth daily. 03/13/18   Briana Joya BIRCH, MD  HYDROcodone -acetaminophen  (NORCO/VICODIN) 5-325 MG tablet Take 1 tablet by mouth every 6 (six) hours as needed for moderate pain (pain score 4-6).    [provider]  influenza vaccine adjuvanted (FLUAD  QUADRIVALENT) 0.5 ML injection Inject into the muscle. 12/20/20   Luiz Channel, MD  methocarbamol  (ROBAXIN ) 500 MG  tablet Take 1 tablet (500 mg total) by mouth every 6 (six) hours as needed for muscle spasms. 03/13/18   Briana Joya BIRCH, MD  metoprolol  succinate (TOPROL -XL) 100 MG 24 hr tablet Take 1 tablet (100 mg total) by mouth daily. Take with or immediately following a meal. 03/13/18   Nettey, Shayla D, MD  polyethylene glycol (MIRALAX  / GLYCOLAX ) packet Take 17 g by mouth daily as needed for mild constipation. 03/13/18   Briana Joya BIRCH, MD  senna-docusate (SENOKOT-S) 8.6-50 MG tablet Take 1 tablet by mouth at bedtime as needed for mild constipation. 03/13/18   Briana Joya BIRCH, MD  tamsulosin  (FLOMAX ) 0.4 MG CAPS capsule Take 1 capsule (0.4 mg total) by mouth daily. 03/13/18   Briana Joya D, MD    No current facility-administered medications for this encounter.   Current Outpatient Medications  Medication Sig Dispense Refill   acetaminophen  (TYLENOL ) 325 MG tablet Take 650 mg by mouth as needed.     amLODipine  (NORVASC ) 10 MG tablet Take 1 tablet (10 mg total) by mouth daily.     aspirin  EC 81 MG tablet Take 81 mg by mouth daily. Swallow whole.     atorvastatin  (LIPITOR) 40 MG tablet Take 1 tablet (40 mg total) by mouth daily.     COVID-19 mRNA bivalent vaccine, Moderna, (MODERNA COVID-19 BIVAL BOOSTER) 50 MCG/0.5ML injection Inject into the muscle. 0.5 mL 0   COVID-19 mRNA vaccine, Moderna, 100 MCG/0.5ML injection Inject into the muscle. 0.25 mL 0   diclofenac (VOLTAREN) 75 MG EC tablet Take 75 mg by mouth 2 (two) times daily.     docusate sodium  (COLACE) 100 MG capsule Take 1 capsule (100 mg total) by mouth 2 (two) times daily. 10 capsule 0   finasteride  (PROSCAR ) 5 MG tablet Take 1 tablet (5 mg total) by mouth daily.     HYDROcodone -acetaminophen  (NORCO/VICODIN) 5-325 MG tablet Take 1 tablet by mouth every 6 (six) hours as needed for moderate pain (pain score 4-6).     influenza vaccine adjuvanted (FLUAD  QUADRIVALENT) 0.5 ML injection Inject into the muscle. 0.5 mL 0   methocarbamol  (ROBAXIN )  500 MG tablet Take 1 tablet (500 mg total) by mouth every 6 (six) hours as needed for muscle spasms.     metoprolol  succinate (TOPROL -XL) 100 MG 24 hr tablet Take 1 tablet (100 mg total) by mouth daily. Take with or immediately following a meal.     polyethylene glycol (MIRALAX  / GLYCOLAX ) packet Take 17 g by mouth daily as needed for mild constipation. 14 each 0   senna-docusate (SENOKOT-S) 8.6-50 MG tablet Take 1 tablet by mouth at bedtime as needed for mild constipation.     tamsulosin  (FLOMAX ) 0.4 MG CAPS capsule Take 1 capsule (0.4 mg total) by mouth daily.      Allergies as of 04/02/2024   (No Known Allergies)    Review of Systems  Constitutional:  Negative for chills and fever.  HENT:  Negative for hearing loss and tinnitus.   Eyes:  Negative for blurred vision and double vision.  Respiratory:  Negative for cough and hemoptysis.   Cardiovascular:  Negative for chest pain and palpitations.  Gastrointestinal:  Positive for blood in stool. Negative for abdominal pain, constipation, diarrhea, heartburn, melena, nausea and vomiting.  Genitourinary:  Negative for dysuria and urgency.  Musculoskeletal:  Negative for myalgias and neck pain.  Skin:  Negative for itching and rash.  Neurological:  Negative for seizures and loss of consciousness.  Psychiatric/Behavioral:  Negative for depression and suicidal ideas.        Physical Exam:  Vital signs in last 24 hours: Temp:  [98 F (36.7 C)] 98 F (36.7 C) (01/02 0823) Pulse Rate:  [62] 62 (01/02 0823) Resp:  [16] 16 (01/02 0823) BP: (128)/(75) 128/75 (01/02 0823) SpO2:  [99 %] 99 % (01/02 0823)   Last BM recorded by nurses in past 5 days No data recorded  Physical Exam Constitutional:      Appearance: Normal appearance.  HENT:     Head: Normocephalic and atraumatic.     Nose: Nose normal. No congestion.     Mouth/Throat:     Mouth: Mucous membranes are moist.     Pharynx: Oropharynx is clear.  Eyes:     General: No scleral  icterus.    Extraocular Movements: Extraocular movements intact.  Cardiovascular:     Rate and Rhythm: Normal rate and regular rhythm.  Pulmonary:     Effort: Pulmonary effort is normal. No respiratory distress.  Abdominal:     General: Abdomen is flat. Bowel sounds are normal. There is no distension.     Palpations: Abdomen is soft. There is no mass.     Tenderness: There is no abdominal tenderness. There is no guarding or rebound.     Hernia: No hernia is present.  Musculoskeletal:        General: No swelling. Normal range of motion.     Cervical back: Normal range of motion and neck supple.  Skin:    General: Skin is warm and dry.  Neurological:     General: No focal deficit present.     Mental Status: He is alert and oriented to person, place, and time.  Psychiatric:        Mood and Affect: Mood normal.        Behavior: Behavior normal.        Thought Content: Thought content normal.        Judgment: Judgment normal.        Media Information  Document Information  Photographic Image: Photos    04/02/2024 10:51  Attached To:  Hospital Encounter on 04/02/24  Source Information  McMichael, Bayley M, PA-C  [facility]    LAB RESULTS: Recent Labs    04/02/24 0830  WBC 9.6  HGB 13.0  HCT 39.8  PLT 135*   BMET Recent Labs    04/02/24 0830  NA 141  K 4.4  CL 111  CO2 22  GLUCOSE 101*  BUN 25*  CREATININE 1.22  CALCIUM  8.5*   LFT Recent Labs    04/02/24 0830  PROT 6.4*  ALBUMIN 3.8  AST 24  ALT 24  ALKPHOS 75  BILITOT 0.7   PT/INR No results for input(s): LABPROT, INR in the last 72 hours.  STUDIES: No results found.    Impression/Plan    Painless Hematochezia (likely diverticular bleed) Colonoscopy 2013 with severe diverticulosis in  descending/sigmoid colon Hgb 13.0 Isolated elevated BUN of 25 Hemodynamically stable 7 episodes of profuse painless hematochezia since 11pm 1/1 with last episode 10:30am 1/2 in ED. See PE  for picture. Suspect diverticular bleed with last colonoscopy showing severe diverticulosis and in the absence of elevated white count, pain, or other symptoms.  - Continue daily CBC and transfuse as needed to maintain HGB > 7 - STAT CTA GI bleed with IR consult if positive -- Continue to monitor bleeding and supportive care -- patient is DNR  Mild thrombocytopenia Platelets 135 (no baseline since 2019)  CAD Prior MI > 30 years ago On ASA  Hypertension  Hyperlipidemia  Thank you for your kind consultation, we will continue to follow.   Bayley CHRISTELLA Blower  04/02/2024, 10:36 AM    Attending physician's note   I have taken a history, reviewed the chart, and examined the patient. I performed a substantive portion of this encounter, including complete performance of at least one of the key components, in conjunction with the APP. I agree with the APP's note, impression, and recommendations with my edits.   89 year old male with medical history as outlined above, to include history of HTN, HLD, CAD (on ASA 81 mg), BPH, diverticulosis, admitted with acute onset painless hematochezia.  No prior similar symptoms.  H/H on admission was 13/39.9, but downtrended to 10.9/33 this afternoon.  Had another episode of BRBPR, but subsequent CTA was negative for extravasation.  CTA did show left-sided diverticulosis.  Last colonoscopy was 12/2011 and notable for severe left-sided diverticulosis and 2 subcentimeter adenomas.  1) Hematochezia 2) Acute blood loss anemia 3) Diverticulosis Highest suspicion is for acute onset diverticular bleed, but discussed full DDx to include AVMs, ulcers, etc. with patient today. - CLD - N.p.o. at midnight - Bowel prep this evening - Colonoscopy tomorrow for diagnostic and potentially therapeutic intent - Continue serial CBC checks with blood products as needed per protocol - Hold aspirin   4) HTN 5) HLD 6) CAD - Management per primary Hospital service  The  indications, risks, and benefits of colonoscopy were explained to the patient in detail. Risks include but are not limited to bleeding, perforation, adverse reaction to medications, and cardiopulmonary compromise. Sequelae include but are not limited to the possibility of surgery, hospitalization, and mortality. The patient verbalized understanding and wished to proceed. All questions answered.   [address], DO, FACG [phone] office        "

## 2024-04-02 NOTE — ED Triage Notes (Signed)
 Pt arrives via [facility] from [facility] for bright red blood in his stool that started around 2300 last night. Pt denies blood thinner use or previous GI bleeds. No reported SOB, dizziness.

## 2024-04-02 NOTE — Plan of Care (Signed)
" °  Problem: Education: Goal: Knowledge of General Education information will improve Description: Including pain rating scale, medication(s)/side effects and non-pharmacologic comfort measures 04/02/2024 1743 by Armond Synetta SAILOR, RN Outcome: Progressing 04/02/2024 1633 by Armond Synetta SAILOR, RN Outcome: Progressing   Problem: Health Behavior/Discharge Planning: Goal: Ability to manage health-related needs will improve 04/02/2024 1743 by Armond Synetta SAILOR, RN Outcome: Progressing 04/02/2024 1633 by Armond Synetta SAILOR, RN Outcome: Progressing   Problem: Clinical Measurements: Goal: Ability to maintain clinical measurements within normal limits will improve 04/02/2024 1743 by Armond Synetta SAILOR, RN Outcome: Progressing 04/02/2024 1633 by Armond Synetta SAILOR, RN Outcome: Progressing Goal: Will remain free from infection 04/02/2024 1743 by Armond Synetta SAILOR, RN Outcome: Progressing 04/02/2024 1633 by Armond Synetta SAILOR, RN Outcome: Progressing Goal: Diagnostic test results will improve 04/02/2024 1743 by Armond Synetta SAILOR, RN Outcome: Progressing 04/02/2024 1633 by Armond Synetta SAILOR, RN Outcome: Progressing Goal: Respiratory complications will improve 04/02/2024 1743 by Armond Synetta SAILOR, RN Outcome: Progressing 04/02/2024 1633 by Armond Synetta SAILOR, RN Outcome: Progressing Goal: Cardiovascular complication will be avoided 04/02/2024 1743 by Armond Synetta SAILOR, RN Outcome: Progressing 04/02/2024 1633 by Armond Synetta SAILOR, RN Outcome: Progressing   Problem: Activity: Goal: Risk for activity intolerance will decrease 04/02/2024 1743 by Armond Synetta SAILOR, RN Outcome: Progressing 04/02/2024 1633 by Armond Synetta SAILOR, RN Outcome: Progressing   Problem: Nutrition: Goal: Adequate nutrition will be maintained 04/02/2024 1743 by Armond Synetta SAILOR, RN Outcome: Progressing 04/02/2024 1633 by Armond Synetta SAILOR, RN Outcome: Progressing   Problem: Coping: Goal: Level  of anxiety will decrease 04/02/2024 1743 by Armond Synetta SAILOR, RN Outcome: Progressing 04/02/2024 1633 by Armond Synetta SAILOR, RN Outcome: Progressing   Problem: Elimination: Goal: Will not experience complications related to bowel motility 04/02/2024 1743 by Armond Synetta SAILOR, RN Outcome: Progressing 04/02/2024 1633 by Armond Synetta SAILOR, RN Outcome: Progressing Goal: Will not experience complications related to urinary retention 04/02/2024 1743 by Armond Synetta SAILOR, RN Outcome: Progressing 04/02/2024 1633 by Armond Synetta SAILOR, RN Outcome: Progressing   Problem: Pain Managment: Goal: General experience of comfort will improve and/or be controlled 04/02/2024 1743 by Armond Synetta SAILOR, RN Outcome: Progressing 04/02/2024 1633 by Armond Synetta SAILOR, RN Outcome: Progressing   Problem: Safety: Goal: Ability to remain free from injury will improve 04/02/2024 1743 by Armond Synetta SAILOR, RN Outcome: Progressing 04/02/2024 1633 by Armond Synetta SAILOR, RN Outcome: Progressing   Problem: Skin Integrity: Goal: Risk for impaired skin integrity will decrease 04/02/2024 1743 by Armond Synetta SAILOR, RN Outcome: Progressing 04/02/2024 1633 by Armond Synetta SAILOR, RN Outcome: Progressing   "

## 2024-04-03 ENCOUNTER — Observation Stay (HOSPITAL_COMMUNITY): Admitting: Certified Registered Nurse Anesthetist

## 2024-04-03 ENCOUNTER — Encounter (HOSPITAL_COMMUNITY): Payer: Self-pay | Admitting: Internal Medicine

## 2024-04-03 ENCOUNTER — Encounter (HOSPITAL_COMMUNITY)
Admission: EM | Disposition: A | Discharge status: Skilled Nursing Facility | Payer: Self-pay | Source: Skilled Nursing Facility | Attending: Internal Medicine

## 2024-04-03 DIAGNOSIS — K641 Second degree hemorrhoids: Secondary | ICD-10-CM | POA: Diagnosis present

## 2024-04-03 DIAGNOSIS — Z79899 Other long term (current) drug therapy: Secondary | ICD-10-CM | POA: Diagnosis not present

## 2024-04-03 DIAGNOSIS — I2510 Atherosclerotic heart disease of native coronary artery without angina pectoris: Secondary | ICD-10-CM | POA: Diagnosis present

## 2024-04-03 DIAGNOSIS — Z87891 Personal history of nicotine dependence: Secondary | ICD-10-CM | POA: Diagnosis not present

## 2024-04-03 DIAGNOSIS — E7800 Pure hypercholesterolemia, unspecified: Secondary | ICD-10-CM | POA: Diagnosis present

## 2024-04-03 DIAGNOSIS — Z860101 Personal history of adenomatous and serrated colon polyps: Secondary | ICD-10-CM | POA: Diagnosis not present

## 2024-04-03 DIAGNOSIS — D62 Acute posthemorrhagic anemia: Secondary | ICD-10-CM | POA: Diagnosis present

## 2024-04-03 DIAGNOSIS — G4730 Sleep apnea, unspecified: Secondary | ICD-10-CM | POA: Diagnosis not present

## 2024-04-03 DIAGNOSIS — Z800 Family history of malignant neoplasm of digestive organs: Secondary | ICD-10-CM | POA: Diagnosis not present

## 2024-04-03 DIAGNOSIS — E875 Hyperkalemia: Secondary | ICD-10-CM | POA: Insufficient documentation

## 2024-04-03 DIAGNOSIS — N179 Acute kidney failure, unspecified: Secondary | ICD-10-CM

## 2024-04-03 DIAGNOSIS — K922 Gastrointestinal hemorrhage, unspecified: Secondary | ICD-10-CM

## 2024-04-03 DIAGNOSIS — K920 Hematemesis: Secondary | ICD-10-CM | POA: Diagnosis not present

## 2024-04-03 DIAGNOSIS — I10 Essential (primary) hypertension: Secondary | ICD-10-CM | POA: Diagnosis present

## 2024-04-03 DIAGNOSIS — Z96642 Presence of left artificial hip joint: Secondary | ICD-10-CM | POA: Diagnosis present

## 2024-04-03 DIAGNOSIS — Z955 Presence of coronary angioplasty implant and graft: Secondary | ICD-10-CM | POA: Diagnosis not present

## 2024-04-03 DIAGNOSIS — K5730 Diverticulosis of large intestine without perforation or abscess without bleeding: Secondary | ICD-10-CM | POA: Diagnosis not present

## 2024-04-03 DIAGNOSIS — Z66 Do not resuscitate: Secondary | ICD-10-CM | POA: Diagnosis present

## 2024-04-03 DIAGNOSIS — E785 Hyperlipidemia, unspecified: Secondary | ICD-10-CM

## 2024-04-03 DIAGNOSIS — Z8249 Family history of ischemic heart disease and other diseases of the circulatory system: Secondary | ICD-10-CM | POA: Diagnosis not present

## 2024-04-03 DIAGNOSIS — M170 Bilateral primary osteoarthritis of knee: Secondary | ICD-10-CM | POA: Diagnosis present

## 2024-04-03 DIAGNOSIS — Z7982 Long term (current) use of aspirin: Secondary | ICD-10-CM | POA: Diagnosis not present

## 2024-04-03 DIAGNOSIS — D696 Thrombocytopenia, unspecified: Secondary | ICD-10-CM | POA: Diagnosis present

## 2024-04-03 DIAGNOSIS — N400 Enlarged prostate without lower urinary tract symptoms: Secondary | ICD-10-CM | POA: Diagnosis present

## 2024-04-03 DIAGNOSIS — K5731 Diverticulosis of large intestine without perforation or abscess with bleeding: Secondary | ICD-10-CM | POA: Diagnosis present

## 2024-04-03 DIAGNOSIS — I252 Old myocardial infarction: Secondary | ICD-10-CM | POA: Diagnosis not present

## 2024-04-03 DIAGNOSIS — I872 Venous insufficiency (chronic) (peripheral): Secondary | ICD-10-CM | POA: Diagnosis present

## 2024-04-03 DIAGNOSIS — G8929 Other chronic pain: Secondary | ICD-10-CM | POA: Diagnosis present

## 2024-04-03 DIAGNOSIS — G4733 Obstructive sleep apnea (adult) (pediatric): Secondary | ICD-10-CM | POA: Diagnosis present

## 2024-04-03 DIAGNOSIS — K921 Melena: Secondary | ICD-10-CM | POA: Diagnosis present

## 2024-04-03 DIAGNOSIS — M25462 Effusion, left knee: Secondary | ICD-10-CM | POA: Diagnosis not present

## 2024-04-03 HISTORY — PX: COLONOSCOPY: SHX5424

## 2024-04-03 LAB — BASIC METABOLIC PANEL WITH GFR
Anion gap: 14 (ref 5–15)
Anion gap: 10 (ref 5–15)
BUN: 23 mg/dL (ref 8–23)
BUN: 22 mg/dL (ref 8–23)
CO2: 20 mmol/L — ABNORMAL LOW (ref 22–32)
CO2: 21 mmol/L — ABNORMAL LOW (ref 22–32)
Calcium: 9.1 mg/dL (ref 8.9–10.3)
Calcium: 8 mg/dL — ABNORMAL LOW (ref 8.9–10.3)
Chloride: 110 mmol/L (ref 98–111)
Chloride: 110 mmol/L (ref 98–111)
Creatinine, Ser: 1.56 mg/dL — ABNORMAL HIGH (ref 0.61–1.24)
Creatinine, Ser: 1.39 mg/dL — ABNORMAL HIGH (ref 0.61–1.24)
GFR, Estimated: 43 mL/min — ABNORMAL LOW
GFR, Estimated: 49 mL/min — ABNORMAL LOW
Glucose, Bld: 188 mg/dL — ABNORMAL HIGH (ref 70–99)
Glucose, Bld: 110 mg/dL — ABNORMAL HIGH (ref 70–99)
Potassium: 5.2 mmol/L — ABNORMAL HIGH (ref 3.5–5.1)
Potassium: 3.9 mmol/L (ref 3.5–5.1)
Sodium: 143 mmol/L (ref 135–145)
Sodium: 141 mmol/L (ref 135–145)

## 2024-04-03 LAB — CBC
HCT: 35.6 % — ABNORMAL LOW (ref 39.0–52.0)
Hemoglobin: 11.8 g/dL — ABNORMAL LOW (ref 13.0–17.0)
MCH: 31.6 pg (ref 26.0–34.0)
MCHC: 33.1 g/dL (ref 30.0–36.0)
MCV: 95.2 fL (ref 80.0–100.0)
Platelets: 159 K/uL (ref 150–400)
RBC: 3.74 MIL/uL — ABNORMAL LOW (ref 4.22–5.81)
RDW: 13.7 % (ref 11.5–15.5)
WBC: 13.2 K/uL — ABNORMAL HIGH (ref 4.0–10.5)
nRBC: 0 % (ref 0.0–0.2)

## 2024-04-03 LAB — GLUCOSE, CAPILLARY: Glucose-Capillary: 143 mg/dL — ABNORMAL HIGH (ref 70–99)

## 2024-04-03 LAB — HEMOGLOBIN AND HEMATOCRIT, BLOOD
HCT: 27.4 % — ABNORMAL LOW (ref 39.0–52.0)
HCT: 24 % — ABNORMAL LOW (ref 39.0–52.0)
Hemoglobin: 9.3 g/dL — ABNORMAL LOW (ref 13.0–17.0)
Hemoglobin: 7.9 g/dL — ABNORMAL LOW (ref 13.0–17.0)

## 2024-04-03 LAB — PREPARE RBC (CROSSMATCH)

## 2024-04-03 MED ORDER — SODIUM CHLORIDE 0.9 % IV SOLN: INTRAVENOUS | Status: DC

## 2024-04-03 MED ORDER — PROPOFOL 500 MG/50ML IV EMUL
INTRAVENOUS | Status: DC | PRN
  Administered 2024-04-03: 150 ug/kg/min via INTRAVENOUS

## 2024-04-03 MED ORDER — FINASTERIDE 5 MG PO TABS
5.0000 mg | ORAL_TABLET | Freq: Every day | ORAL | Status: DC
  Administered 2024-04-03 – 2024-04-07 (×5): 5 mg via ORAL
  Filled 2024-04-03 (×5): qty 1

## 2024-04-03 MED ORDER — PROPOFOL 10 MG/ML IV BOLUS
INTRAVENOUS | Status: DC | PRN
  Administered 2024-04-03: 30 mg via INTRAVENOUS

## 2024-04-03 MED ORDER — TAMSULOSIN HCL 0.4 MG PO CAPS
0.4000 mg | ORAL_CAPSULE | Freq: Every day | ORAL | Status: DC
  Administered 2024-04-03 – 2024-04-07 (×5): 0.4 mg via ORAL
  Filled 2024-04-03 (×5): qty 1

## 2024-04-03 MED ORDER — SODIUM CHLORIDE 0.9 % IV BOLUS
500.0000 mL | Freq: Once | INTRAVENOUS | Status: AC
  Administered 2024-04-03: 500 mL via INTRAVENOUS

## 2024-04-03 MED ORDER — SODIUM CHLORIDE 0.9% IV SOLUTION: Freq: Once | INTRAVENOUS | Status: DC

## 2024-04-03 MED ORDER — PHENYLEPHRINE 80 MCG/ML (10ML) SYRINGE FOR IV PUSH (FOR BLOOD PRESSURE SUPPORT)
PREFILLED_SYRINGE | INTRAVENOUS | Status: DC | PRN
  Administered 2024-04-03 (×2): 160 ug via INTRAVENOUS

## 2024-04-03 NOTE — TOC Initial Note (Signed)
 Transition of Care [facility]) - Initial/Assessment Note    Patient Details  Name: Jeremy Sawyer MRN: [id number] Date of Birth: 10-05-1934  [facility] of [facility]) CM/SW Contact:    Sonda Manuella Quill, RN Phone Number: 04/03/2024, 5:41 PM  Clinical Narrative:                 Jeremy Sawyer w/ pt in room; pt said he lives at [facility] [place]; he plans to return w/ family's support at d/c; his dtr Jeremy Sawyer [phone]) will provide transportation; insurance/PCP verified; he denied experiencing SDOH risks; pt does not have DME, HH services, or home oxygen; IP CM following.  Expected Discharge Plan: Home/Self Care Barriers to Discharge: Continued Medical Work up   Patient Goals and CMS Choice Patient states their goals for this hospitalization and ongoing recovery are:: home     [place] ownership interest in [facility].provided to:: Patient    Expected Discharge Plan and Services   Discharge Planning Services: CM Consult   Living arrangements for the past 2 months: Independent Living Facility                 DME Arranged: N/A DME Agency: NA       HH Arranged: NA HH Agency: NA        Prior Living Arrangements/Services Living arrangements for the past 2 months: Independent Living Facility Lives with:: Self Patient language and need for interpreter reviewed:: Yes Do you feel safe going back to the place where you live?: Yes      Need for Family Participation in Patient Care: Yes (Comment) Care giver support system in place?: Yes (comment) Current home services:  (n/a) Criminal Activity/Legal Involvement Pertinent to Current Situation/Hospitalization: No - Comment as needed  Activities of Daily Living   ADL Screening (condition at time of admission) Independently performs ADLs?: Yes (appropriate for developmental age) Is the patient deaf or have difficulty hearing?: No Does the patient have difficulty seeing, even when wearing glasses/contacts?: No Does  the patient have difficulty concentrating, remembering, or making decisions?: No  Permission Sought/Granted Permission sought to share information with : Case Manager Permission granted to share information with : Yes, Verbal Permission Granted  Share Information with NAME: Case Manager     Permission granted to share info w Relationship: Jeremy Sawyer (dtr) [phone]     Emotional Assessment Appearance:: Appears stated age Attitude/Demeanor/Rapport: Gracious Affect (typically observed): Accepting Orientation: : Oriented to Self, Oriented to Place, Oriented to  Time, Oriented to Situation Alcohol  / Substance Use: Not Applicable Psych Involvement: No (comment)  Admission diagnosis:  Hematochezia [K92.1] Patient Active Problem List   Diagnosis Date Noted   Hyperkalemia 04/03/2024   Grade II internal hemorrhoids 04/03/2024   Hematochezia 04/02/2024   Diverticulosis of colon with hemorrhage 04/02/2024   ABLA (acute blood loss anemia) 04/02/2024   OSA on CPAP 01/02/2023   Avascular necrosis of bone of hip, left (HCC) 01/02/2023   Chronic venous insufficiency 03/18/2018   BPH (benign prostatic hyperplasia) 03/11/2018   Fracture of unspecified part of neck of left femur, initial encounter for closed fracture (HCC) 03/07/2018   Osteoarthritis of right hip 05/17/2011   Anemia due to blood loss 05/17/2011   CAD (coronary artery disease) 05/09/2011   HTN (hypertension) 05/09/2011   Hypercholesterolemia 05/09/2011   Pre-op evaluation 05/09/2011   PCP:  [name], MD Pharmacy:   [facility] [phone] - [place], [place] - [address] [place] [address] Phone: [phone] Fax: [phone]     Social Drivers of Health (SDOH) Social History: SDOH Screenings   Food Insecurity: No Food Insecurity (04/03/2024)  Housing: Low Risk (04/03/2024)  Transportation Needs: No Transportation Needs (04/03/2024)  Utilities: Not At Risk  (04/03/2024)  Social Connections: Moderately Isolated (04/02/2024)  Tobacco Use: Medium Risk (04/03/2024)   SDOH Interventions: Food Insecurity Interventions: Intervention Not Indicated, Inpatient TOC Housing Interventions: Intervention Not Indicated, Inpatient TOC Transportation Interventions: Intervention Not Indicated, Inpatient TOC Utilities Interventions: Intervention Not Indicated, Inpatient TOC   Readmission Risk Interventions     No data to display

## 2024-04-03 NOTE — Plan of Care (Signed)

## 2024-04-03 NOTE — Anesthesia Preprocedure Evaluation (Addendum)
"                                    Anesthesia Evaluation  Patient identified by MRN, date of birth, ID band Patient awake    Reviewed: Allergy & Precautions, NPO status , Patient's Chart, lab work & pertinent test results, reviewed documented beta blocker date and time   History of Anesthesia Complications Negative for: history of anesthetic complications  Airway Mallampati: II  TM Distance: >3 FB Neck ROM: Full    Dental  (+) Dental Advisory Given, Implants   Pulmonary sleep apnea and Continuous Positive Airway Pressure Ventilation , former smoker   Pulmonary exam normal        Cardiovascular hypertension, Pt. on medications and Pt. on home beta blockers (-) angina + CAD, + Past MI and + Cardiac Stents  Normal cardiovascular exam     Neuro/Psych negative neurological ROS  negative psych ROS   GI/Hepatic negative GI ROS, Neg liver ROS,,,  Endo/Other  negative endocrine ROS    Renal/GU Renal InsufficiencyRenal disease     Musculoskeletal  (+) Arthritis ,    Abdominal   Peds  Hematology  (+) Blood dyscrasia, anemia   Anesthesia Other Findings   Reproductive/Obstetrics                              Anesthesia Physical Anesthesia Plan  ASA: 3  Anesthesia Plan: MAC   Post-op Pain Management: Minimal or no pain anticipated   Induction:   PONV Risk Score and Plan: 1 and Propofol  infusion and Treatment may vary due to age or medical condition  Airway Management Planned: Natural Airway and Simple Face Mask  Additional Equipment: None  Intra-op Plan:   Post-operative Plan:   Informed Consent: I have reviewed the patients History and Physical, chart, labs and discussed the procedure including the risks, benefits and alternatives for the proposed anesthesia with the patient or authorized representative who has indicated his/her understanding and acceptance.   Patient has DNR.  Discussed DNR with patient and Suspend  DNR.     Plan Discussed with: CRNA and Anesthesiologist  Anesthesia Plan Comments:          Anesthesia Quick Evaluation  "

## 2024-04-03 NOTE — Anesthesia Postprocedure Evaluation (Signed)
"   Anesthesia Post Note  Patient: Jeremy Sawyer  Procedure(s) Performed: COLONOSCOPY     Patient location during evaluation: PACU Anesthesia Type: MAC Level of consciousness: awake and alert Pain management: pain level controlled Vital Signs Assessment: post-procedure vital signs reviewed and stable Respiratory status: spontaneous breathing, nonlabored ventilation and respiratory function stable Cardiovascular status: stable and blood pressure returned to baseline Anesthetic complications: no   No notable events documented.  Last Vitals:  Vitals:   04/03/24 0956 04/03/24 1000  BP:  116/67  Pulse: 98 93  Resp: (!) 21 (!) 21  Temp:    SpO2: 98% 99%    Last Pain:  Vitals:   04/03/24 1000  TempSrc:   PainSc: 0-No pain                 Debby FORBES Like      "

## 2024-04-03 NOTE — Progress Notes (Signed)
 " PROGRESS NOTE    Jozeph Persing  FMW:[id number] DOB: 08/15/1934 DOA: 04/02/2024 PCP: [name], MD    Chief Complaint  Patient presents with   Hematochezia    Brief Narrative:  Dickie Cloe is a 89 y.o. male with medical history significant for hypertension, hyperlipidemia, CAD, BPH on daily baby aspirin  being admitted to the hospital with 12 hours of hematochezia and concern for lower GI bleed.  Patient seen by GI underwent colonoscopy 04/03/2024 with findings concerning for diverticular bleed..    Assessment & Plan:   Principal Problem:   Hematochezia Active Problems:   Hyperlipidemia   Diverticulosis of colon with hemorrhage   ABLA (acute blood loss anemia)   Hyperkalemia   Grade II internal hemorrhoids   Lower GI bleed   AKI (acute kidney injury)  #1 lower GI bleed/hematochezia/acute blood loss anemia -Patient presented with multiple episodes of painless hematochezia felt likely secondary to diverticular bleed in the setting of history of diverticulosis. - Patient noted to be hemodynamically stable on admission with no significant anemia. -CT angiogram GI bleed done was negative for active GI bleed/extravasation however did show significant left-sided diverticulosis. - Patient seen in consultation by GI and patient underwent colonoscopy 04/03/2024 which showed old blood in the entire examined colon, diverticulosis in the sigmoid colon, in the descending colon and in the transverse colon.  Nonbleeding internal hemorrhoids.  No specimens collected. - Post colonoscopy patient noted to have continued hematochezia. - Hemoglobin on admission noted at 13.0. - Post colonoscopy hemoglobin noted at 9.3. - Continue IV fluids. - Follow serial CBCs. - Transfusion threshold hemoglobin < 8. - GI following and appreciate input and recommendations.  2.  Hyperkalemia -Repeat lab with potassium at 3.9.  3.  AKI -Likely secondary to prerenal azotemia in the setting of lower  GI bleed. - Check a UA. - Continue IV fluids. - Monitor urine output. - Follow-up.  4.  Hypertension -Hold home regimen antihypertensive medication due to borderline blood pressure. - IV fluids.  5.  Hyperlipidemia -Hold statin and resume on discharge.  6.  BPH -Resume home regimen Proscar , Flomax .   DVT prophylaxis: SCDs Code Status: DNR limited Family Communication: Updated patient.  No family at bedside. Disposition: Home when clinically improved, resolution of GI bleed and stability of hemoglobin hopefully in the next 24 to 48 hours.  Status is: Inpatient The patient will require care spanning > 2 midnights and should be moved to inpatient because: Severity of illness   Consultants:  Gastroenterology: Dr. San 04/02/2024  Procedures:  Colonoscopy: 04/03/2024 per GI: Dr. San CT angiogram GI bleed 04/02/2024  Antimicrobials:  Anti-infectives (From admission, onward)    None         Subjective: Patient laying in bed.  States had another bloody bowel movement after colonoscopy.  Denies any chest pain or shortness of breath.  No abdominal pain.  States has been eating since admission and requesting a diet.  Objective: Vitals:   04/03/24 0956 04/03/24 1000 04/03/24 1010 04/03/24 1413  BP:  116/67 114/68 (!) 97/59  Pulse: 98 93 90 (!) 110  Resp: (!) 21 (!) 21 18   Temp:    98.6 F (37 C)  TempSrc:      SpO2: 98% 99% 99% 99%  Weight:      Height:        Intake/Output Summary (Last 24 hours) at 04/03/2024 1908 Last data filed at 04/03/2024 1537 Gross per 24 hour  Intake 562.5 ml  Output --  Net 562.5 ml   Filed Weights   04/02/24 1351  Weight: 84 kg    Examination:  General exam: Appears calm and comfortable  Respiratory system: Clear to auscultation. Respiratory effort normal. Cardiovascular system: S1 & S2 heard, RRR. No JVD, murmurs, rubs, gallops or clicks. No pedal edema. Gastrointestinal system: Abdomen is nondistended, soft and  nontender. No organomegaly or masses felt. Normal bowel sounds heard. Central nervous system: Alert and oriented. No focal neurological deficits. Extremities: Symmetric 5 x 5 power. Skin: No rashes, lesions or ulcers Psychiatry: Judgement and insight appear normal. Mood & affect appropriate.     Data Reviewed: I have personally reviewed following labs and imaging studies  CBC: Recent Labs  Lab 04/02/24 0830 04/02/24 1245 04/02/24 1842 04/03/24 0347 04/03/24 1324  WBC 9.6  --   --  13.2*  --   HGB 13.0 10.9* 11.5* 11.8* 9.3*  HCT 39.8 32.9* 33.8* 35.6* 27.4*  MCV 96.4  --   --  95.2  --   PLT 135*  --   --  159  --     Basic Metabolic Panel: Recent Labs  Lab 04/02/24 0830 04/03/24 0347 04/03/24 1324  NA 141 143 141  K 4.4 5.2* 3.9  CL 111 110 110  CO2 22 20* 21*  GLUCOSE 101* 188* 110*  BUN 25* 23 22  CREATININE 1.22 1.56* 1.39*  CALCIUM  8.5* 9.1 8.0*    GFR: Estimated Creatinine Clearance: 38.7 mL/min (A) (by C-G formula based on SCr of 1.39 mg/dL (H)).  Liver Function Tests: Recent Labs  Lab 04/02/24 0830  AST 24  ALT 24  ALKPHOS 75  BILITOT 0.7  PROT 6.4*  ALBUMIN 3.8    CBG: Recent Labs  Lab 04/03/24 0626  GLUCAP 143*     No results found for this or any previous visit (from the past 240 hours).       Radiology Studies: CT ANGIO GI BLEED Result Date: 04/02/2024 CLINICAL DATA:  Right red blood in stool. EXAM: CTA ABDOMEN AND PELVIS WITHOUT AND WITH CONTRAST TECHNIQUE: Multidetector CT imaging of the abdomen and pelvis was performed using the standard protocol during bolus administration of intravenous contrast. Multiplanar reconstructed images and MIPs were obtained and reviewed to evaluate the vascular anatomy. RADIATION DOSE REDUCTION: This exam was performed according to the departmental dose-optimization program which includes automated exposure control, adjustment of the mA and/or kV according to patient size and/or use of iterative  reconstruction technique. CONTRAST:  OMNIPAQUE  IOHEXOL  350 MG/ML SOLN COMPARISON:  None Available. FINDINGS: VASCULAR Aorta: Aortic atherosclerosis without aneurysm or dissection. Celiac: Patent without evidence of aneurysm, dissection, vasculitis or significant stenosis. SMA: Patent without evidence of aneurysm, dissection, vasculitis or significant stenosis. Renals: Both renal arteries are patent without evidence of aneurysm, dissection, vasculitis, fibromuscular dysplasia or significant stenosis. IMA: Patent without evidence of aneurysm, dissection, vasculitis or significant stenosis. Inflow: Patent without evidence of aneurysm, dissection, vasculitis or significant stenosis. Proximal Outflow: Bilateral common femoral and visualized portions of the superficial and profunda femoral arteries are patent without evidence of aneurysm, dissection, vasculitis or significant stenosis. Veins: No obvious venous abnormality within the limitations of this arterial phase study. Review of the MIP images confirms the above findings. NON-VASCULAR Lower chest: No acute abnormality. Hepatobiliary: No focal liver abnormality is seen. No gallstones, gallbladder wall thickening, or biliary dilatation. Pancreas: Unremarkable. No pancreatic ductal dilatation or surrounding inflammatory changes. Spleen: Normal in size without focal abnormality. Adrenals/Urinary Tract: Adrenal glands appear normal. Bilateral renal cysts  are noted. Bilateral nephrolithiasis is noted. Severe right hydronephrosis is noted without ureteral dilatation or obstructing calculus, suggesting chronic UPJ obstruction. Urinary bladder is unremarkable. Stomach/Bowel: Stomach is within normal limits. Appendix appears normal. No evidence of bowel wall thickening, distention, or inflammatory changes. Diverticulosis of descending and sigmoid colon without inflammation. No definite evidence active contrast extravasation to suggest gastrointestinal bleeding.  Lymphatic: No significant adenopathy is noted. Reproductive: Prostate is unremarkable. Other: No abdominal wall hernia or abnormality. No abdominopelvic ascites. Musculoskeletal: Status post bilateral hip arthroplasties. No acute osseous abnormality is noted. IMPRESSION: 1. Aortic atherosclerosis without aneurysm or dissection. 2. No definite evidence of active gastrointestinal bleeding. 3. Diverticulosis of descending and sigmoid colon without inflammation. 4. Bilateral nephrolithiasis. 5. Severe right hydronephrosis is noted without ureteral dilatation or obstructing calculus, suggesting chronic UPJ obstruction. Aortic Atherosclerosis (ICD10-I70.0). Electronically Signed   By: Lynwood Landy Raddle M.D.   On: 04/02/2024 12:33        Scheduled Meds:  finasteride   5 mg Oral Daily   tamsulosin   0.4 mg Oral Daily   Continuous Infusions:  sodium chloride  125 mL/hr at 04/03/24 1537     LOS: 0 days    Time spent: 40 minutes    Toribio Hummer, MD [facility]   To contact the attending provider between 7A-7P or the covering provider during after hours 7P-7A, please log into the web site [email or web address] and access using universal [place] password for that web site. If you do not have the password, please call the hospital operator.  04/03/2024, 7:08 PM    "

## 2024-04-03 NOTE — Care Management Obs Status (Signed)
 MEDICARE OBSERVATION STATUS NOTIFICATION   Patient Details  Name: Gergory Biello MRN: [id number] Date of Birth: December 31, 1934   Medicare Observation Status Notification Given:  Yes    Sonda Manuella Quill, RN 04/03/2024, 4:04 PM

## 2024-04-03 NOTE — Transfer of Care (Signed)
 Immediate Anesthesia Transfer of Care Note  Patient: Jeremy Sawyer  Procedure(s) Performed: COLONOSCOPY  Patient Location: Endoscopy Unit  Anesthesia Type:MAC  Level of Consciousness: awake  Airway & Oxygen Therapy: Patient Spontanous Breathing and Patient connected to nasal cannula oxygen  Post-op Assessment: Report given to RN and Post -op Vital signs reviewed and stable  Post vital signs: Reviewed and stable  Last Vitals:  Vitals Value Taken Time  BP    Temp    Pulse    Resp    SpO2      Last Pain:  Vitals:   04/03/24 0900  TempSrc:   PainSc: 0-No pain      Patients Stated Pain Goal: 0 (04/03/24 0821)  Complications: No notable events documented.

## 2024-04-03 NOTE — Op Note (Signed)
 [facility] Patient Name: Jeremy Sawyer Procedure Date: 04/03/2024 MRN: [id number] Attending MD: Sandor Flatter , MD, [id number] Date of Birth: May 31, 1934 CSN: [id number] Age: 89 Admit Type: Inpatient Procedure:                Colonoscopy Indications:              Hematochezia, Acute post hemorrhagic anemia Providers:                Sandor Flatter, MD, Collene Edu, RN, Haskel Chris, Technician Referring MD:              Medicines:                Monitored Anesthesia Care Complications:            No immediate complications. Estimated Blood Loss:     Estimated blood loss: none. Procedure:                Pre-Anesthesia Assessment:                           - Prior to the procedure, a History and Physical                            was performed, and patient medications and                            allergies were reviewed. The patient's tolerance of                            previous anesthesia was also reviewed. The risks                            and benefits of the procedure and the sedation                            options and risks were discussed with the patient.                            All questions were answered, and informed consent                            was obtained. Prior Anticoagulants: The patient has                            taken no anticoagulant or antiplatelet agents                            except for aspirin . ASA Grade Assessment: III - A                            patient with severe systemic disease. After  reviewing the risks and benefits, the patient was                            deemed in satisfactory condition to undergo the                            procedure.                           After obtaining informed consent, the colonoscope                            was passed under direct vision. Throughout the                            procedure, the patient's blood  pressure, pulse, and                            oxygen saturations were monitored continuously. The                            CF-HQ190L ([id number]) Olympus colonoscope was                            introduced through the anus and advanced to the the                            cecum, identified by appendiceal orifice and                            ileocecal valve. The colonoscopy was performed                            without difficulty. The patient tolerated the                            procedure well. The quality of the bowel                            preparation was good. The ileocecal valve,                            appendiceal orifice, and rectum were photographed. Scope In: 9:18:23 AM Scope Out: 9:38:27 AM Scope Withdrawal Time: 0 hours 16 minutes 43 seconds  Total Procedure Duration: 0 hours 20 minutes 4 seconds  Findings:      The perianal and digital rectal examinations were normal.      Clotted and marroon blood was found scattered throughout the colon. This       was most pronounced in the left colon. Lavage of the area was performed       using copious amounts of sterile water, resulting in clearance with       excellent visualization.      Multiple small-mouthed diverticula were found in the sigmoid colon,       descending colon and transverse colon. Diverticulosis was most  dense in       the sigmoid colon. Each of the diverticula were lavaged using copious       amounts of sterile water, resulting in clearance with excellent       visualization. No active bleeding or high-grade stigmata of bleeding       were found. Made several passes through the left colon. No recurrence of       active bleed noted in the colon was essentially lavaged to clear.      Non-bleeding internal hemorrhoids were found during retroflexion. The       hemorrhoids were small and Grade II (internal hemorrhoids that prolapse       but reduce spontaneously). Impression:               - Old blood  in the entire examined colon.                           - Diverticulosis in the sigmoid colon, in the                            descending colon and in the transverse colon.                           - Non-bleeding internal hemorrhoids.                           - No specimens collected.                           Based on clinical presentation and colonoscopy                            findings, all consistent with self-limiting                            diverticular bleed. Suspect left-sided diverticular                            bleed based on burden of diverticulosis and exam                            findings. The entire colon was lavaged to clear                            without recurrence of active bleeding and no                            high-grade stigmata of bleed. Made several passes                            and no bleeding source identified. Moderate Sedation:      Not Applicable - Patient had care per Anesthesia. Recommendation:           - Return patient to hospital ward for ongoing care.                           -  Full liquid diet. If no further bleeding, can                            advance as tolerated.                           - Repeat CBC check early afternoon.                           - Repeat colonoscopy PRN.                           - Return to GI office PRN.                           - Repeat CBC check 7-10 days after hospital                            discharge Procedure Code(s):        --- Professional ---                           [phone], Colonoscopy, flexible; diagnostic, including                            collection of specimen(s) by brushing or washing,                            when performed (separate procedure) Diagnosis Code(s):        --- Professional ---                           K92.2, Gastrointestinal hemorrhage, unspecified                           K64.1, Second degree hemorrhoids                           K92.1, Melena (includes  Hematochezia)                           D62, Acute posthemorrhagic anemia                           K57.30, Diverticulosis of large intestine without                            perforation or abscess without bleeding CPT copyright 2022 American Medical Association. All rights reserved. The codes documented in this report are preliminary and upon coder review may  be revised to meet current compliance requirements. Anayla Giannetti, MD 04/03/2024 9:51:54 AM Number of Addenda: 0

## 2024-04-03 NOTE — Progress Notes (Signed)
" °   04/03/24 2232  BiPAP/CPAP/SIPAP  $ Non-Invasive Home Ventilator  Initial  BiPAP/CPAP/SIPAP Pt Type Adult  BiPAP/CPAP/SIPAP Resmed  Mask Type Nasal mask  Dentures removed? Not applicable  Mask Size Medium  Flow Rate 3 lpm  Patient Home Machine No  Patient Home Mask No  Patient Home Tubing No  Auto Titrate Yes  Minimum cmH2O 6 cmH2O  Maximum cmH2O 16 cmH2O  Device Plugged into RED Power Outlet Yes  BiPAP/CPAP /SiPAP Vitals  Pulse Rate 90  Resp 16  SpO2 100 %  Bilateral Breath Sounds Clear  MEWS Score/Color  MEWS Score 0  MEWS Score Color Green    "

## 2024-04-03 NOTE — Interval H&P Note (Signed)
 History and Physical Interval Note:  Had 1 episode of nonbloody emesis with first dose of bowel prep.  Tolerated remainder of the prep without issue.  Had bloody stools with prep overnight, but otherwise remained hemodynamically stable.  H/H stable 11.8/35.6.  Developed mild AKI with creatinine 1.56.  Plan to treat with IV fluids during procedure today.  Plan to proceed with colonoscopy for diagnostic and therapeutic intent.  04/03/2024 8:50 AM  Jeremy Sawyer  has presented today for surgery, with the diagnosis of rectal bleeding.  The various methods of treatment have been discussed with the patient and family. After consideration of risks, benefits and other options for treatment, the patient has consented to  Procedures: COLONOSCOPY (N/A) as a surgical intervention.  The patient's history has been reviewed, patient examined, no change in status, stable for surgery.  I have reviewed the patient's chart and labs.  Questions were answered to the patient's satisfaction.     Jeremy Sawyer

## 2024-04-03 NOTE — Progress Notes (Signed)
 Patient has had 2 bright red bloody stools since he came back from his procedure.  He ambulated to the bathroom, and is currently sitting in the recliner waiting for his lunch. He has no complaints of pain, dizziness,nausea/vomiting at this time. Plan of care is ongoing.

## 2024-04-03 NOTE — Anesthesia Procedure Notes (Signed)
 Procedure Name: MAC Date/Time: 04/03/2024 9:14 AM  Performed by: Judythe Tanda Aran, CRNAPre-anesthesia Checklist: Patient identified, Emergency Drugs available, Suction available and Patient being monitored Patient Re-evaluated:Patient Re-evaluated prior to induction Oxygen Delivery Method: Simple face mask

## 2024-04-03 NOTE — Progress Notes (Signed)
 Patient undergoing bowel prep for scheduled EGD. Patient became nauseous during initial prep and was unable to tolerate full amount, with emesis noted after partial intake. Zofran  4 mg IV administered with improvement in nausea. NP Erminio Cone and GI MD [place] notified at [phone] regarding patients nausea and inability to complete prep; no new orders given. Second bowel prep started at 0200. Patient encouraged to drink slowly. Patient continues to have runny, bloody stools. Patient resting in bed at this time. Plan of care ongoing.

## 2024-04-04 ENCOUNTER — Encounter (HOSPITAL_COMMUNITY): Payer: Self-pay | Admitting: Gastroenterology

## 2024-04-04 DIAGNOSIS — D62 Acute posthemorrhagic anemia: Secondary | ICD-10-CM | POA: Diagnosis not present

## 2024-04-04 DIAGNOSIS — N400 Enlarged prostate without lower urinary tract symptoms: Secondary | ICD-10-CM | POA: Diagnosis not present

## 2024-04-04 DIAGNOSIS — K921 Melena: Secondary | ICD-10-CM | POA: Diagnosis not present

## 2024-04-04 DIAGNOSIS — I10 Essential (primary) hypertension: Secondary | ICD-10-CM | POA: Diagnosis not present

## 2024-04-04 DIAGNOSIS — E785 Hyperlipidemia, unspecified: Secondary | ICD-10-CM | POA: Diagnosis not present

## 2024-04-04 DIAGNOSIS — E875 Hyperkalemia: Secondary | ICD-10-CM | POA: Diagnosis not present

## 2024-04-04 DIAGNOSIS — N179 Acute kidney failure, unspecified: Secondary | ICD-10-CM | POA: Diagnosis not present

## 2024-04-04 DIAGNOSIS — K5730 Diverticulosis of large intestine without perforation or abscess without bleeding: Secondary | ICD-10-CM

## 2024-04-04 LAB — URINALYSIS, COMPLETE (UACMP) WITH MICROSCOPIC
Bacteria, UA: NONE SEEN
Bilirubin Urine: NEGATIVE
Glucose, UA: NEGATIVE mg/dL
Hgb urine dipstick: NEGATIVE
Ketones, ur: NEGATIVE mg/dL
Leukocytes,Ua: NEGATIVE
Nitrite: NEGATIVE
Protein, ur: NEGATIVE mg/dL
Specific Gravity, Urine: 1.018 (ref 1.005–1.030)
pH: 5 (ref 5.0–8.0)

## 2024-04-04 LAB — CBC WITH DIFFERENTIAL/PLATELET
Abs Immature Granulocytes: 0.04 K/uL (ref 0.00–0.07)
Basophils Absolute: 0 K/uL (ref 0.0–0.1)
Basophils Relative: 0 %
Eosinophils Absolute: 0 K/uL (ref 0.0–0.5)
Eosinophils Relative: 0 %
HCT: 25.8 % — ABNORMAL LOW (ref 39.0–52.0)
Hemoglobin: 8.7 g/dL — ABNORMAL LOW (ref 13.0–17.0)
Immature Granulocytes: 0 %
Lymphocytes Relative: 10 %
Lymphs Abs: 1 K/uL (ref 0.7–4.0)
MCH: 31 pg (ref 26.0–34.0)
MCHC: 33.7 g/dL (ref 30.0–36.0)
MCV: 91.8 fL (ref 80.0–100.0)
Monocytes Absolute: 0.7 K/uL (ref 0.1–1.0)
Monocytes Relative: 7 %
Neutro Abs: 8.2 K/uL — ABNORMAL HIGH (ref 1.7–7.7)
Neutrophils Relative %: 82 %
Platelets: 92 K/uL — ABNORMAL LOW (ref 150–400)
RBC: 2.81 MIL/uL — ABNORMAL LOW (ref 4.22–5.81)
RDW: 16.1 % — ABNORMAL HIGH (ref 11.5–15.5)
WBC: 10 K/uL (ref 4.0–10.5)
nRBC: 0 % (ref 0.0–0.2)
nRBC: 0 /100{WBCs}

## 2024-04-04 LAB — BASIC METABOLIC PANEL WITH GFR
Anion gap: 8 (ref 5–15)
BUN: 20 mg/dL (ref 8–23)
CO2: 23 mmol/L (ref 22–32)
Calcium: 7.9 mg/dL — ABNORMAL LOW (ref 8.9–10.3)
Chloride: 114 mmol/L — ABNORMAL HIGH (ref 98–111)
Creatinine, Ser: 1.35 mg/dL — ABNORMAL HIGH (ref 0.61–1.24)
GFR, Estimated: 51 mL/min — ABNORMAL LOW
Glucose, Bld: 108 mg/dL — ABNORMAL HIGH (ref 70–99)
Potassium: 4 mmol/L (ref 3.5–5.1)
Sodium: 144 mmol/L (ref 135–145)

## 2024-04-04 LAB — CBC
HCT: 23.8 % — ABNORMAL LOW (ref 39.0–52.0)
Hemoglobin: 8.1 g/dL — ABNORMAL LOW (ref 13.0–17.0)
MCH: 31.2 pg (ref 26.0–34.0)
MCHC: 34 g/dL (ref 30.0–36.0)
MCV: 91.5 fL (ref 80.0–100.0)
Platelets: 90 K/uL — ABNORMAL LOW (ref 150–400)
RBC: 2.6 MIL/uL — ABNORMAL LOW (ref 4.22–5.81)
RDW: 16.2 % — ABNORMAL HIGH (ref 11.5–15.5)
WBC: 7.5 K/uL (ref 4.0–10.5)
nRBC: 0 % (ref 0.0–0.2)

## 2024-04-04 LAB — MAGNESIUM: Magnesium: 2.3 mg/dL (ref 1.7–2.4)

## 2024-04-04 LAB — SODIUM, URINE, RANDOM: Sodium, Ur: 46 mmol/L

## 2024-04-04 NOTE — Progress Notes (Signed)
 [facility] ROUNDING NOTE   Subjective: Colonoscopy completed yesterday and notable for diverticulosis and blood in the colon lumen, but no active bleeding or clear stigmata of recent bleed noted.  Repeat hemoglobin 7.9 and transfused 2 units last evening with posttransfusion H/H 8.7/25.8 today.  No further bleeding overnight and no BM yet today.  Tolerating liquids without issue and asking to advance diet.   Objective: Vital signs in last 24 hours: Temp:  [97.8 F (36.6 C)-98.9 F (37.2 C)] 98.4 F (36.9 C) (01/04 0357) Pulse Rate:  [80-110] 80 (01/04 0357) Resp:  [14-21] 20 (01/04 0357) BP: (97-133)/(56-98) 133/64 (01/04 0357) SpO2:  [96 %-100 %] 99 % (01/04 0357) Last BM Date : 04/03/24 General: NAD Lungs:  CTA b/l, no w/r/r Heart:  RRR, no m/r/g Abdomen: Small ventral hernia.  Soft, NT, ND, +BS    Intake/Output from previous day: 01/03 0701 - 01/04 0700 In: 2343.8 [I.V.:812.5; Blood:1531.3] Out: -  Intake/Output this shift: Total I/O In: -  Out: 300 [Urine:300]   Lab Results: Recent Labs    04/02/24 0830 04/02/24 1245 04/03/24 0347 04/03/24 1324 04/03/24 2029 04/04/24 0711  WBC 9.6  --  13.2*  --   --  10.0  HGB 13.0   < > 11.8* 9.3* 7.9* 8.7*  PLT 135*  --  159  --   --  92*  MCV 96.4  --  95.2  --   --  91.8   < > = values in this interval not displayed.   BMET Recent Labs    04/03/24 0347 04/03/24 1324 04/04/24 0711  NA 143 141 144  K 5.2* 3.9 4.0  CL 110 110 114*  CO2 20* 21* 23  GLUCOSE 188* 110* 108*  BUN 23 22 20   CREATININE 1.56* 1.39* 1.35*  CALCIUM  9.1 8.0* 7.9*   LFT Recent Labs    04/02/24 0830  PROT 6.4*  ALBUMIN 3.8  AST 24  ALT 24  ALKPHOS 75  BILITOT 0.7   PT/INR No results for input(s): INR in the last 72 hours.    Imaging/Other results: CT ANGIO GI BLEED Result Date: 04/02/2024 CLINICAL DATA:  Right red blood in stool. EXAM: CTA ABDOMEN AND PELVIS WITHOUT AND WITH CONTRAST TECHNIQUE: Multidetector  CT imaging of the abdomen and pelvis was performed using the standard protocol during bolus administration of intravenous contrast. Multiplanar reconstructed images and MIPs were obtained and reviewed to evaluate the vascular anatomy. RADIATION DOSE REDUCTION: This exam was performed according to the departmental dose-optimization program which includes automated exposure control, adjustment of the mA and/or kV according to patient size and/or use of iterative reconstruction technique. CONTRAST:  OMNIPAQUE  IOHEXOL  350 MG/ML SOLN COMPARISON:  None Available. FINDINGS: VASCULAR Aorta: Aortic atherosclerosis without aneurysm or dissection. Celiac: Patent without evidence of aneurysm, dissection, vasculitis or significant stenosis. SMA: Patent without evidence of aneurysm, dissection, vasculitis or significant stenosis. Renals: Both renal arteries are patent without evidence of aneurysm, dissection, vasculitis, fibromuscular dysplasia or significant stenosis. IMA: Patent without evidence of aneurysm, dissection, vasculitis or significant stenosis. Inflow: Patent without evidence of aneurysm, dissection, vasculitis or significant stenosis. Proximal Outflow: Bilateral common femoral and visualized portions of the superficial and profunda femoral arteries are patent without evidence of aneurysm, dissection, vasculitis or significant stenosis. Veins: No obvious venous abnormality within the limitations of this arterial phase study. Review of the MIP images confirms the above findings. NON-VASCULAR Lower chest: No acute abnormality. Hepatobiliary: No focal liver abnormality is seen. No gallstones, gallbladder wall  thickening, or biliary dilatation. Pancreas: Unremarkable. No pancreatic ductal dilatation or surrounding inflammatory changes. Spleen: Normal in size without focal abnormality. Adrenals/Urinary Tract: Adrenal glands appear normal. Bilateral renal cysts are noted. Bilateral nephrolithiasis is noted. Severe  right hydronephrosis is noted without ureteral dilatation or obstructing calculus, suggesting chronic UPJ obstruction. Urinary bladder is unremarkable. Stomach/Bowel: Stomach is within normal limits. Appendix appears normal. No evidence of bowel wall thickening, distention, or inflammatory changes. Diverticulosis of descending and sigmoid colon without inflammation. No definite evidence active contrast extravasation to suggest gastrointestinal bleeding. Lymphatic: No significant adenopathy is noted. Reproductive: Prostate is unremarkable. Other: No abdominal wall hernia or abnormality. No abdominopelvic ascites. Musculoskeletal: Status post bilateral hip arthroplasties. No acute osseous abnormality is noted. IMPRESSION: 1. Aortic atherosclerosis without aneurysm or dissection. 2. No definite evidence of active gastrointestinal bleeding. 3. Diverticulosis of descending and sigmoid colon without inflammation. 4. Bilateral nephrolithiasis. 5. Severe right hydronephrosis is noted without ureteral dilatation or obstructing calculus, suggesting chronic UPJ obstruction. Aortic Atherosclerosis (ICD10-I70.0). Electronically Signed   By: Lynwood Landy Raddle M.D.   On: 04/02/2024 12:33      Assessment and Plan:  1) Diverticulosis 2) Acute blood loss anemia 3) Hematochezia  89 year old male admitted with new onset painless hematochezia.  H/H on admission was 13/39.9, but downtrended to nadir of 7.9/24.  CTA negative for bleeding.  Transfused 2 units RBCs on 04/03/2024.  Colonoscopy in 04/03/2024 with diverticulosis in the sigmoid, descending, transverse colon and old blood in the colon lumen without active bleed or high-grade stigmata of recent bleed.  Clinical presentation all consistent with self-limiting diverticular bleed that appears to have stopped now. - Discussed pathophysiology and natural course of diverticulosis with patient today.  Discussed possibility and percentages of rebleed - Serial CBC checks while in  house - After discharge, recommend repeat CBC 7-10 days after to ensure H/H returning to baseline.  This can be checked with his PCP - Ok to advance diet as tolerated - Inpatient GI service will sign off at this time.  Please do not hesitate to contact us  with additional questions or concerns   Sandor LULLA Flatter, DO  04/04/2024, 8:34 AM [facility] Pager [phone]  A total of 50 minutes of time was spent on this encounter, including in depth chart review, independent review of results as outlined above, communicating results with the patient directly, face-to-face time with the patient, coordinating care, and ordering studies and medications as appropriate, and documentation.

## 2024-04-04 NOTE — Progress Notes (Signed)
 " PROGRESS NOTE    Jeremy Sawyer  FMW:[id number] DOB: 09/03/1934 DOA: 04/02/2024 PCP: [name], MD    Chief Complaint  Patient presents with   Hematochezia    Brief Narrative:  Jeremy Sawyer is a 89 y.o. male with medical history significant for hypertension, hyperlipidemia, CAD, BPH on daily baby aspirin  being admitted to the hospital with 12 hours of hematochezia and concern for lower GI bleed.  Patient seen by GI underwent colonoscopy 04/03/2024 with findings concerning for diverticular bleed..    Assessment & Plan:   Principal Problem:   Hematochezia Active Problems:   Hyperlipidemia   Diverticulosis of colon with hemorrhage   ABLA (acute blood loss anemia)   Hyperkalemia   Grade II internal hemorrhoids   Lower GI bleed   AKI (acute kidney injury)  #1 lower GI bleed/hematochezia/acute blood loss anemia -Patient presented with multiple episodes of painless hematochezia felt likely secondary to diverticular bleed in the setting of history of diverticulosis. - Patient noted to be hemodynamically stable on admission with no significant anemia. -CT angiogram GI bleed done was negative for active GI bleed/extravasation however did show significant left-sided diverticulosis. - Patient seen in consultation by GI and patient underwent colonoscopy 04/03/2024 which showed old blood in the entire examined colon, diverticulosis in the sigmoid colon, in the descending colon and in the transverse colon.  Nonbleeding internal hemorrhoids.  No specimens collected. - Post colonoscopy patient noted to have continued hematochezia on 04/03/2024.. - Hemoglobin on admission noted at 13.0. - Post colonoscopy hemoglobin dropped as low as 7.9 and patient transfused 2 units PRBCs overnight with posttransfusion hemoglobin at 8.7.   -Repeat hemoglobin on 8.1 this afternoon. - Saline lock IV fluids.   -Repeat CBC in the AM. - Transfusion threshold hemoglobin < 8. -Diet being advanced to a  soft diet/low fiber diet. - GI following and recommending postdischarge CBC 7 to 10 days per PCP.   - GI has signed off as of today, 04/04/2024.   2.  Hyperkalemia -Repeat lab with potassium at 3.9.  3.  AKI -Likely secondary to prerenal azotemia in the setting of lower GI bleed. - Urinalysis unremarkable.   - Improved with gentle hydration. - Follow.  4.  Hypertension - Continue to hold home regimen of antihypertensive medications.   - Saline lock IV fluids.  5.  Hyperlipidemia - Continue to hold statin and resume on discharge.   6.  BPH - Continue Proscar , Flomax .   DVT prophylaxis: SCDs Code Status: DNR limited Family Communication: Updated patient.  No family at bedside. Disposition: Back to [facility] when clinically improved, resolution of GI bleed and stability of hemoglobin hopefully in the next 24 to 48 hours.  Status is: Inpatient The patient will require care spanning > 2 midnights and should be moved to inpatient because: Severity of illness   Consultants:  Gastroenterology: Dr. San 04/02/2024  Procedures:  Colonoscopy: 04/03/2024 per GI: Dr. San CT angiogram GI bleed 04/02/2024 Transfusion 2 units PRBCs  Antimicrobials:  Anti-infectives (From admission, onward)    None         Subjective: Patient laying in bed.  Denies any chest pain or shortness of breath.  No abdominal pain.  States improvement with fatigue.  Has not had a bowel movement today.  Tolerating current diet.  Asking to be assessed to see whether he might need to go to SNF versus back to his independent living at [facility].   Objective: Vitals:   04/04/24 0123 04/04/24 0202 04/04/24  0357 04/04/24 1310  BP: (!) 111/98 (!) 111/56 133/64 (!) 122/57  Pulse: 88 85 80 87  Resp: 20 20 20 15   Temp: 98.4 F (36.9 C) 98.6 F (37 C) 98.4 F (36.9 C) 97.8 F (36.6 C)  TempSrc: Axillary     SpO2: 100% 100% 99% 100%  Weight:      Height:        Intake/Output Summary (Last 24 hours)  at 04/04/2024 1339 Last data filed at 04/04/2024 0738 Gross per 24 hour  Intake 2043.77 ml  Output 300 ml  Net 1743.77 ml   Filed Weights   04/02/24 1351  Weight: 84 kg    Examination:  General exam: NAD Respiratory system: CTAB.  No wheezes, no crackles, no rhonchi.  Fair air movement.  Speaking full sentences.  Cardiovascular system: Regular rate rhythm no murmurs rubs or gallops.  No JVD.  No lower extremity edema.   Gastrointestinal system: Abdomen is soft, nontender, nondistended, positive bowel sounds.  No rebound.  No guarding.  Central nervous system: Alert and oriented.  Moving extremities spontaneously.  No focal neurological deficits. Extremities: Symmetric 5 x 5 power. Skin: No rashes, lesions or ulcers Psychiatry: Judgement and insight appear normal. Mood & affect appropriate.     Data Reviewed: I have personally reviewed following labs and imaging studies  CBC: Recent Labs  Lab 04/02/24 0830 04/02/24 1245 04/02/24 1842 04/03/24 0347 04/03/24 1324 04/03/24 2029 04/04/24 0711  WBC 9.6  --   --  13.2*  --   --  10.0  NEUTROABS  --   --   --   --   --   --  8.2*  HGB 13.0   < > 11.5* 11.8* 9.3* 7.9* 8.7*  HCT 39.8   < > 33.8* 35.6* 27.4* 24.0* 25.8*  MCV 96.4  --   --  95.2  --   --  91.8  PLT 135*  --   --  159  --   --  92*   < > = values in this interval not displayed.    Basic Metabolic Panel: Recent Labs  Lab 04/02/24 0830 04/03/24 0347 04/03/24 1324 04/04/24 0711  NA 141 143 141 144  K 4.4 5.2* 3.9 4.0  CL 111 110 110 114*  CO2 22 20* 21* 23  GLUCOSE 101* 188* 110* 108*  BUN 25* 23 22 20   CREATININE 1.22 1.56* 1.39* 1.35*  CALCIUM  8.5* 9.1 8.0* 7.9*  MG  --   --   --  2.3    GFR: Estimated Creatinine Clearance: 39.8 mL/min (A) (by C-G formula based on SCr of 1.35 mg/dL (H)).  Liver Function Tests: Recent Labs  Lab 04/02/24 0830  AST 24  ALT 24  ALKPHOS 75  BILITOT 0.7  PROT 6.4*  ALBUMIN 3.8    CBG: Recent Labs  Lab  04/03/24 0626  GLUCAP 143*     No results found for this or any previous visit (from the past 240 hours).       Radiology Studies: No results found.       Scheduled Meds:  sodium chloride    Intravenous Once   finasteride   5 mg Oral Daily   tamsulosin   0.4 mg Oral Daily   Continuous Infusions:  sodium chloride  125 mL/hr at 04/03/24 1537     LOS: 1 day    Time spent: 40 minutes    Toribio Hummer, MD [facility]   To contact the attending provider between 7A-7P or the  covering provider during after hours 7P-7A, please log into the web site [email or web address] and access using universal [place] password for that web site. If you do not have the password, please call the hospital operator.  04/04/2024, 1:39 PM    "

## 2024-04-04 NOTE — Progress Notes (Signed)
 Patient alert and oriented. Sat in recliner and tolerated activity well. Patient tolerated two units of packed red blood cells without adverse reaction. Denies pain, nausea, or vomiting. On full liquid diet, tolerating well. No signs of active bleeding noted; no bloody bowel movements reported during shift. Plan of care on going.

## 2024-04-04 NOTE — Plan of Care (Incomplete)
  Problem: Health Behavior/Discharge Planning: Goal: Ability to manage health-related needs will improve Outcome: Progressing   Problem: Clinical Measurements: Goal: Ability to maintain clinical measurements within normal limits will improve Outcome: Progressing Goal: Diagnostic test results will improve Outcome: Progressing   Problem: Education: Goal: Knowledge of General Education information will improve Description: Including pain rating scale, medication(s)/side effects and non-pharmacologic comfort measures Outcome: Adequate for Discharge   Problem: Clinical Measurements: Goal: Will remain free from infection Outcome: Adequate for Discharge Goal: Respiratory complications will improve Outcome: Adequate for Discharge Goal: Cardiovascular complication will be avoided Outcome: Adequate for Discharge

## 2024-04-04 NOTE — Progress Notes (Signed)
" °   04/04/24 0045  BiPAP/CPAP/SIPAP  BiPAP/CPAP/SIPAP Pt Type Adult (patient complains of pressure being too much. Settings adjusted to comfort.)  BiPAP/CPAP/SIPAP Resmed  Mask Type Nasal mask  Flow Rate 3 lpm  Patient Home Machine No  Patient Home Mask No  Patient Home Tubing No  Auto Titrate Yes  Minimum cmH2O (S)  4 cmH2O  Maximum cmH2O (S)  10 cmH2O  CPAP/SIPAP surface wiped down Yes  Device Plugged into RED Power Outlet Yes  BiPAP/CPAP /SiPAP Vitals  Pulse Rate 96  Resp 20  SpO2 100 %  Bilateral Breath Sounds Clear  MEWS Score/Color  MEWS Score 0  MEWS Score Color Green    "

## 2024-04-05 ENCOUNTER — Inpatient Hospital Stay (HOSPITAL_COMMUNITY)

## 2024-04-05 DIAGNOSIS — N179 Acute kidney failure, unspecified: Secondary | ICD-10-CM

## 2024-04-05 DIAGNOSIS — K921 Melena: Secondary | ICD-10-CM | POA: Diagnosis not present

## 2024-04-05 DIAGNOSIS — D696 Thrombocytopenia, unspecified: Secondary | ICD-10-CM | POA: Diagnosis not present

## 2024-04-05 DIAGNOSIS — E875 Hyperkalemia: Secondary | ICD-10-CM | POA: Diagnosis not present

## 2024-04-05 DIAGNOSIS — K5731 Diverticulosis of large intestine without perforation or abscess with bleeding: Secondary | ICD-10-CM

## 2024-04-05 DIAGNOSIS — K5730 Diverticulosis of large intestine without perforation or abscess without bleeding: Secondary | ICD-10-CM | POA: Diagnosis not present

## 2024-04-05 DIAGNOSIS — E785 Hyperlipidemia, unspecified: Secondary | ICD-10-CM | POA: Diagnosis not present

## 2024-04-05 DIAGNOSIS — D62 Acute posthemorrhagic anemia: Secondary | ICD-10-CM | POA: Diagnosis not present

## 2024-04-05 LAB — CBC WITH DIFFERENTIAL/PLATELET
Abs Immature Granulocytes: 0.04 K/uL (ref 0.00–0.07)
Basophils Absolute: 0 K/uL (ref 0.0–0.1)
Basophils Relative: 1 %
Eosinophils Absolute: 0.1 K/uL (ref 0.0–0.5)
Eosinophils Relative: 2 %
HCT: 21.7 % — ABNORMAL LOW (ref 39.0–52.0)
Hemoglobin: 7.2 g/dL — ABNORMAL LOW (ref 13.0–17.0)
Immature Granulocytes: 1 %
Lymphocytes Relative: 20 %
Lymphs Abs: 1.4 K/uL (ref 0.7–4.0)
MCH: 30.6 pg (ref 26.0–34.0)
MCHC: 33.2 g/dL (ref 30.0–36.0)
MCV: 92.3 fL (ref 80.0–100.0)
Monocytes Absolute: 0.8 K/uL (ref 0.1–1.0)
Monocytes Relative: 13 %
Neutro Abs: 4.2 K/uL (ref 1.7–7.7)
Neutrophils Relative %: 63 %
Platelets: 85 K/uL — ABNORMAL LOW (ref 150–400)
RBC: 2.35 MIL/uL — ABNORMAL LOW (ref 4.22–5.81)
RDW: 16.2 % — ABNORMAL HIGH (ref 11.5–15.5)
WBC: 6.6 K/uL (ref 4.0–10.5)
nRBC: 0 % (ref 0.0–0.2)

## 2024-04-05 LAB — ECHOCARDIOGRAM COMPLETE
AR max vel: 1.8 cm2
AV Area VTI: 1.9 cm2
AV Area mean vel: 1.86 cm2
AV Mean grad: 8 mmHg
AV Peak grad: 15.4 mmHg
Ao pk vel: 1.96 m/s
Area-P 1/2: 1.46 cm2
Calc EF: 57.5 %
Height: 70 in
P 1/2 time: 731 ms
S' Lateral: 3.8 cm
Single Plane A2C EF: 59.8 %
Single Plane A4C EF: 56.8 %
Weight: 2962.98 [oz_av]

## 2024-04-05 LAB — BASIC METABOLIC PANEL WITH GFR
Anion gap: 7 (ref 5–15)
BUN: 19 mg/dL (ref 8–23)
CO2: 22 mmol/L (ref 22–32)
Calcium: 7.7 mg/dL — ABNORMAL LOW (ref 8.9–10.3)
Chloride: 112 mmol/L — ABNORMAL HIGH (ref 98–111)
Creatinine, Ser: 1.34 mg/dL — ABNORMAL HIGH (ref 0.61–1.24)
GFR, Estimated: 51 mL/min — ABNORMAL LOW
Glucose, Bld: 101 mg/dL — ABNORMAL HIGH (ref 70–99)
Potassium: 4 mmol/L (ref 3.5–5.1)
Sodium: 140 mmol/L (ref 135–145)

## 2024-04-05 LAB — CBC
HCT: 27.1 % — ABNORMAL LOW (ref 39.0–52.0)
Hemoglobin: 9.3 g/dL — ABNORMAL LOW (ref 13.0–17.0)
MCH: 30.9 pg (ref 26.0–34.0)
MCHC: 34.3 g/dL (ref 30.0–36.0)
MCV: 90 fL (ref 80.0–100.0)
Platelets: 94 K/uL — ABNORMAL LOW (ref 150–400)
RBC: 3.01 MIL/uL — ABNORMAL LOW (ref 4.22–5.81)
RDW: 15.6 % — ABNORMAL HIGH (ref 11.5–15.5)
WBC: 8.4 K/uL (ref 4.0–10.5)
nRBC: 0 % (ref 0.0–0.2)

## 2024-04-05 LAB — PREPARE RBC (CROSSMATCH)

## 2024-04-05 MED ORDER — ACETAMINOPHEN 325 MG PO TABS
650.0000 mg | ORAL_TABLET | Freq: Once | ORAL | Status: AC
  Administered 2024-04-05: 650 mg via ORAL
  Filled 2024-04-05: qty 2

## 2024-04-05 MED ORDER — FUROSEMIDE 10 MG/ML IJ SOLN
20.0000 mg | Freq: Once | INTRAMUSCULAR | Status: AC
  Administered 2024-04-05: 20 mg via INTRAVENOUS
  Filled 2024-04-05: qty 2

## 2024-04-05 MED ORDER — SODIUM CHLORIDE 0.9% IV SOLUTION: Freq: Once | INTRAVENOUS | Status: AC

## 2024-04-05 NOTE — Progress Notes (Addendum)
 " PROGRESS NOTE    Jeremy Sawyer  FMW:[id number] DOB: [birth year] DOA: 04/02/2024 PCP: [name], MD    Chief Complaint  Patient presents with   Hematochezia    Brief Narrative:  Jeremy Sawyer is a 89 y.o. male with medical history significant for hypertension, hyperlipidemia, CAD, BPH on daily baby aspirin  being admitted to the hospital with 12 hours of hematochezia and concern for lower GI bleed.  Patient seen by GI underwent colonoscopy 04/03/2024 with findings concerning for diverticular bleed..    Assessment & Plan:   Principal Problem:   Hematochezia Active Problems:   Hyperlipidemia   Diverticulosis of colon with hemorrhage   ABLA (acute blood loss anemia)   Hyperkalemia   Grade II internal hemorrhoids   Lower GI bleed   AKI (acute kidney injury)  #1 lower GI bleed likely diverticular bleed/hematochezia/acute blood loss anemia -Patient presented with multiple episodes of painless hematochezia felt likely secondary to diverticular bleed in the setting of history of diverticulosis. - Patient noted to be hemodynamically stable on admission with no significant anemia. -CT angiogram GI bleed done was negative for active GI bleed/extravasation however did show significant left-sided diverticulosis. - Patient seen in consultation by GI and patient underwent colonoscopy 04/03/2024 which showed old blood in the entire examined colon, diverticulosis in the sigmoid colon, in the descending colon and in the transverse colon.  Nonbleeding internal hemorrhoids.  No specimens collected. - Post colonoscopy patient noted to have continued hematochezia on 04/03/2024.. - Hemoglobin on admission noted at 13.0. - Post colonoscopy hemoglobin dropped as low as 7.9 and patient transfused 2 units PRBCs overnight with posttransfusion hemoglobin at 8.7.   -Repeat hemoglobin today of 7.2.   - Patient noted to have had bloody bowel movements overnight.   -Patient hemodynamically stable. -  Transfuse 2 units PRBCs.  - Follow H&H.  - Transfusion threshold hemoglobin < 8. - GI was following, recommended postdischarge CBC in 7 to 10 days by PCP.   - GI noted to have signed off on 04/04/2024 however due to ongoing bleeding and drop in hemoglobin GI reconsulted.   - Patient assessed by GI-patient has a stuttering diverticular bleed and recommend continued observation and transfusion. - Per GI patient with significant bleeding and hemodynamically unstable we will proceed with a CTA and if CTA is positive then refer to IR for embolization. - Per GI.  2.  Hyperkalemia - Resolved.   3.  AKI -Likely secondary to prerenal azotemia in the setting of lower GI bleed. - Urinalysis unremarkable.   - Improved with gentle hydration. - Follow.  4.  Hypertension - BP stable.   - Continue to hold home antihypertensive medications.   5.  Hyperlipidemia - Statin on hold, resume on discharge.   6.  BPH - Flomax , Proscar .  7.  Murmur -Check a 2D echo.   DVT prophylaxis: SCDs Code Status: DNR limited Family Communication: Updated patient.  No family at bedside. Disposition: Back to [facility] when clinically improved, resolution of GI bleed and stability of hemoglobin, and when cleared by GI.  Status is: Inpatient The patient will require care spanning > 2 midnights and should be moved to inpatient because: Severity of illness   Consultants:  Gastroenterology: Dr. San 04/02/2024  Procedures:  Colonoscopy: 04/03/2024 per GI: Dr. San CT angiogram GI bleed 04/02/2024 Transfusion 2 units PRBCs 04/03/2024-04/04/2024 Transfused 2 units PRBCs 04/05/2024  Antimicrobials:  Anti-infectives (From admission, onward)    None  Subjective: Patient sitting up in chair.  Denies any chest pain or shortness of breath.  No abdominal pain.  Stated he had a bloody bowel movement at 10 PM last night and at 2:30 AM this morning.  Per RN nobody was notified of bloody bowel movement.  RN  at bedside and recent vitals done with a blood pressure of 124/60, pulse of 86, sats of 100% on room air, temp of 98.6.  Objective: Vitals:   04/04/24 1310 04/04/24 2053 04/05/24 0542 04/05/24 0854  BP: (!) 122/57 137/66 138/75 124/60  Pulse: 87 85 78 87  Resp: 15   19  Temp: 97.8 F (36.6 C) 98.2 F (36.8 C) 98.7 F (37.1 C) 98.6 F (37 C)  TempSrc:  Oral Oral Oral  SpO2: 100% 99% 97% 100%  Weight:      Height:        Intake/Output Summary (Last 24 hours) at 04/05/2024 0857 Last data filed at 04/04/2024 1700 Gross per 24 hour  Intake 240 ml  Output 450 ml  Net -210 ml   Filed Weights   04/02/24 1351  Weight: 84 kg    Examination:  General exam: NAD Respiratory system: Lungs clear to auscultation bilaterally.  No wheezes, no crackles, no rhonchi.  Fair air movement.  Speaking in full sentences.   Cardiovascular system: RRR with 3/6 SEM.  No JVD.  No lower extremity edema.  Gastrointestinal system: Abdomen is soft, nontender, nondistended, positive bowel sounds.  No rebound.  No guarding.  Central nervous system: Alert and oriented.  Moving extremities spontaneously.  No focal neurological deficits. Extremities: Symmetric 5 x 5 power. Skin: No rashes, lesions or ulcers Psychiatry: Judgement and insight appear normal. Mood & affect appropriate.     Data Reviewed: I have personally reviewed following labs and imaging studies  CBC: Recent Labs  Lab 04/02/24 0830 04/02/24 1245 04/03/24 0347 04/03/24 1324 04/03/24 2029 04/04/24 0711 04/04/24 1441 04/05/24 0341  WBC 9.6  --  13.2*  --   --  10.0 7.5 6.6  NEUTROABS  --   --   --   --   --  8.2*  --  4.2  HGB 13.0   < > 11.8* 9.3* 7.9* 8.7* 8.1* 7.2*  HCT 39.8   < > 35.6* 27.4* 24.0* 25.8* 23.8* 21.7*  MCV 96.4  --  95.2  --   --  91.8 91.5 92.3  PLT 135*  --  159  --   --  92* 90* 85*   < > = values in this interval not displayed.    Basic Metabolic Panel: Recent Labs  Lab 04/02/24 0830 04/03/24 0347  04/03/24 1324 04/04/24 0711 04/05/24 0341  NA 141 143 141 144 140  K 4.4 5.2* 3.9 4.0 4.0  CL 111 110 110 114* 112*  CO2 22 20* 21* 23 22  GLUCOSE 101* 188* 110* 108* 101*  BUN 25* 23 22 20 19   CREATININE 1.22 1.56* 1.39* 1.35* 1.34*  CALCIUM  8.5* 9.1 8.0* 7.9* 7.7*  MG  --   --   --  2.3  --     GFR: Estimated Creatinine Clearance: 40.1 mL/min (A) (by C-G formula based on SCr of 1.34 mg/dL (H)).  Liver Function Tests: Recent Labs  Lab 04/02/24 0830  AST 24  ALT 24  ALKPHOS 75  BILITOT 0.7  PROT 6.4*  ALBUMIN 3.8    CBG: Recent Labs  Lab 04/03/24 0626  GLUCAP 143*     No results found  for this or any previous visit (from the past 240 hours).       Radiology Studies: No results found.       Scheduled Meds:  sodium chloride    Intravenous Once   sodium chloride    Intravenous Once   acetaminophen   650 mg Oral Once   finasteride   5 mg Oral Daily   furosemide   20 mg Intravenous Once   tamsulosin   0.4 mg Oral Daily   Continuous Infusions:     LOS: 2 days    Time spent: 40 minutes    Toribio Hummer, MD [facility]   To contact the attending provider between 7A-7P or the covering provider during after hours 7P-7A, please log into the web site [email or web address] and access using universal [place] password for that web site. If you do not have the password, please call the hospital operator.  04/05/2024, 8:57 AM    "

## 2024-04-05 NOTE — Progress Notes (Signed)
 PT Cancellation Note  Patient Details Name: Jeremy Sawyer MRN: [id number] DOB: Sep 03, 1934   Cancelled Treatment:    Reason Eval/Treat Not Completed: Medical issues which prohibited therapy. Per RN, pt with 2units PRBC transfusing now, requesting we hold therapy until completed. Will continue to check back for PT eval as schedule permits.    Tori Ica Daye PT, DPT 04/05/2024, 12:12 PM

## 2024-04-05 NOTE — Progress Notes (Signed)
 OT Cancellation Note  Patient Details Name: Abrahm Mancia MRN: [id number] DOB: 10/17/1934   Cancelled Treatment:    Reason Eval/Treat Not Completed: Patient not medically ready Unable to participate in OT evaluation at this time. Currently receiving blood transfusion. Will follow up with pt at a later time.   Leita Howell, OTR/L,CBIS  Supplemental OT - [facility] and [facility] Secure Chat Preferred   04/05/2024, 2:25 PM

## 2024-04-05 NOTE — Progress Notes (Signed)
" °   04/05/24 0048  BiPAP/CPAP/SIPAP  $ Non-Invasive Home Ventilator  Subsequent  BiPAP/CPAP/SIPAP Pt Type Adult (self placement)  BiPAP/CPAP/SIPAP Resmed  Mask Type Nasal mask  Dentures removed? Not applicable  Mask Size Medium  Flow Rate 3 lpm  Patient Home Machine No  Patient Home Mask No  Patient Home Tubing No  Auto Titrate Yes  Minimum cmH2O 4 cmH2O  Maximum cmH2O 10 cmH2O  Device Plugged into RED Power Outlet Yes    "

## 2024-04-05 NOTE — Progress Notes (Addendum)
 "    [facility] Progress Note  CC: Painless hematochezia, anemia  Subjective: Patient endorsed passing dark red hematochezia last night around 10pm and last episode of dark red hematochezia with blood clots occurred at 2:am today. Patient did not inform night shift nurse about episodes of hematochezia. Day shift RN emptied 500cc dark red blood with clots from the bedside commode at 8:30am.  He denies having any dizziness, chest pain or shortness of breath.  No abdominal pain.  Vital signs stable, RN at the bedside.   Objective:   Colonoscopy 04/03/2024: - Old blood in the entire examined colon.  - Diverticulosis in the sigmoid colon, in the descending colon and in the transverse colon.  - Non-bleeding internal hemorrhoids.  - No specimens collected  Vital signs in last 24 hours: Temp:  [97.8 F (36.6 C)-98.7 F (37.1 C)] 98.7 F (37.1 C) (01/05 0542) Pulse Rate:  [78-87] 78 (01/05 0542) Resp:  [15] 15 (01/04 1310) BP: (122-138)/(57-75) 138/75 (01/05 0542) SpO2:  [97 %-100 %] 97 % (01/05 0542) Last BM Date : 04/03/24 General: Alert 89 year old male sitting up in the chair in no acute distress. Heart: Regular rate and rhythm, 2/6 systolic murmur. Pulm: Breath sounds clear throughout.  On room air. Abdomen: Soft, nondistended.  Nontender.  Positive bowel sounds to all 4 quadrants.  No palpable mass. Extremities: No lower extremity edema.  Stasis dermatitis bilaterally. Neurologic:  Alert and oriented x 4. Grossly normal neurologically. Psych:  Alert and cooperative. Normal mood and affect.  Intake/Output from previous day: 01/04 0701 - 01/05 0700 In: 480 [P.O.:480] Out: 750 [Urine:750] Intake/Output this shift: No intake/output data recorded.  Lab Results: Recent Labs    04/04/24 0711 04/04/24 1441 04/05/24 0341  WBC 10.0 7.5 6.6  HGB 8.7* 8.1* 7.2*  HCT 25.8* 23.8* 21.7*  PLT 92* 90* 85*   BMET Recent Labs    04/03/24 1324 04/04/24 0711  04/05/24 0341  NA 141 144 140  K 3.9 4.0 4.0  CL 110 114* 112*  CO2 21* 23 22  GLUCOSE 110* 108* 101*  BUN 22 20 19   CREATININE 1.39* 1.35* 1.34*  CALCIUM  8.0* 7.9* 7.7*   LFT No results for input(s): PROT, ALBUMIN, AST, ALT, ALKPHOS, BILITOT, BILIDIR, IBILI in the last 72 hours.  PT/INR No results for input(s): LABPROT, INR in the last 72 hours. Hepatitis Panel No results for input(s): HEPBSAG, HCVAB, HEPAIGM, HEPBIGM in the last 72 hours.  No results found.  Assessment / Plan:  89 year old male admitted with new onset painless hematochezia. H/H on admission was 13/39.9, but downtrended to nadir of 7.9/24. CTA negative for bleeding. Transfused 2 units RBCs on 1/3 -> 8.7 -> 8.1 -> today Hg 7.2.  He passed 500 cc of dark red blood with clots overnight, last episode at 2:30 AM.  Asymptomatic, hemodynamically stable.  Two units of PRBCs ordered per the hospitalist.  Colonoscopy in 04/03/2024 with diverticulosis in the sigmoid, descending, transverse colon and old blood in the colon lumen without active bleed or high-grade stigmata of recent bleed. Clinical presentation consistent with self-limiting diverticular bleed. - Clear liquid diet - Stat CTA if patient has any further hematochezia - Check H&H posttransfusion - Transfuse for Hg level < 8 - RN recheck BP and heart rate at this time - Await further recommendations per Dr. Abran  Thrombocytopenia. PLT count  159 -> 92 -> 90 -> 85.   AKI. Cr 1.22 -> 1.56 -> 1.39 -> 1.35 -> today 1.34  History  of CAD s/p stent 1998, no angina  History of OSA  Principal Problem:   Hematochezia Active Problems:   Hyperlipidemia   Diverticulosis of colon with hemorrhage   ABLA (acute blood loss anemia)   Hyperkalemia   Grade II internal hemorrhoids   Lower GI bleed   AKI (acute kidney injury)     LOS: 2 days   Jeremy Sawyer  04/05/2024, 8:57 AM  GI ATTENDING  Interval history and data reviewed.   Colonoscopy reviewed.  Case discussed with Dr. San.  Agree with interval progress note as outlined above.  The patient appears to have a stuttering diverticular bleed.  Agree with continued observation and transfusion as outlined above.  For significant bleeding, would proceed with CTA.  If CTA positive, then refer to interventional radiology for embolization therapy.  Hopefully this will resolve without needing that intervention.  Will follow.  Norleen SAILOR. Abran Raddle., M.D. [facility] of Gastroenterology  "

## 2024-04-05 NOTE — Plan of Care (Incomplete)

## 2024-04-05 NOTE — Progress Notes (Signed)
" °   04/05/24 2340  BiPAP/CPAP/SIPAP  BiPAP/CPAP/SIPAP Pt Type Adult  BiPAP/CPAP/SIPAP Resmed  Mask Type Nasal mask  Dentures removed? Not applicable  Mask Size Medium  Flow Rate 3 lpm  Patient Home Machine No  Patient Home Mask No  Patient Home Tubing No  Auto Titrate Yes  Minimum cmH2O 4 cmH2O  Maximum cmH2O 10 cmH2O  CPAP/SIPAP surface wiped down Yes  Device Plugged into RED Power Outlet Yes  BiPAP/CPAP /SiPAP Vitals  Resp (!) 24  MEWS Score/Color  MEWS Score 1  MEWS Score Color Green    "

## 2024-04-06 DIAGNOSIS — D62 Acute posthemorrhagic anemia: Secondary | ICD-10-CM | POA: Diagnosis not present

## 2024-04-06 DIAGNOSIS — D696 Thrombocytopenia, unspecified: Secondary | ICD-10-CM | POA: Diagnosis not present

## 2024-04-06 DIAGNOSIS — K921 Melena: Secondary | ICD-10-CM | POA: Diagnosis not present

## 2024-04-06 DIAGNOSIS — K5731 Diverticulosis of large intestine without perforation or abscess with bleeding: Secondary | ICD-10-CM | POA: Diagnosis not present

## 2024-04-06 DIAGNOSIS — E785 Hyperlipidemia, unspecified: Secondary | ICD-10-CM | POA: Diagnosis not present

## 2024-04-06 DIAGNOSIS — N179 Acute kidney failure, unspecified: Secondary | ICD-10-CM | POA: Diagnosis not present

## 2024-04-06 DIAGNOSIS — E875 Hyperkalemia: Secondary | ICD-10-CM | POA: Diagnosis not present

## 2024-04-06 LAB — BASIC METABOLIC PANEL WITH GFR
Anion gap: 7 (ref 5–15)
BUN: 12 mg/dL (ref 8–23)
CO2: 24 mmol/L (ref 22–32)
Calcium: 8.1 mg/dL — ABNORMAL LOW (ref 8.9–10.3)
Chloride: 107 mmol/L (ref 98–111)
Creatinine, Ser: 1.15 mg/dL (ref 0.61–1.24)
GFR, Estimated: >60 mL/min
Glucose, Bld: 96 mg/dL (ref 70–99)
Potassium: 3.6 mmol/L (ref 3.5–5.1)
Sodium: 138 mmol/L (ref 135–145)

## 2024-04-06 LAB — BPAM RBC
Blood Product Expiration Date: 202601312359
Blood Product Expiration Date: 202602012359
Blood Product Expiration Date: 202602012359
ISSUE DATE / TIME: 202601050952
ISSUE DATE / TIME: 202601051313
ISSUE DATE / TIME: 202601032227
ISSUE DATE / TIME: 202601040128
ISSUE DATE / TIME: 202601312359
Unit Type and Rh: 7300
Unit Type and Rh: 7300
Unit Type and Rh: 202601312359
Unit Type and Rh: 202601312359
Unit Type and Rh: 7300
Unit Type and Rh: 7300

## 2024-04-06 LAB — TYPE AND SCREEN
ABO/RH(D): B POS
Antibody Screen: NEGATIVE
Unit division: 0
Unit division: 0
Unit division: 0
Unit division: 0

## 2024-04-06 LAB — CBC
HCT: 26.4 % — ABNORMAL LOW (ref 39.0–52.0)
Hemoglobin: 9.1 g/dL — ABNORMAL LOW (ref 13.0–17.0)
MCH: 31.1 pg (ref 26.0–34.0)
MCHC: 34.5 g/dL (ref 30.0–36.0)
MCV: 90.1 fL (ref 80.0–100.0)
Platelets: 100 K/uL — ABNORMAL LOW (ref 150–400)
RBC: 2.93 MIL/uL — ABNORMAL LOW (ref 4.22–5.81)
RDW: 15.5 % (ref 11.5–15.5)
WBC: 8.1 K/uL (ref 4.0–10.5)
nRBC: 0 % (ref 0.0–0.2)

## 2024-04-06 MED ORDER — TRAMADOL HCL 50 MG PO TABS
25.0000 mg | ORAL_TABLET | Freq: Four times a day (QID) | ORAL | Status: AC | PRN
  Administered 2024-04-06 – 2024-04-07 (×3): 25 mg via ORAL
  Filled 2024-04-06 (×3): qty 1

## 2024-04-06 MED ORDER — METOPROLOL SUCCINATE ER 100 MG PO TB24
100.0000 mg | ORAL_TABLET | Freq: Every day | ORAL | Status: DC
  Administered 2024-04-06 – 2024-04-07 (×2): 100 mg via ORAL
  Filled 2024-04-06 (×2): qty 1

## 2024-04-06 NOTE — Progress Notes (Signed)
 Patient been asked multiple times throughout the night if he has had a bowel movement and he has stated he has not had any BM thru the night. Bedside commode checked and nothing on it.

## 2024-04-06 NOTE — Progress Notes (Signed)
 Mobility Specialist Progress Note:   04/06/24 1514  Mobility  Activity Ambulated with assistance  Level of Assistance Minimal assist, patient does 75% or more  Assistive Device Front wheel walker  Distance Ambulated (ft) 280 ft  Activity Response Tolerated well  Mobility Referral Yes  Mobility visit 1 Mobility  Mobility Specialist Start Time (ACUTE ONLY) 1407  Mobility Specialist Stop Time (ACUTE ONLY) 1418  Mobility Specialist Time Calculation (min) (ACUTE ONLY) 11 min   Pt was received in bed and agreed to mobility. Min A sit to stand. No complaints/issues during ambulation. Returned to bed with all needs met. Call bell in reach.  [employer] - Mobility Specialist

## 2024-04-06 NOTE — TOC Transition Note (Signed)
 Transition of Care [facility]) - Discharge Note   Patient Details  Name: Jeremy Sawyer MRN: [id number] Date of Birth: 1934-06-04  [facility] CM/SW Contact:  Tawni CHRISTELLA Eva, LCSW Phone Number: 04/06/2024, 12:43 PM   Clinical Narrative:     Pt to return back to [facility], family to provide transport back no ICM needs at this time, ICM sign off.   Final next level of care: Home/Self Care Barriers to Discharge: Barriers Resolved   Patient Goals and CMS Choice Patient states their goals for this hospitalization and ongoing recovery are:: retun home     [place] ownership interest in [facility].provided to:: Patient    Discharge Placement                    Patient and family notified of of transfer: 04/06/24  Discharge Plan and Services Additional resources added to the After Visit Summary for     Discharge Planning Services: CM Consult            DME Arranged: N/A DME Agency: NA       HH Arranged: NA HH Agency: NA        Social Drivers of Health (SDOH) Interventions SDOH Screenings   Food Insecurity: No Food Insecurity (04/03/2024)  Housing: Low Risk (04/03/2024)  Transportation Needs: No Transportation Needs (04/03/2024)  Utilities: Not At Risk (04/03/2024)  Social Connections: Moderately Isolated (04/02/2024)  Tobacco Use: Medium Risk (04/03/2024)     Readmission Risk Interventions     No data to display

## 2024-04-06 NOTE — Progress Notes (Signed)
 " PROGRESS NOTE    Jeremy Sawyer  FMW:[id number] DOB: 08-16-1934 DOA: 04/02/2024 PCP: [name], MD    Chief Complaint  Patient presents with   Hematochezia    Brief Narrative:  [name] is a 89 y.o. male with medical history significant for hypertension, hyperlipidemia, CAD, BPH on daily baby aspirin  being admitted to the hospital with 12 hours of hematochezia and concern for lower GI bleed.  Patient seen by GI underwent colonoscopy 04/03/2024 with findings concerning for diverticular bleed..    Assessment & Plan:   Principal Problem:   Hematochezia Active Problems:   Hyperlipidemia   Diverticulosis of colon with hemorrhage   ABLA (acute blood loss anemia)   Hyperkalemia   Grade II internal hemorrhoids   Lower GI bleed   AKI (acute kidney injury)  #1 lower GI bleed likely diverticular bleed/hematochezia/acute blood loss anemia -Patient presented with multiple episodes of painless hematochezia felt likely secondary to diverticular bleed in the setting of history of diverticulosis. - Patient noted to be hemodynamically stable on admission with no significant anemia. -CT angiogram GI bleed done was negative for active GI bleed/extravasation however did show significant left-sided diverticulosis. - Patient seen in consultation by GI and patient underwent colonoscopy 04/03/2024 which showed old blood in the entire examined colon, diverticulosis in the sigmoid colon, in the descending colon and in the transverse colon.  Nonbleeding internal hemorrhoids.  No specimens collected. - Post colonoscopy patient noted to have continued hematochezia on 04/03/2024.. - Hemoglobin on admission noted at 13.0. - Post colonoscopy hemoglobin dropped as low as 7.9 and patient transfused 2 units PRBCs overnight with posttransfusion hemoglobin at 8.7.   -Repeat hemoglobin noted at 7.2 (04/05/2024 ) status post transfusion 2 units PRBCs as patient noted to have bloody bowel movements on  04/05/2024 with hemoglobin currently at 9.1 today.   - Patient with no further bloody bowel movements today.   - Patient hemodynamically stable.  - Follow H&H. - Transfusion threshold hemoglobin < 8. - GI was following, recommended postdischarge CBC in 7 to 10 days by PCP.   - GI noted to have signed off on 04/04/2024 however due to ongoing bleeding on 04/05/2024, and drop in hemoglobin GI reconsulted.   - Patient assessed by GI who feel patient has a stuttering diverticular bleed and recommend continued observation and transfusion. - Per GI if patient with significant bleeding and hemodynamically unstable we will proceed with a CTA and if CTA is positive then refer to IR for embolization. -Diet being advanced. - Per GI.  2.  Hyperkalemia - Resolved.   3.  AKI -Likely secondary to prerenal azotemia in the setting of lower GI bleed. - Urinalysis unremarkable.   - Improved with hydration.  - Follow.  4.  Hypertension - Resume home regimen Toprol -XL 100 mg daily.   - Hold home regimen Norvasc  10 mg daily and if blood pressure remains stable could likely resume tomorrow.   5.  Hyperlipidemia - Continue to hold statin and resume on discharge.    6.  BPH - Continue home regimen Proscar , Flomax .   7.  Murmur - 2D echo with EF of 55 to 60%,NWMA, grade 1 DD, mild MVR, mild AVR. - Outpatient follow-up with cardiology.   DVT prophylaxis: SCDs Code Status: DNR limited Family Communication: Updated patient and daughter at bedside..  Disposition: Back to [facility] when clinically improved, resolution of GI bleed and stability of hemoglobin, and when cleared by GI hopefully in the next 24 to 48  hours..  Status is: Inpatient The patient will require care spanning > 2 midnights and should be moved to inpatient because: Severity of illness   Consultants:  Gastroenterology: Dr. San 04/02/2024  Procedures:  Colonoscopy: 04/03/2024 per GI: Dr. San CT angiogram GI bleed  04/02/2024 Transfusion 2 units PRBCs 04/03/2024-04/04/2024 Transfused 2 units PRBCs 04/05/2024 2D echo 04/05/2024  Antimicrobials:  Anti-infectives (From admission, onward)    None         Subjective: Patient sitting up in chair.  Denies any chest pain or shortness of breath.  No abdominal pain.  No further bloody bowel movements.  Feels well.  Daughter at bedside.  Very thankful for the care he has received.  Tolerated clear liquids and diet advanced to a full liquid diet that he stated he tolerated.   Objective: Vitals:   04/06/24 0005 04/06/24 0205 04/06/24 0513 04/06/24 1319  BP: 139/66  120/69 (!) 155/72  Pulse: 86  87 (!) 108  Resp: 18 (!) 21 18   Temp: 98.9 F (37.2 C)  98.9 F (37.2 C) 98.2 F (36.8 C)  TempSrc:   Oral Oral  SpO2: 98%  97% 100%  Weight:      Height:        Intake/Output Summary (Last 24 hours) at 04/06/2024 1323 Last data filed at 04/06/2024 0910 Gross per 24 hour  Intake 1078 ml  Output 1050 ml  Net 28 ml   Filed Weights   04/02/24 1351  Weight: 84 kg    Examination:  General exam: NAD. Respiratory system: CTAB.  No wheezes, no crackles, no rhonchi.  Fair air movement.  Speaking in full sentences.  Cardiovascular system: RRR with 3/6 SEM.  No JVD.  No lower extremity edema.  Gastrointestinal system: Abdomen is soft, nontender, nondistended, positive bowel sounds.  No rebound.  No guarding.  Central nervous system: Alert and oriented.  Moving extremities spontaneously.  No focal neurological deficits.  Extremities: Symmetric 5 x 5 power. Skin: No rashes, lesions or ulcers Psychiatry: Judgement and insight appear normal. Mood & affect appropriate.     Data Reviewed: I have personally reviewed following labs and imaging studies  CBC: Recent Labs  Lab 04/04/24 0711 04/04/24 1441 04/05/24 0341 04/05/24 1859 04/06/24 0426  WBC 10.0 7.5 6.6 8.4 8.1  NEUTROABS 8.2*  --  4.2  --   --   HGB 8.7* 8.1* 7.2* 9.3* 9.1*  HCT 25.8* 23.8* 21.7* 27.1*  26.4*  MCV 91.8 91.5 92.3 90.0 90.1  PLT 92* 90* 85* 94* 100*    Basic Metabolic Panel: Recent Labs  Lab 04/03/24 0347 04/03/24 1324 04/04/24 0711 04/05/24 0341 04/06/24 0426  NA 143 141 144 140 138  K 5.2* 3.9 4.0 4.0 3.6  CL 110 110 114* 112* 107  CO2 20* 21* 23 22 24   GLUCOSE 188* 110* 108* 101* 96  BUN 23 22 20 19 12   CREATININE 1.56* 1.39* 1.35* 1.34* 1.15  CALCIUM  9.1 8.0* 7.9* 7.7* 8.1*  MG  --   --  2.3  --   --     GFR: Estimated Creatinine Clearance: 46.7 mL/min (by C-G formula based on SCr of 1.15 mg/dL).  Liver Function Tests: Recent Labs  Lab 04/02/24 0830  AST 24  ALT 24  ALKPHOS 75  BILITOT 0.7  PROT 6.4*  ALBUMIN 3.8    CBG: Recent Labs  Lab 04/03/24 0626  GLUCAP 143*     No results found for this or any previous visit (from the past  240 hours).       Radiology Studies: ECHOCARDIOGRAM COMPLETE Result Date: 04/05/2024    ECHOCARDIOGRAM REPORT   Patient Name:   [facility] Date of Exam: 04/05/2024 Medical Rec #:  [id number]             Height:       70.0 in Accession #:    [id number]            Weight:       185.2 lb Date of Birth:  03-04-1935              BSA:          2.020 m Patient Age:    89 years              BP:           112/61 mmHg Patient Gender: M                     HR:           74 bpm. Exam Location:  Inpatient Procedure: 2D Echo, Cardiac Doppler and Color Doppler (Both Spectral and Color            Flow Doppler were utilized during procedure). Indications:    Murmur R01.1  History:        Patient has no prior history of Echocardiogram examinations.                 CAD; Risk Factors:Hypertension.  Sonographer:    Sydnee Wilson RDCS Referring Phys: 23 Trenell Moxey V Intisar Claudio IMPRESSIONS  1. Left ventricular ejection fraction, by estimation, is 55 to 60%. The left ventricle has normal function. The left ventricle has no regional wall motion abnormalities. Left ventricular diastolic parameters are consistent with Grade I diastolic  dysfunction (impaired relaxation).  2. Right ventricular systolic function is normal. The right ventricular size is normal.  3. Left atrial size was mildly dilated.  4. The mitral valve is degenerative. Mild mitral valve regurgitation. No evidence of mitral stenosis.  5. The aortic valve is calcified. Aortic valve regurgitation is mild. No aortic stenosis is present. Aortic valve mean gradient measures 8.0 mmHg. Aortic valve Vmax measures 1.96 m/s.  6. The inferior vena cava is normal in size with greater than 50% respiratory variability, suggesting right atrial pressure of 3 mmHg. FINDINGS  Left Ventricle: Left ventricular ejection fraction, by estimation, is 55 to 60%. The left ventricle has normal function. The left ventricle has no regional wall motion abnormalities. The left ventricular internal cavity size was normal in size. There is  no left ventricular hypertrophy. Left ventricular diastolic parameters are consistent with Grade I diastolic dysfunction (impaired relaxation). Right Ventricle: The right ventricular size is normal. No increase in right ventricular wall thickness. Right ventricular systolic function is normal. Left Atrium: Left atrial size was mildly dilated. Right Atrium: Right atrial size was normal in size. Pericardium: There is no evidence of pericardial effusion. Mitral Valve: The mitral valve is degenerative in appearance. Mild mitral annular calcification. Mild mitral valve regurgitation, with anteriorly-directed jet. No evidence of mitral valve stenosis. Tricuspid Valve: The tricuspid valve is normal in structure. Tricuspid valve regurgitation is not demonstrated. No evidence of tricuspid stenosis. Aortic Valve: The aortic valve is calcified. Aortic valve regurgitation is mild. Aortic regurgitation PHT measures 731 msec. No aortic stenosis is present. Aortic valve mean gradient measures 8.0 mmHg. Aortic valve peak gradient measures 15.4 mmHg. Aortic valve area, by VTI measures  1.90 cm.  Pulmonic Valve: The pulmonic valve was normal in structure. Pulmonic valve regurgitation is not visualized. No evidence of pulmonic stenosis. Aorta: The aortic root is normal in size and structure. Venous: The inferior vena cava is normal in size with greater than 50% respiratory variability, suggesting right atrial pressure of 3 mmHg. IAS/Shunts: No atrial level shunt detected by color flow Doppler.  LEFT VENTRICLE PLAX 2D LVIDd:         5.30 cm      Diastology LVIDs:         3.80 cm      LV e' medial:    10.60 cm/s LV PW:         1.30 cm      LV E/e' medial:  7.6 LV IVS:        0.80 cm      LV e' lateral:   10.40 cm/s LVOT diam:     2.00 cm      LV E/e' lateral: 7.8 LV SV:         66 LV SV Index:   33 LVOT Area:     3.14 cm  LV Volumes (MOD) LV vol d, MOD A2C: 152.0 ml LV vol d, MOD A4C: 124.0 ml LV vol s, MOD A2C: 61.1 ml LV vol s, MOD A4C: 53.6 ml LV SV MOD A2C:     90.9 ml LV SV MOD A4C:     124.0 ml LV SV MOD BP:      78.6 ml RIGHT VENTRICLE RV S prime:     23.00 cm/s TAPSE (M-mode): 2.6 cm LEFT ATRIUM             Index        RIGHT ATRIUM           Index LA diam:        3.90 cm 1.93 cm/m   RA Area:     15.10 cm LA Vol (A2C):   76.7 ml 37.97 ml/m  RA Volume:   28.80 ml  14.26 ml/m LA Vol (A4C):   43.6 ml 21.58 ml/m LA Biplane Vol: 56.8 ml 28.12 ml/m  AORTIC VALVE AV Area (Vmax):    1.80 cm AV Area (Vmean):   1.86 cm AV Area (VTI):     1.90 cm AV Vmax:           196.25 cm/s AV Vmean:          132.000 cm/s AV VTI:            0.350 m AV Peak Grad:      15.4 mmHg AV Mean Grad:      8.0 mmHg LVOT Vmax:         112.33 cm/s LVOT Vmean:        78.200 cm/s LVOT VTI:          0.211 m LVOT/AV VTI ratio: 0.60 AI PHT:            731 msec  AORTA Ao Root diam: 3.50 cm Ao Asc diam:  3.90 cm MITRAL VALVE                TRICUSPID VALVE MV Area (PHT): 1.46 cm     TR Peak grad:   21.3 mmHg MV E velocity: 81.00 cm/s   TR Vmax:        231.00 cm/s MV A velocity: 114.00 cm/s MV E/A ratio:  0.71         SHUNTS  Systemic VTI:  0.21 m                             Systemic Diam: 2.00 cm Oneil Parchment MD Electronically signed by Oneil Parchment MD Signature Date/Time: 04/05/2024/4:31:42 PM    Final          Scheduled Meds:  sodium chloride    Intravenous Once   finasteride   5 mg Oral Daily   tamsulosin   0.4 mg Oral Daily   Continuous Infusions:     LOS: 3 days    Time spent: 40 minutes    Toribio Hummer, MD [facility]   To contact the attending provider between 7A-7P or the covering provider during after hours 7P-7A, please log into the web site [email or web address] and access using universal [place] password for that web site. If you do not have the password, please call the hospital operator.  04/06/2024, 1:23 PM    "

## 2024-04-06 NOTE — Evaluation (Signed)
 Occupational Therapy Evaluation/Discharge Patient Details Name: Jeremy Sawyer MRN: [id number] DOB: Jul 08, 1934 Today's Date: 04/06/2024   History of Present Illness   89 y.o. male with medical history significant for hypertension, hyperlipidemia, CAD, BPH on daily baby aspirin  being admitted to the hospital with 12 hours of hematochezia. PMH: L THA 2019, AKI, HTN, OA R hip, CAD, BPH.     Clinical Impressions Patient evaluated by Occupational Therapy with no further acute OT needs identified. All education has been completed and the patient has no further questions. Prior to admit, pt reports that he lives at [place] and is independent with all ADL's and functional mobility. No follow-up Occupational Therapy or equipment needs. OT is signing off. Thank you for this referral.      If plan is discharge home, recommend the following:   A little help with bathing/dressing/bathroom     Functional Status Assessment   Patient has had a recent decline in their functional status and demonstrates the ability to make significant improvements in function in a reasonable and predictable amount of time.     Equipment Recommendations   None recommended by OT      Precautions/Restrictions   Precautions Precautions: Fall Recall of Precautions/Restrictions: Intact Restrictions Weight Bearing Restrictions Per Provider Order: No     Mobility Bed Mobility Overal bed mobility: Independent   Transfers Overall transfer level: Independent Equipment used: None       Balance Overall balance assessment: Mild deficits observed, not formally tested      ADL either performed or assessed with clinical judgement   ADL    General ADL Comments: Overall independent-mod I for BADL tasks. Required assist to doff/donn socks d/t left knee pain this morning.     Vision Baseline Vision/History: 1 Wears glasses Ability to See in Adequate Light: 0 Adequate Patient Visual Report: No  change from baseline Vision Assessment?: Wears glasses for reading     Perception Perception: Within Functional Limits       Praxis Praxis: [facility]       Pertinent Vitals/Pain Pain Assessment Pain Assessment: Faces Faces Pain Scale: Hurts even more Pain Location: left knee Pain Descriptors / Indicators: Aching, Grimacing, Guarding Pain Intervention(s): Premedicated before session, Monitored during session, Limited activity within patient's tolerance, Heat applied     Extremity/Trunk Assessment Upper Extremity Assessment Upper Extremity Assessment: Overall WFL for tasks assessed   Lower Extremity Assessment Lower Extremity Assessment: Defer to PT evaluation   Cervical / Trunk Assessment Cervical / Trunk Assessment: Normal   Communication Communication Communication: Impaired Factors Affecting Communication: Hearing impaired (wears hearing aids)   Cognition Arousal: Alert Behavior During Therapy: WFL for tasks assessed/performed Cognition: No apparent impairments    Following commands: Intact       Cueing  General Comments   Cueing Techniques: Verbal cues  VSS on RA           Home Living Family/patient expects to be discharged to:: Other (Comment) ([facility])    Additional Comments: Lives in a 1 bedroom apartment at [facility]. Handicap accessible. Is very active. Uses fitness center 7 days a week; uses pool 3 times a week.      Prior Functioning/Environment Prior Level of Function : Independent/Modified Independent    Mobility Comments: Uses no device for ambulation. ADLs Comments: Independent    OT Problem List: Pain;Impaired balance (sitting and/or standing)        OT Goals(Current goals can be found in the care plan section)   Acute Rehab OT Goals  Patient Stated Goal: to get out of bed OT Goal Formulation: All assessment and education complete, DC therapy   OT Frequency:   1 time visit       AM-PAC OT 6 Clicks Daily Activity      Outcome Measure Help from another person eating meals?: None Help from another person taking care of personal grooming?: None Help from another person toileting, which includes using toliet, bedpan, or urinal?: None Help from another person bathing (including washing, rinsing, drying)?: None Help from another person to put on and taking off regular upper body clothing?: None Help from another person to put on and taking off regular lower body clothing?: A Little 6 Click Score: 23   End of Session    Activity Tolerance: Patient tolerated treatment well;Patient limited by pain Patient left: in chair;with call bell/phone within reach  OT Visit Diagnosis: Muscle weakness (generalized) (M62.81)                Time: 9055-8994 OT Time Calculation (min): 21 min Charges:  OT General Charges $OT Visit: 1 Visit OT Evaluation $OT Eval Low Complexity: 1 Low  Leita Howell, OTR/L,CBIS  Supplemental OT - [facility] and [employer] Secure Chat Preferred    Micki Cassel, Leita BIRCH 04/06/2024, 10:14 AM

## 2024-04-06 NOTE — Progress Notes (Addendum)
 "    [facility] Progress Note  CC:  Painless hematochezia, anemia   Subjective: Patient did not pass any blood/hematochezia or stool overnight or thus far this morning. He denies having any N/V or abdominal pain. No CP or SOB. He is tolerating a clear liquid diet    Objective:   Colonoscopy 04/03/2024: - Old blood in the entire examined colon.  - Diverticulosis in the sigmoid colon, in the descending colon and in the transverse colon.  - Non-bleeding internal hemorrhoids.  - No specimens collected  Vital signs in last 24 hours: Temp:  [98.2 F (36.8 C)-99.3 F (37.4 C)] 98.9 F (37.2 C) (01/06 0513) Pulse Rate:  [75-87] 87 (01/06 0513) Resp:  [18-24] 18 (01/06 0513) BP: (112-139)/(56-70) 120/69 (01/06 0513) SpO2:  [97 %-100 %] 97 % (01/06 0513) Last BM Date : 04/05/24 General: Alert 89 year old male in NAD. Heart: Regular rate and rhythm. 2/6 systolic murmur. Pulm: Breath sounds clear throughout. On room air.  Abdomen: Protuberant.  Soft, nondistended.  Nontender.  Positive bowel sounds to all 4 quadrants.  No palpable mass.  Extremities: Trace bilateral ankle edema and bilateral stasis dermatitis  Neurologic:  Alert and oriented x 4. Speech is clear. Moves all extremities equally. Psych:  Alert and cooperative. Normal mood and affect.  Intake/Output from previous day: 01/05 0701 - 01/06 0700 In: 1158 [P.O.:480; Blood:678] Out: 1900 [Urine:1900] Intake/Output this shift: Total I/O In: -  Out: 250 [Urine:250]  Lab Results: Recent Labs    04/05/24 0341 04/05/24 1859 04/06/24 0426  WBC 6.6 8.4 8.1  HGB 7.2* 9.3* 9.1*  HCT 21.7* 27.1* 26.4*  PLT 85* 94* 100*   BMET Recent Labs    04/04/24 0711 04/05/24 0341 04/06/24 0426  NA 144 140 138  K 4.0 4.0 3.6  CL 114* 112* 107  CO2 23 22 24   GLUCOSE 108* 101* 96  BUN 20 19 12   CREATININE 1.35* 1.34* 1.15  CALCIUM  7.9* 7.7* 8.1*   LFT No results for input(s): PROT, ALBUMIN, AST, ALT,  ALKPHOS, BILITOT, BILIDIR, IBILI in the last 72 hours. PT/INR No results for input(s): LABPROT, INR in the last 72 hours. Hepatitis Panel No results for input(s): HEPBSAG, HCVAB, HEPAIGM, HEPBIGM in the last 72 hours.  ECHOCARDIOGRAM COMPLETE Result Date: 04/05/2024    ECHOCARDIOGRAM REPORT   Patient Name:   Jeremy Sawyer [facility] Date of Exam: 04/05/2024 Medical Rec #:  [id number]             Height:       70.0 in Accession #:    [id number]            Weight:       185.2 lb Date of Birth:  Feb 28, 1935              BSA:          2.020 m Patient Age:    89 years              BP:           112/61 mmHg Patient Gender: M                     HR:           74 bpm. Exam Location:  Inpatient Procedure: 2D Echo, Cardiac Doppler and Color Doppler (Both Spectral and Color            Flow Doppler were utilized during procedure). Indications:  Murmur R01.1  History:        Patient has no prior history of Echocardiogram examinations.                 CAD; Risk Factors:Hypertension.  Sonographer:    Sydnee Wilson RDCS Referring Phys: [id number] DANIEL V THOMPSON IMPRESSIONS  1. Left ventricular ejection fraction, by estimation, is 55 to 60%. The left ventricle has normal function. The left ventricle has no regional wall motion abnormalities. Left ventricular diastolic parameters are consistent with Grade I diastolic dysfunction (impaired relaxation).  2. Right ventricular systolic function is normal. The right ventricular size is normal.  3. Left atrial size was mildly dilated.  4. The mitral valve is degenerative. Mild mitral valve regurgitation. No evidence of mitral stenosis.  5. The aortic valve is calcified. Aortic valve regurgitation is mild. No aortic stenosis is present. Aortic valve mean gradient measures 8.0 mmHg. Aortic valve Vmax measures 1.96 m/s.  6. The inferior vena cava is normal in size with greater than 50% respiratory variability, suggesting right atrial pressure of 3 mmHg. FINDINGS  Left  Ventricle: Left ventricular ejection fraction, by estimation, is 55 to 60%. The left ventricle has normal function. The left ventricle has no regional wall motion abnormalities. The left ventricular internal cavity size was normal in size. There is  no left ventricular hypertrophy. Left ventricular diastolic parameters are consistent with Grade I diastolic dysfunction (impaired relaxation). Right Ventricle: The right ventricular size is normal. No increase in right ventricular wall thickness. Right ventricular systolic function is normal. Left Atrium: Left atrial size was mildly dilated. Right Atrium: Right atrial size was normal in size. Pericardium: There is no evidence of pericardial effusion. Mitral Valve: The mitral valve is degenerative in appearance. Mild mitral annular calcification. Mild mitral valve regurgitation, with anteriorly-directed jet. No evidence of mitral valve stenosis. Tricuspid Valve: The tricuspid valve is normal in structure. Tricuspid valve regurgitation is not demonstrated. No evidence of tricuspid stenosis. Aortic Valve: The aortic valve is calcified. Aortic valve regurgitation is mild. Aortic regurgitation PHT measures 731 msec. No aortic stenosis is present. Aortic valve mean gradient measures 8.0 mmHg. Aortic valve peak gradient measures 15.4 mmHg. Aortic valve area, by VTI measures 1.90 cm. Pulmonic Valve: The pulmonic valve was normal in structure. Pulmonic valve regurgitation is not visualized. No evidence of pulmonic stenosis. Aorta: The aortic root is normal in size and structure. Venous: The inferior vena cava is normal in size with greater than 50% respiratory variability, suggesting right atrial pressure of 3 mmHg. IAS/Shunts: No atrial level shunt detected by color flow Doppler.  LEFT VENTRICLE PLAX 2D LVIDd:         5.30 cm      Diastology LVIDs:         3.80 cm      LV e' medial:    10.60 cm/s LV PW:         1.30 cm      LV E/e' medial:  7.6 LV IVS:        0.80 cm      LV  e' lateral:   10.40 cm/s LVOT diam:     2.00 cm      LV E/e' lateral: 7.8 LV SV:         66 LV SV Index:   33 LVOT Area:     3.14 cm  LV Volumes (MOD) LV vol d, MOD A2C: 152.0 ml LV vol d, MOD A4C: 124.0 ml LV vol s, MOD A2C:  61.1 ml LV vol s, MOD A4C: 53.6 ml LV SV MOD A2C:     90.9 ml LV SV MOD A4C:     124.0 ml LV SV MOD BP:      78.6 ml RIGHT VENTRICLE RV S prime:     23.00 cm/s TAPSE (M-mode): 2.6 cm LEFT ATRIUM             Index        RIGHT ATRIUM           Index LA diam:        3.90 cm 1.93 cm/m   RA Area:     15.10 cm LA Vol (A2C):   76.7 ml 37.97 ml/m  RA Volume:   28.80 ml  14.26 ml/m LA Vol (A4C):   43.6 ml 21.58 ml/m LA Biplane Vol: 56.8 ml 28.12 ml/m  AORTIC VALVE AV Area (Vmax):    1.80 cm AV Area (Vmean):   1.86 cm AV Area (VTI):     1.90 cm AV Vmax:           196.25 cm/s AV Vmean:          132.000 cm/s AV VTI:            0.350 m AV Peak Grad:      15.4 mmHg AV Mean Grad:      8.0 mmHg LVOT Vmax:         112.33 cm/s LVOT Vmean:        78.200 cm/s LVOT VTI:          0.211 m LVOT/AV VTI ratio: 0.60 AI PHT:            731 msec  AORTA Ao Root diam: 3.50 cm Ao Asc diam:  3.90 cm MITRAL VALVE                TRICUSPID VALVE MV Area (PHT): 1.46 cm     TR Peak grad:   21.3 mmHg MV E velocity: 81.00 cm/s   TR Vmax:        231.00 cm/s MV A velocity: 114.00 cm/s MV E/A ratio:  0.71         SHUNTS                             Systemic VTI:  0.21 m                             Systemic Diam: 2.00 cm Oneil Parchment MD Electronically signed by Oneil Parchment MD Signature Date/Time: 04/05/2024/4:31:42 PM    Final     Assessment / Plan:  89 year old male admitted with new onset painless hematochezia. H/H on admission was 13/39.9, but downtrended to nadir of 7.9/24. CTA negative for bleeding. Transfused 2 units RBCs on 1/3 -> 8.7 -> 8.1 -> Hg 7.2.  He passed 500 cc of dark red blood with clots overnight 1/5 -> transfused 2 units of PRBCs -> Hg 9.3 -> today Hg 9.1. No further hematochezia over night or thus far  this morning. Colonoscopy in 04/03/2024 with diverticulosis in the sigmoid, descending, transverse colon and old blood in the colon lumen without active bleed or high-grade stigmata of recent bleed. Clinical presentation consistent with self-limiting diverticular bleed. Hemodynamically stable  - Full liquid diet, if no further hematochezia will advance to soft diet later today - Stat CTA if patient has any further  hematochezia - Transfuse for Hg level < 8 - Await further recommendations per Dr. Abran   Thrombocytopenia. PLT count  159 -> 92 -> 90 -> 85 -> 94 -> 100.   AKI. Cr 1.22 -> 1.56 -> 1.39 -> 1.35 -> today 1.34   History of CAD s/p stent 1998, no angina. Heart murmur. ECHO 1/052026 with LV EF 55 - 60% grade I diastolic dysfunction, mild MR and mild AR.   History of OSA    Principal Problem:   Hematochezia Active Problems:   Hyperlipidemia   Diverticulosis of colon with hemorrhage   ABLA (acute blood loss anemia)   Hyperkalemia   Grade II internal hemorrhoids   Lower GI bleed   AKI (acute kidney injury)     LOS: 3 days   Elida HERO Kennedy-Smith  04/06/2024, 9:18 AM  GI ATTENDING  Interval history and data reviewed.  Agree with interval progress note as outlined.  No further GI bleeding.  Stable hemoglobin.  Tolerating diet.  His diverticular bleeding seems to have stopped for the time being.  Agree with advancing diet and continuing to monitor stools as well as blood counts.  Will follow.  Norleen SAILOR. Abran Raddle., M.D. [facility] of Gastroenterology  "

## 2024-04-06 NOTE — Evaluation (Signed)
 Physical Therapy Evaluation Patient Details Name: Jeremy Sawyer MRN: [id number] DOB: [birth year] Today's Date: 04/06/2024  History of Present Illness  89 y.o. male with medical history significant for hypertension, hyperlipidemia, CAD, BPH on daily baby aspirin  being admitted to the hospital with 12 hours of hematochezia. PMH: L THA 2019, AKI, HTN, OA R hip, CAD, BPH.  Clinical Impression  Pt admitted with above diagnosis. PTA, pt ind without AD, active [facility] participating in group and personal exercise programs, denies falls, reports bone on bone pain in bil knees chronically with L knee pain flare up on eval. On eval, pt demonstrates limitations in L knee due to pain. Pt using RW for ambulation for pain control, supv, no knee buckling or overt LOB, increased weightbearing on BUE for pain control. Educated pt on time OOB and ambulating in hallway with nursing as able while in hospital and pt verbalizes agreement. Pt denies dizziness, lightheadedness, shortness of breath throughout eval. Recommend [facility]PT at [facility] for progressing back to personal exercise program and group exercise class. Pt currently with functional limitations due to the deficits listed below (see PT Problem List). Pt will benefit from acute skilled PT to increase their independence and safety with mobility to allow discharge.           If plan is discharge home, recommend the following:     Can travel by private vehicle        Equipment Recommendations None recommended by PT  Recommendations for Other Services       Functional Status Assessment Patient has not had a recent decline in their functional status     Precautions / Restrictions Precautions Precautions: Fall Recall of Precautions/Restrictions: Intact Restrictions Weight Bearing Restrictions Per Provider Order: No      Mobility  Bed Mobility               General bed mobility comments: in recliner upon arrival     Transfers Overall transfer level: Independent Equipment used: None                    Ambulation/Gait Ambulation/Gait assistance: Supervision Gait Distance (Feet): 350 Feet Assistive device: Rolling walker (2 wheels) Gait Pattern/deviations: Step-through pattern, Decreased stance time - left, Decreased weight shift to left Gait velocity: decreased     General Gait Details: step through gait pattern with decreased LLE stance and weightshifting due to pain, strong UE on RW, trunk slightly forward flexed due to increased UE weightbearig, no knee buckling or overt LOB  Stairs            Wheelchair Mobility     Tilt Bed    Modified Rankin (Stroke Patients Only)       Balance Overall balance assessment: Mild deficits observed, not formally tested                                           Pertinent Vitals/Pain Pain Assessment Pain Assessment: Faces Faces Pain Scale: Hurts even more Pain Location: left knee Pain Descriptors / Indicators: Aching, Grimacing, Guarding Pain Intervention(s): Limited activity within patient's tolerance, Monitored during session, Repositioned    Home Living Family/patient expects to be discharged to:: Other (Comment) ([facility])                   Additional Comments: Lives in a 1 bedroom apartment at [employer]. Handicap  accessible. Is very active. Uses fitness center 7 days a week; uses pool 3 times a week. Pt reports has RW and rollator from spouse.    Prior Function Prior Level of Function : Independent/Modified Independent;Driving             Mobility Comments: Uses no device for ambulation. denies falls ADLs Comments: Independent     Extremity/Trunk Assessment   Upper Extremity Assessment Upper Extremity Assessment: Defer to OT evaluation    Lower Extremity Assessment Lower Extremity Assessment: Overall WFL for tasks assessed;LLE deficits/detail LLE Deficits / Details: ankle and hip  AROM WFL, knee appears full extension in long sitting, flexion ~90 deg while seated in recliner, limited by pain LLE Sensation: WNL LLE Coordination: WNL    Cervical / Trunk Assessment Cervical / Trunk Assessment: Normal  Communication   Communication Communication: Impaired Factors Affecting Communication: Hearing impaired (wears hearing aids)    Cognition Arousal: Alert Behavior During Therapy: WFL for tasks assessed/performed                             Following commands: Intact       Cueing Cueing Techniques: Verbal cues     General Comments General comments (skin integrity, edema, etc.): HR max 106 noted, on RA, no complaints    Exercises General Exercises - Lower Extremity Hip Flexion/Marching: AROM, Both, 10 reps, Standing Other Exercises Other Exercises: standing hamstring curls with RW, x10 reps each   Assessment/Plan    PT Assessment Patient needs continued PT services  PT Problem List Decreased strength;Decreased activity tolerance;Decreased balance;Pain       PT Treatment Interventions DME instruction;Gait training;Functional mobility training;Therapeutic activities;Therapeutic exercise;Balance training;Patient/family education    PT Goals (Current goals can be found in the Care Plan section)  Acute Rehab PT Goals Patient Stated Goal: return to exercise program PT Goal Formulation: With patient Time For Goal Achievement: 04/20/24 Potential to Achieve Goals: Fair    Frequency Min 2X/week     Co-evaluation               AM-PAC PT 6 Clicks Mobility  Outcome Measure Help needed turning from your back to your side while in a flat bed without using bedrails?: None Help needed moving from lying on your back to sitting on the side of a flat bed without using bedrails?: None Help needed moving to and from a bed to a chair (including a wheelchair)?: None Help needed standing up from a chair using your arms (e.g., wheelchair or bedside  chair)?: None Help needed to walk in hospital room?: A Little Help needed climbing 3-5 steps with a railing? : A Little 6 Click Score: 22    End of Session Equipment Utilized During Treatment: Gait belt Activity Tolerance: Patient tolerated treatment well Patient left: in chair;with call bell/phone within reach;with family/visitor present Nurse Communication: Mobility status PT Visit Diagnosis: Muscle weakness (generalized) (M62.81);Pain Pain - Right/Left: Left Pain - part of body: Knee    Time: 1141-1207 PT Time Calculation (min) (ACUTE ONLY): 26 min   Charges:   PT Evaluation $PT Eval Low Complexity: 1 Low PT Treatments $Gait Training: 8-22 mins PT General Charges $$ ACUTE PT VISIT: 1 Visit         Tori Pleas Carneal PT, DPT 04/06/2024, 12:46 PM

## 2024-04-07 ENCOUNTER — Inpatient Hospital Stay (HOSPITAL_COMMUNITY)

## 2024-04-07 DIAGNOSIS — D62 Acute posthemorrhagic anemia: Secondary | ICD-10-CM | POA: Diagnosis not present

## 2024-04-07 DIAGNOSIS — K641 Second degree hemorrhoids: Secondary | ICD-10-CM

## 2024-04-07 DIAGNOSIS — K921 Melena: Secondary | ICD-10-CM | POA: Diagnosis not present

## 2024-04-07 DIAGNOSIS — E875 Hyperkalemia: Secondary | ICD-10-CM | POA: Diagnosis not present

## 2024-04-07 DIAGNOSIS — K5731 Diverticulosis of large intestine without perforation or abscess with bleeding: Secondary | ICD-10-CM | POA: Diagnosis not present

## 2024-04-07 DIAGNOSIS — N179 Acute kidney failure, unspecified: Secondary | ICD-10-CM | POA: Diagnosis not present

## 2024-04-07 DIAGNOSIS — E785 Hyperlipidemia, unspecified: Secondary | ICD-10-CM | POA: Diagnosis not present

## 2024-04-07 DIAGNOSIS — M25462 Effusion, left knee: Secondary | ICD-10-CM

## 2024-04-07 DIAGNOSIS — D696 Thrombocytopenia, unspecified: Secondary | ICD-10-CM | POA: Diagnosis not present

## 2024-04-07 LAB — BASIC METABOLIC PANEL WITH GFR
Anion gap: 8 (ref 5–15)
BUN: 11 mg/dL (ref 8–23)
CO2: 26 mmol/L (ref 22–32)
Calcium: 8.7 mg/dL — ABNORMAL LOW (ref 8.9–10.3)
Chloride: 103 mmol/L (ref 98–111)
Creatinine, Ser: 1.28 mg/dL — ABNORMAL HIGH (ref 0.61–1.24)
GFR, Estimated: 54 mL/min — ABNORMAL LOW
Glucose, Bld: 112 mg/dL — ABNORMAL HIGH (ref 70–99)
Potassium: 4.2 mmol/L (ref 3.5–5.1)
Sodium: 138 mmol/L (ref 135–145)

## 2024-04-07 LAB — CBC
HCT: 29.6 % — ABNORMAL LOW (ref 39.0–52.0)
Hemoglobin: 10 g/dL — ABNORMAL LOW (ref 13.0–17.0)
MCH: 31.1 pg (ref 26.0–34.0)
MCHC: 33.8 g/dL (ref 30.0–36.0)
MCV: 91.9 fL (ref 80.0–100.0)
Platelets: 115 K/uL — ABNORMAL LOW (ref 150–400)
RBC: 3.22 MIL/uL — ABNORMAL LOW (ref 4.22–5.81)
RDW: 15.1 % (ref 11.5–15.5)
WBC: 10.5 K/uL (ref 4.0–10.5)
nRBC: 0 % (ref 0.0–0.2)

## 2024-04-07 LAB — URIC ACID: Uric Acid, Serum: 5.8 mg/dL (ref 3.7–8.6)

## 2024-04-07 MED ORDER — TRAMADOL HCL 50 MG PO TABS: 25.0000 mg | ORAL_TABLET | Freq: Two times a day (BID) | ORAL | Status: DC | PRN

## 2024-04-07 MED ORDER — DICLOFENAC SODIUM 1 % EX GEL
4.0000 g | Freq: Four times a day (QID) | CUTANEOUS | Status: DC
  Administered 2024-04-07 (×2): 4 g via TOPICAL
  Filled 2024-04-07: qty 100

## 2024-04-07 MED ORDER — ACETAMINOPHEN 325 MG PO TABS
975.0000 mg | ORAL_TABLET | Freq: Four times a day (QID) | ORAL | Status: DC
  Administered 2024-04-07 (×2): 975 mg via ORAL
  Filled 2024-04-07 (×2): qty 3

## 2024-04-07 MED ORDER — NAPROXEN 250 MG PO TABS: 500.0000 mg | ORAL_TABLET | Freq: Two times a day (BID) | ORAL | Status: DC

## 2024-04-07 NOTE — Progress Notes (Signed)
" °   04/06/24 2355  BiPAP/CPAP/SIPAP  BiPAP/CPAP/SIPAP Pt Type Adult  BiPAP/CPAP/SIPAP Resmed  Mask Type Nasal mask  Dentures removed? Not applicable  Mask Size Medium  Respiratory Rate 20 breaths/min  FiO2 (%) 21 %  Patient Home Machine No  Patient Home Mask No  Patient Home Tubing No  Auto Titrate Yes  Minimum cmH2O 4 cmH2O  Maximum cmH2O 10 cmH2O  CPAP/SIPAP surface wiped down Yes  Device Plugged into RED Power Outlet Yes  BiPAP/CPAP /SiPAP Vitals  Resp 20  MEWS Score/Color  MEWS Score 0  MEWS Score Color Green    "

## 2024-04-07 NOTE — Progress Notes (Signed)
" °   04/07/24 2208  BiPAP/CPAP/SIPAP  BiPAP/CPAP/SIPAP Pt Type Adult  BiPAP/CPAP/SIPAP Resmed  Mask Type Nasal mask  Dentures removed? Not applicable  Mask Size Medium  Respiratory Rate 18 breaths/min  FiO2 (%) 21 %  Patient Home Machine No  Patient Home Mask No  Patient Home Tubing No  Auto Titrate Yes  Minimum cmH2O 4 cmH2O  Maximum cmH2O 10 cmH2O  CPAP/SIPAP surface wiped down Yes  Device Plugged into RED Power Outlet Yes  BiPAP/CPAP /SiPAP Vitals  Resp 18  MEWS Score/Color  MEWS Score 0  MEWS Score Color Green    "

## 2024-04-07 NOTE — Progress Notes (Signed)
 " PROGRESS NOTE  Jeremy Sawyer FMW:[id number] DOB: [birth year]   PCP: [name], MD  Patient is from: ALF  DOA: 04/02/2024 LOS: 4  Chief complaints Chief Complaint  Patient presents with   Hematochezia     Brief Narrative / Interim history: 89 year old M with PMH of CAD, BPH, HTN, HLD, osteoarthritis and diverticulosis presenting with 12 hours of hematochezia and admitted with ABLA due to lower GI bleed.  Hgb trended from 13.0 on admission to 7.2.  CT angio was negative for active extravasation.  He has received 4 units of PRBC throughout his hospitalization.  Underwent colonoscopy on 1/3 that showed old blood in the entire examined colon, diverticulosis seen in sigmoid, descending and transverse colon and nonbleeding internal hemorrhoids.  No specimen collected.  Eventually, hemoglobin stabilized and bleeding stopped.  Diet advanced.  GI signed off.   Patient with acute left knee pain and swelling.  X-ray concerning for severe osteoarthritis and moderate effusion.  Orthopedic surgery consulted.  Therapy recommended return to ALF with home health  Subjective: Seen and examined earlier this morning.  No major events overnight or this morning.  Complains of severe left knee pain with some swelling.  He reports having severe arthritis.  Unable to bend his knee.  Denies fall or trauma.   Assessment and plan: ABLA due to lower GI bleed/diverticular bleed: Seems to have resolved.  Received 4 units during this hospitalization.  CT angio colonoscopy as above.  Tolerating soft diet. Recent Labs    04/02/24 1842 04/03/24 0347 04/03/24 1324 04/03/24 2029 04/04/24 0711 04/04/24 1441 04/05/24 0341 04/05/24 1859 04/06/24 0426 04/07/24 0421  HGB 11.5* 11.8* 9.3* 7.9* 8.7* 8.1* 7.2* 9.3* 9.1* 10.0*  - Continue soft diet - Continue holding aspirin   Acute on chronic left knee pain: Likely due to underlying severe osteoarthritis.  X-ray also shows moderate effusion.  Uric acid within  normal.  Low suspicion for acute infection.  Patient takes Voltaren  tablets at home. - Orthopedic surgery consulted - Scheduled Tylenol  and topical Voltaren  gel - Tramadol  as needed.  AKI: Mild and improved. -Continue monitoring  Essential hypertension: Normotensive but soft. -Continue home Toprol -XL -Continue holding amlodipine .  Hyperlipidemia - Continue to hold statin and resume on discharge.     Hyperkalemia: Resolved.   BPH - Continue home regimen Proscar , Flomax .    Heart murmur: TTE with LVEF of 55 to 60%, G1 DD, mild MVR, mild AVR  Body mass index is 26.57 kg/m.          DVT prophylaxis:  Place and maintain sequential compression device Start: 04/03/24 1857 SCDs Start: 04/02/24 1107  Code Status: DNR Family Communication: None at bedside Level of care: Progressive Status is: Inpatient Remains inpatient appropriate because: ABLA, GI bleed and left knee pain  Final disposition: Back to ALF with therapy   55 minutes with more than 50% spent in reviewing records, counseling patient/family and coordinating care.  Consultants:  Gastroenterology Orthopedic surgery  Procedures: 1/3-colonoscopy  Microbiology summarized: None  Objective: Vitals:   04/07/24 0026 04/07/24 0517 04/07/24 0747 04/07/24 1411  BP:  (!) 153/66 115/60 (!) 111/51  Pulse:  75 73 71  Resp: 17 20 17 18   Temp:  97.7 F (36.5 C) 98.6 F (37 C) 99.9 F (37.7 C)  TempSrc:  Oral Oral Oral  SpO2:  100% 100% 100%  Weight:      Height:        Examination:  GENERAL: No apparent distress.  Nontoxic. HEENT: MMM.  Vision  and hearing grossly intact.  NECK: Supple.  No apparent JVD.  RESP:  No IWOB.  Fair aeration bilaterally. CVS:  RRR. Heart sounds normal.  ABD/GI/GU: BS+. Abd soft, NTND.  MSK/EXT:   Left knee swelling and effusion.  There is limited ROM.  Some erythema but no increased warmth to touch. SKIN: As above. NEURO: AA.  Oriented appropriately.  No apparent focal neuro  deficit. PSYCH: Calm. Normal affect.   Sch Meds:  Scheduled Meds:  sodium chloride    Intravenous Once   acetaminophen   975 mg Oral Q6H WA   diclofenac  Sodium  4 g Topical QID   finasteride   5 mg Oral Daily   metoprolol  succinate  100 mg Oral Daily   tamsulosin   0.4 mg Oral Daily   Continuous Infusions: PRN Meds:.albuterol , ondansetron  **OR** ondansetron  (ZOFRAN ) IV, traMADol , traZODone   Antimicrobials: Anti-infectives (From admission, onward)    None        I have personally reviewed the following labs and images: CBC: Recent Labs  Lab 04/04/24 0711 04/04/24 1441 04/05/24 0341 04/05/24 1859 04/06/24 0426 04/07/24 0421  WBC 10.0 7.5 6.6 8.4 8.1 10.5  NEUTROABS 8.2*  --  4.2  --   --   --   HGB 8.7* 8.1* 7.2* 9.3* 9.1* 10.0*  HCT 25.8* 23.8* 21.7* 27.1* 26.4* 29.6*  MCV 91.8 91.5 92.3 90.0 90.1 91.9  PLT 92* 90* 85* 94* 100* 115*   BMP &GFR Recent Labs  Lab 04/03/24 1324 04/04/24 0711 04/05/24 0341 04/06/24 0426 04/07/24 0421  NA 141 144 140 138 138  K 3.9 4.0 4.0 3.6 4.2  CL 110 114* 112* 107 103  CO2 21* 23 22 24 26   GLUCOSE 110* 108* 101* 96 112*  BUN 22 20 19 12 11   CREATININE 1.39* 1.35* 1.34* 1.15 1.28*  CALCIUM  8.0* 7.9* 7.7* 8.1* 8.7*  MG  --  2.3  --   --   --    Estimated Creatinine Clearance: 42 mL/min (A) (by C-G formula based on SCr of 1.28 mg/dL (H)). Liver & Pancreas: Recent Labs  Lab 04/02/24 0830  AST 24  ALT 24  ALKPHOS 75  BILITOT 0.7  PROT 6.4*  ALBUMIN 3.8   No results for input(s): LIPASE, AMYLASE in the last 168 hours. No results for input(s): AMMONIA in the last 168 hours. Diabetic: No results for input(s): HGBA1C in the last 72 hours. Recent Labs  Lab 04/03/24 0626  GLUCAP 143*   Cardiac Enzymes: No results for input(s): CKTOTAL, CKMB, CKMBINDEX, TROPONINI in the last 168 hours. No results for input(s): PROBNP in the last 8760 hours. Coagulation Profile: No results for input(s): INR,  PROTIME in the last 168 hours. Thyroid  Function Tests: No results for input(s): TSH, T4TOTAL, FREET4, T3FREE, THYROIDAB in the last 72 hours. Lipid Profile: No results for input(s): CHOL, HDL, LDLCALC, TRIG, CHOLHDL, LDLDIRECT in the last 72 hours. Anemia Panel: No results for input(s): VITAMINB12, FOLATE, FERRITIN, TIBC, IRON, RETICCTPCT in the last 72 hours. Urine analysis:    Component Value Date/Time   COLORURINE YELLOW 04/04/2024 1643   APPEARANCEUR HAZY (A) 04/04/2024 1643   LABSPEC 1.018 04/04/2024 1643   PHURINE 5.0 04/04/2024 1643   GLUCOSEU NEGATIVE 04/04/2024 1643   HGBUR NEGATIVE 04/04/2024 1643   BILIRUBINUR NEGATIVE 04/04/2024 1643   KETONESUR NEGATIVE 04/04/2024 1643   PROTEINUR NEGATIVE 04/04/2024 1643   UROBILINOGEN 0.2 05/09/2011 1345   NITRITE NEGATIVE 04/04/2024 1643   LEUKOCYTESUR NEGATIVE 04/04/2024 1643   Sepsis Labs: Invalid input(s): PROCALCITONIN,  LACTICIDVEN  Microbiology: No results found for this or any previous visit (from the past 240 hours).  Radiology Studies: DG Knee 1-2 Views Left Result Date: 04/07/2024 CLINICAL DATA:  Left knee pain EXAM: LEFT KNEE - 1-2 VIEW COMPARISON:  None Available. FINDINGS: No fracture or dislocation is noted. Moderate size suprapatellar joint effusion is noted. Vascular calcifications are noted. Severe narrowing and degenerative changes seen involving medial and lateral joint spaces. Moderate degenerative change in osteophyte formation is seen involving patellofemoral space. IMPRESSION: Severe degenerative joint disease. Moderate size suprapatellar joint effusion. No fracture or dislocation. Electronically Signed   By: Lynwood Landy Raddle M.D.   On: 04/07/2024 12:05      Shavonda Wiedman T. Darleen Moffitt [facility]  If 7PM-7AM, please contact night-coverage [email or web address] 04/07/2024, 2:38 PM   "

## 2024-04-07 NOTE — Consult Note (Signed)
"      Consult Note  Date of consultation: 04/07/2024  Reason for consultation: Large left knee effusion and need for aspiration with steroid injection  Consulting provider: Kathrin, MD  HPI: The patient is an 89 year old gentleman who has been hospitalized recently for GI issues and a GI bleed.  He has a known history of chronic osteoarthritis involving both his knees with the left worse than the right.  During this hospitalization he is noted to have a very large left knee joint effusion.  Orthopedic surgery was consulted for an aspiration and steroid injection.  The patient does report this has been going on for a long period time but has been getting worse.  He has seen Ortho orthopedic providers in the remote past.  Exam: On exam his left knee has a very large joint effusion.  The knee is not red.  There is pain throughout the arc of motion of the left knee.  There is varus malalignment.  X-rays: X-rays reviewed of the left knee showing end-stage bone-on-bone arthritis and a large joint effusion.  Assessment: Left knee osteoarthritis with large joint effusion  Plan: I was able to clean the knee with alcohol  swabs and then aspirate 80 cc of fluid from the left knee consistent with osteoarthritis and and inflammatory process.  I then placed a steroid injection in the left knee that did give him immediate relief.  He understands it will take a few days for the steroid to actually help but just taking the large amount of fluid off his knee gave him relief.  I did let him know he can see us  in the office at any time as an outpatient over the next several weeks if needed. "

## 2024-04-07 NOTE — Plan of Care (Signed)
" °  Problem: Education: Goal: Knowledge of General Education information will improve Description: Including pain rating scale, medication(s)/side effects and non-pharmacologic comfort measures Outcome: Progressing   Problem: Clinical Measurements: Goal: Ability to maintain clinical measurements within normal limits will improve Outcome: Progressing Goal: Respiratory complications will improve Outcome: Progressing Goal: Cardiovascular complication will be avoided Outcome: Progressing   Problem: Activity: Goal: Risk for activity intolerance will decrease Outcome: Progressing   Problem: Pain Managment: Goal: General experience of comfort will improve and/or be controlled Outcome: Progressing   "

## 2024-04-07 NOTE — Progress Notes (Signed)
 Mobility Specialist - Progress Note   04/07/24 0918  Mobility  Activity Ambulated with assistance  Level of Assistance Contact guard assist, steadying assist  Assistive Device Front wheel walker  Distance Ambulated (ft) 130 ft  Range of Motion/Exercises Active  Activity Response Tolerated fair  Mobility visit 1 Mobility  Mobility Specialist Start Time (ACUTE ONLY) 0850  Mobility Specialist Stop Time (ACUTE ONLY) 0915  Mobility Specialist Time Calculation (min) (ACUTE ONLY) 25 min   Pt was found on recliner chair and agreeable to mobilize. C/o L knee swelling and pain. At EOS returned to recliner chair with all needs met. Call bell in reach.   Erminio Leos,  Mobility Specialist Can be reached via Secure Chat

## 2024-04-07 NOTE — Progress Notes (Addendum)
 "    [facility] Progress Note  CC:  Painless hematochezia, anemia   Subjective: He is tolerating a soft diet.  No BM or hematochezia yesterday, overnight or thus far this morning.  Last episode of dark red hematochezia was 2 AM on 1/5.  Nausea, vomiting or abdominal pain.  He endorses having significant left knee pain and is having difficulty bearing weight.    Objective:   Colonoscopy 04/03/2024: - Old blood in the entire examined colon.  - Diverticulosis in the sigmoid colon, in the descending colon and in the transverse colon.  - Non-bleeding internal hemorrhoids.  - No specimens collected  Vital signs in last 24 hours: Temp:  [97.7 F (36.5 C)-99.7 F (37.6 C)] 98.6 F (37 C) (01/07 0747) Pulse Rate:  [73-108] 73 (01/07 0747) Resp:  [16-20] 17 (01/07 0747) BP: (115-155)/(60-72) 115/60 (01/07 0747) SpO2:  [94 %-100 %] 100 % (01/07 0747) FiO2 (%):  [21 %] 21 % (01/06 2355) Last BM Date : 04/05/24 General: Alert 89 year old male in no acute distress. Heart: Regular rate and rhythm, 2/6 systolic murmur. Pulm: Breath sounds clear, on room air. Abdomen: Protuberant, soft.  Nontender.  Positive bowel sounds to all 4 quadrants. Extremities: Left knee edema, ice pack applied.  Lateral stasis dermatitis. Neurologic:  Alert and oriented x 4. Grossly normal neurologically. Psych:  Alert and cooperative. Normal mood and affect.  Intake/Output from previous day: 01/06 0701 - 01/07 0700 In: 1200 [P.O.:1200] Out: 1025 [Urine:1025] Intake/Output this shift: Total I/O In: 200 [P.O.:200] Out: -   Lab Results: Recent Labs    04/05/24 1859 04/06/24 0426 04/07/24 0421  WBC 8.4 8.1 10.5  HGB 9.3* 9.1* 10.0*  HCT 27.1* 26.4* 29.6*  PLT 94* 100* 115*   BMET Recent Labs    04/05/24 0341 04/06/24 0426 04/07/24 0421  NA 140 138 138  K 4.0 3.6 4.2  CL 112* 107 103  CO2 22 24 26   GLUCOSE 101* 96 112*  BUN 19 12 11   CREATININE 1.34* 1.15 1.28*  CALCIUM  7.7*  8.1* 8.7*   LFT No results for input(s): PROT, ALBUMIN, AST, ALT, ALKPHOS, BILITOT, BILIDIR, IBILI in the last 72 hours. PT/INR No results for input(s): LABPROT, INR in the last 72 hours. Hepatitis Panel No results for input(s): HEPBSAG, HCVAB, HEPAIGM, HEPBIGM in the last 72 hours.  ECHOCARDIOGRAM COMPLETE Result Date: 04/05/2024    ECHOCARDIOGRAM REPORT   Patient Name:   Jeremy Sawyer [facility] Date of Exam: 04/05/2024 Medical Rec #:  [id number]             Height:       70.0 in Accession #:    [id number]            Weight:       185.2 lb Date of Birth:  05-15-1934              BSA:          2.020 m Patient Age:    89 years              BP:           112/61 mmHg Patient Gender: M                     HR:           74 bpm. Exam Location:  Inpatient Procedure: 2D Echo, Cardiac Doppler and Color Doppler (Both Spectral and Color  Flow Doppler were utilized during procedure). Indications:    Murmur R01.1  History:        Patient has no prior history of Echocardiogram examinations.                 CAD; Risk Factors:Hypertension.  Sonographer:    Sydnee Wilson RDCS Referring Phys: 3 DANIEL V THOMPSON IMPRESSIONS  1. Left ventricular ejection fraction, by estimation, is 55 to 60%. The left ventricle has normal function. The left ventricle has no regional wall motion abnormalities. Left ventricular diastolic parameters are consistent with Grade I diastolic dysfunction (impaired relaxation).  2. Right ventricular systolic function is normal. The right ventricular size is normal.  3. Left atrial size was mildly dilated.  4. The mitral valve is degenerative. Mild mitral valve regurgitation. No evidence of mitral stenosis.  5. The aortic valve is calcified. Aortic valve regurgitation is mild. No aortic stenosis is present. Aortic valve mean gradient measures 8.0 mmHg. Aortic valve Vmax measures 1.96 m/s.  6. The inferior vena cava is normal in size with greater than 50% respiratory  variability, suggesting right atrial pressure of 3 mmHg. FINDINGS  Left Ventricle: Left ventricular ejection fraction, by estimation, is 55 to 60%. The left ventricle has normal function. The left ventricle has no regional wall motion abnormalities. The left ventricular internal cavity size was normal in size. There is  no left ventricular hypertrophy. Left ventricular diastolic parameters are consistent with Grade I diastolic dysfunction (impaired relaxation). Right Ventricle: The right ventricular size is normal. No increase in right ventricular wall thickness. Right ventricular systolic function is normal. Left Atrium: Left atrial size was mildly dilated. Right Atrium: Right atrial size was normal in size. Pericardium: There is no evidence of pericardial effusion. Mitral Valve: The mitral valve is degenerative in appearance. Mild mitral annular calcification. Mild mitral valve regurgitation, with anteriorly-directed jet. No evidence of mitral valve stenosis. Tricuspid Valve: The tricuspid valve is normal in structure. Tricuspid valve regurgitation is not demonstrated. No evidence of tricuspid stenosis. Aortic Valve: The aortic valve is calcified. Aortic valve regurgitation is mild. Aortic regurgitation PHT measures 731 msec. No aortic stenosis is present. Aortic valve mean gradient measures 8.0 mmHg. Aortic valve peak gradient measures 15.4 mmHg. Aortic valve area, by VTI measures 1.90 cm. Pulmonic Valve: The pulmonic valve was normal in structure. Pulmonic valve regurgitation is not visualized. No evidence of pulmonic stenosis. Aorta: The aortic root is normal in size and structure. Venous: The inferior vena cava is normal in size with greater than 50% respiratory variability, suggesting right atrial pressure of 3 mmHg. IAS/Shunts: No atrial level shunt detected by color flow Doppler.  LEFT VENTRICLE PLAX 2D LVIDd:         5.30 cm      Diastology LVIDs:         3.80 cm      LV e' medial:    10.60 cm/s LV PW:          1.30 cm      LV E/e' medial:  7.6 LV IVS:        0.80 cm      LV e' lateral:   10.40 cm/s LVOT diam:     2.00 cm      LV E/e' lateral: 7.8 LV SV:         66 LV SV Index:   33 LVOT Area:     3.14 cm  LV Volumes (MOD) LV vol d, MOD A2C: 152.0 ml LV vol  d, MOD A4C: 124.0 ml LV vol s, MOD A2C: 61.1 ml LV vol s, MOD A4C: 53.6 ml LV SV MOD A2C:     90.9 ml LV SV MOD A4C:     124.0 ml LV SV MOD BP:      78.6 ml RIGHT VENTRICLE RV S prime:     23.00 cm/s TAPSE (M-mode): 2.6 cm LEFT ATRIUM             Index        RIGHT ATRIUM           Index LA diam:        3.90 cm 1.93 cm/m   RA Area:     15.10 cm LA Vol (A2C):   76.7 ml 37.97 ml/m  RA Volume:   28.80 ml  14.26 ml/m LA Vol (A4C):   43.6 ml 21.58 ml/m LA Biplane Vol: 56.8 ml 28.12 ml/m  AORTIC VALVE AV Area (Vmax):    1.80 cm AV Area (Vmean):   1.86 cm AV Area (VTI):     1.90 cm AV Vmax:           196.25 cm/s AV Vmean:          132.000 cm/s AV VTI:            0.350 m AV Peak Grad:      15.4 mmHg AV Mean Grad:      8.0 mmHg LVOT Vmax:         112.33 cm/s LVOT Vmean:        78.200 cm/s LVOT VTI:          0.211 m LVOT/AV VTI ratio: 0.60 AI PHT:            731 msec  AORTA Ao Root diam: 3.50 cm Ao Asc diam:  3.90 cm MITRAL VALVE                TRICUSPID VALVE MV Area (PHT): 1.46 cm     TR Peak grad:   21.3 mmHg MV E velocity: 81.00 cm/s   TR Vmax:        231.00 cm/s MV A velocity: 114.00 cm/s MV E/A ratio:  0.71         SHUNTS                             Systemic VTI:  0.21 m                             Systemic Diam: 2.00 cm Oneil Parchment MD Electronically signed by Oneil Parchment MD Signature Date/Time: 04/05/2024/4:31:42 PM    Final     Assessment / Plan:  89 year old male admitted with new onset painless hematochezia. H/H on admission was 13/39.9, but downtrended to nadir of 7.9/24. CTA negative for bleeding. Transfused 2 units RBCs on 1/3 -> 8.7 -> 8.1 -> Hg 7.2.  He passed 500 cc of dark red blood with clots overnight 1/5 -> transfused 2 units of PRBCs -> Hg  9.3 -> Hg 9.1 -> today Hg 10.0. Colonoscopy 04/03/2024 showed diverticulosis in the sigmoid, descending, transverse colon and old blood in the colon lumen without active bleed or high-grade stigmata of recent bleed. Clinical presentation consistent with self-limiting diverticular bleed.  No further hematochezia or BM since early a.m. 1/5.  Hemodynamically stable. - Heart healthy diet - Continue to monitor for GI bleeding,  nursing staff to document bowel movements every shift - CBC in a.m. - Okay to discharge patient home from GI perspective, however, patient is experiencing acute on chronic left knee pain with inability to bear significant weight therefore discharge on hold, defer further discharge recommendations to the hospitalist   Thrombocytopenia. PLT s 115.   AKI. Cr 1.22 -> 1.56 -> 1.39 -> 1.35 ->1.34 -> 1.28.  Left knee pain -Management per the hospitalist   History of CAD s/p stent 1998, no angina. Heart murmur. ECHO 1/052026 with LV EF 55 - 60% grade I diastolic dysfunction, mild MR and mild AR.   History of OSA    Principal Problem:   Hematochezia Active Problems:   Hyperlipidemia   Diverticulosis of colon with hemorrhage   ABLA (acute blood loss anemia)   Hyperkalemia   Grade II internal hemorrhoids   Lower GI bleed   AKI (acute kidney injury)     LOS: 4 days   Elida HERO Kennedy-Smith  04/07/2024, 10:28 AM  GI ATTENDING  Interval history and data reviewed.  Patient seen and examined.  Agree with interval progress note.  This patient has had no further bleeding for several days.  He is strongly believed to have had a self-limited diverticular bleed.  Advance diet.  From GI perspective, okay for discharge.  He does not need outpatient GI follow-up.  Will sign off.  Thanks.  Norleen SAILOR. Abran Raddle., M.D. [facility] Division of Gastroenterology  "

## 2024-04-07 NOTE — Plan of Care (Signed)

## 2024-04-08 ENCOUNTER — Other Ambulatory Visit (HOSPITAL_COMMUNITY): Payer: Self-pay

## 2024-04-08 DIAGNOSIS — E785 Hyperlipidemia, unspecified: Secondary | ICD-10-CM | POA: Diagnosis not present

## 2024-04-08 DIAGNOSIS — N179 Acute kidney failure, unspecified: Secondary | ICD-10-CM | POA: Diagnosis not present

## 2024-04-08 DIAGNOSIS — M25462 Effusion, left knee: Secondary | ICD-10-CM | POA: Diagnosis not present

## 2024-04-08 DIAGNOSIS — D62 Acute posthemorrhagic anemia: Secondary | ICD-10-CM | POA: Diagnosis not present

## 2024-04-08 DIAGNOSIS — E875 Hyperkalemia: Secondary | ICD-10-CM | POA: Diagnosis not present

## 2024-04-08 DIAGNOSIS — K641 Second degree hemorrhoids: Secondary | ICD-10-CM | POA: Diagnosis not present

## 2024-04-08 DIAGNOSIS — K921 Melena: Secondary | ICD-10-CM | POA: Diagnosis not present

## 2024-04-08 DIAGNOSIS — K5731 Diverticulosis of large intestine without perforation or abscess with bleeding: Secondary | ICD-10-CM | POA: Diagnosis not present

## 2024-04-08 LAB — RENAL FUNCTION PANEL
Albumin: 3 g/dL — ABNORMAL LOW (ref 3.5–5.0)
Anion gap: 9 (ref 5–15)
BUN: 18 mg/dL (ref 8–23)
CO2: 25 mmol/L (ref 22–32)
Calcium: 8.3 mg/dL — ABNORMAL LOW (ref 8.9–10.3)
Chloride: 99 mmol/L (ref 98–111)
Creatinine, Ser: 1.32 mg/dL — ABNORMAL HIGH (ref 0.61–1.24)
GFR, Estimated: 52 mL/min — ABNORMAL LOW
Glucose, Bld: 136 mg/dL — ABNORMAL HIGH (ref 70–99)
Phosphorus: 4.2 mg/dL (ref 2.5–4.6)
Potassium: 4.4 mmol/L (ref 3.5–5.1)
Sodium: 133 mmol/L — ABNORMAL LOW (ref 135–145)

## 2024-04-08 LAB — CBC
HCT: 28.2 % — ABNORMAL LOW (ref 39.0–52.0)
Hemoglobin: 9.6 g/dL — ABNORMAL LOW (ref 13.0–17.0)
MCH: 31.2 pg (ref 26.0–34.0)
MCHC: 34 g/dL (ref 30.0–36.0)
MCV: 91.6 fL (ref 80.0–100.0)
Platelets: 131 K/uL — ABNORMAL LOW (ref 150–400)
RBC: 3.08 MIL/uL — ABNORMAL LOW (ref 4.22–5.81)
RDW: 14.9 % (ref 11.5–15.5)
WBC: 9.3 K/uL (ref 4.0–10.5)
nRBC: 0 % (ref 0.0–0.2)

## 2024-04-08 LAB — MAGNESIUM: Magnesium: 2.1 mg/dL (ref 1.7–2.4)

## 2024-04-08 MED ORDER — DICLOFENAC SODIUM 1 % EX GEL
4.0000 g | Freq: Four times a day (QID) | CUTANEOUS | 2 refills | Status: AC
  Filled 2024-04-08: qty 100, 30d supply, fill #0

## 2024-04-08 NOTE — [place] FL2 (Signed)
 " [place]  MEDICAID FL2 LEVEL OF CARE FORM     IDENTIFICATION  Patient Name: Jeremy Sawyer Birthdate: Feb 27, 1935 Sex: male Admission Date (Current Location): 04/02/2024  [facility] and [place] Number:  Producer, Television/film/video and Address:  [facility],  [address] [place], [place] [address]      Provider Number: [id number]  Attending Physician Name and Address:  Kathrin Mignon DASEN, MD  Relative Name and Phone Number:  [facility]  Daughter, Emergency Contact  [phone] [facility] Phone)    Current Level of Care: Hospital Recommended Level of Care: Skilled Nursing Facility Prior Approval Number:    Date Approved/Denied:   PASRR Number: [id number]  Discharge Plan: SNF    Current Diagnoses: Patient Active Problem List   Diagnosis Date Noted   Effusion, left knee 04/07/2024   Hyperkalemia 04/03/2024   Grade II internal hemorrhoids 04/03/2024   Lower GI bleed 04/03/2024   AKI (acute kidney injury) 04/03/2024   Hematochezia 04/02/2024   Diverticulosis of colon with hemorrhage 04/02/2024   ABLA (acute blood loss anemia) 04/02/2024   OSA on CPAP 01/02/2023   Avascular necrosis of bone of hip, left (HCC) 01/02/2023   Chronic venous insufficiency 03/18/2018   BPH (benign prostatic hyperplasia) 03/11/2018   Fracture of unspecified part of neck of left femur, initial encounter for closed fracture (HCC) 03/07/2018   Osteoarthritis of right hip 05/17/2011   Anemia due to blood loss 05/17/2011   CAD (coronary artery disease) 05/09/2011   HTN (hypertension) 05/09/2011   Hyperlipidemia 05/09/2011   Pre-op evaluation 05/09/2011    Orientation RESPIRATION BLADDER Height & Weight     Self, Time, Situation, Place  Normal Continent Weight: 185 lb 3 oz (84 kg) Height:  5' 10 (177.8 cm)  BEHAVIORAL SYMPTOMS/MOOD NEUROLOGICAL BOWEL NUTRITION STATUS      Continent Diet (soft)  AMBULATORY STATUS COMMUNICATION OF NEEDS Skin   Limited Assist Verbally Normal                        Personal Care Assistance Level of Assistance  Bathing, Feeding, Dressing Bathing Assistance: Limited assistance Feeding assistance: Independent Dressing Assistance: Limited assistance     Functional Limitations Info  Sight, Hearing, Speech Sight Info: Adequate Hearing Info: Adequate Speech Info: Adequate    SPECIAL CARE FACTORS FREQUENCY  OT (By licensed OT), PT (By licensed PT)     PT Frequency: 5 x a week OT Frequency: 5 x a week            Contractures Contractures Info: Not present    Additional Factors Info  Code Status, Allergies Code Status Info: DNR Allergies Info: Full           Current Medications (04/08/2024):  This is the current hospital active medication list Current Facility-Administered Medications  Medication Dose Route Frequency Provider Last Rate Last Admin   0.9 %  sodium chloride  infusion (Manually program via Guardrails IV Fluids)   Intravenous Once Blondie Lynwood POUR, NP       acetaminophen  (TYLENOL ) tablet 975 mg  975 mg Oral Q6H WA Gonfa, Taye T, MD   975 mg at 04/07/24 2108   albuterol  (PROVENTIL ) (2.5 MG/3ML) 0.083% nebulizer solution 2.5 mg  2.5 mg Nebulization Q2H PRN Cirigliano, Vito V, DO       diclofenac  Sodium (VOLTAREN ) 1 % topical gel 4 g  4 g Topical QID Gonfa, Taye T, MD   4 g at 04/07/24 1832   finasteride  (PROSCAR ) tablet 5  mg  5 mg Oral Daily Cirigliano, Vito V, DO   5 mg at 04/07/24 1018   metoprolol  succinate (TOPROL -XL) 24 hr tablet 100 mg  100 mg Oral Daily Sebastian Toribio GAILS, MD   100 mg at 04/07/24 1018   ondansetron  (ZOFRAN ) tablet 4 mg  4 mg Oral Q6H PRN Cirigliano, Vito V, DO       Or   ondansetron  (ZOFRAN ) injection 4 mg  4 mg Intravenous Q6H PRN Cirigliano, Vito V, DO   4 mg at 04/02/24 2133   tamsulosin  (FLOMAX ) capsule 0.4 mg  0.4 mg Oral Daily Cirigliano, Vito V, DO   0.4 mg at 04/07/24 1018   traMADol  (ULTRAM ) tablet 25 mg  25 mg Oral Q12H PRN Gonfa, Taye T, MD       traZODone  (DESYREL ) tablet 25 mg  25 mg  Oral QHS PRN Cirigliano, Vito V, DO         Discharge Medications: Please see discharge summary for a list of discharge medications.  Relevant Imaging Results:  Relevant Lab Results:   Additional Information [id number]  Tawni HERO Neleh Muldoon, LCSW     "

## 2024-04-08 NOTE — Discharge Summary (Signed)
 "  Physician Discharge Summary  Jeremy Sawyer FMW:[id number] DOB: 12-10-1934 DOA: 04/02/2024  PCP: [name], MD  Admit date: 04/02/2024 Discharge date: 04/08/2024  Admitted From: ALF Disposition: ALF Recommendations for Outpatient Follow-up:  Outpatient follow-up with PCP, cardiology and orthopedic surgery as below Check CMP and CBC in 1 week Please follow up on the following pending results: None  Home Health: PT/OT Equipment/Devices: No new need identified.  Discharge Condition: Stable CODE STATUS: DNR   Follow-up Information     Jordan, Peter M, MD. Schedule an appointment as soon as possible for a visit in 4 week(s).   Specialty: Cardiology Contact information: [address] [place] [address] [phone]         [name], MD. Schedule an appointment as soon as possible for a visit in 1 week(s).   Specialty: Internal Medicine Contact information: [address] [place] [address] [phone]         Vernetta Lonni GRADE, MD Follow up in 6 week(s).   Specialty: Orthopedic Surgery Contact information: [address] [place]  [place] [address] [phone]                 Hospital course 89 year old M with PMH of CAD, BPH, HTN, HLD, osteoarthritis and diverticulosis presenting with 12 hours of hematochezia and admitted with ABLA due to lower GI bleed.  Hgb trended from 13.0 on admission to 7.2.  CT angio was negative for active extravasation.  He has received 4 units of PRBC throughout his hospitalization.  Underwent colonoscopy on 1/3 that showed old blood in the entire examined colon, diverticulosis seen in sigmoid, descending and transverse colon and nonbleeding internal hemorrhoids.  No specimen collected.  Eventually, hemoglobin stabilized and bleeding stopped.  Diet advanced.  GI signed off.     Patient with acute left knee pain and swelling.  X-ray concerning for severe osteoarthritis and moderate effusion.  Orthopedic  surgery consulted.  Therapy recommended return to ALF with home health    See individual problem list below for more.   Problems addressed during this hospitalization ABLA due to lower GI bleed/diverticular bleed: Seems to have resolved.  Received 4 units during this hospitalization.  CT angio colonoscopy as above.  Tolerating soft diet.  Hemoglobin remained stable.  Cleared for discharge by GI. Recent Labs    04/03/24 0347 04/03/24 1324 04/03/24 2029 04/04/24 0711 04/04/24 1441 04/05/24 0341 04/05/24 1859 04/06/24 0426 04/07/24 0421 04/08/24 0446  HGB 11.8* 9.3* 7.9* 8.7* 8.1* 7.2* 9.3* 9.1* 10.0* 9.6*  - Recommend holding aspirin  for 1 week   Acute on chronic left knee pain: X-ray showed severe osteoarthritis with moderate effusion.  Uric acid within normal.  Low suspicion for acute infection.  Patient takes Voltaren  tablets at home which has been on hold. -S/p arthrocentesis with removal of 80 cc synovial fluid and steroid injection by Dr. Vernetta, on 1/7.  Pain and swelling improved significantly. -Tylenol  and topical Voltaren  gel for pain control - Outpatient follow-up with orthopedic surgery as above   AKI: Mild and improved. - Recheck renal function in 1 week   Essential hypertension: Normotensive -Continue home Toprol -XL and amlodipine .   Hyperlipidemia - Continue home meds.   Hyperkalemia: Resolved.   BPH - Continue home regimen Proscar , Flomax .    Heart murmur: TTE with LVEF of 55 to 60%, G1 DD, mild MVR, mild AVR    Body mass index is 26.57 kg/m.           Consultations: Gastroenterology Orthopedic surgery  Time spent 35  minutes  Vital signs Vitals:   04/07/24 2155 04/07/24 2208 04/08/24 0034 04/08/24 0451  BP: (!) 119/57   113/79  Pulse: 64   63  Temp: 99.2 F (37.3 C)   98.9 F (37.2 C)  Resp: 20 18 10    Height:      Weight:      SpO2: 97%   99%  TempSrc: Oral   Oral  BMI (Calculated):         Discharge exam  GENERAL: No  apparent distress.  Nontoxic. HEENT: MMM.  Vision and hearing grossly intact.  NECK: Supple.  No apparent JVD.  RESP:  No IWOB.  Fair aeration bilaterally. CVS:  RRR. Heart sounds normal.  ABD/GI/GU: BS+. Abd soft, NTND.  MSK/EXT:  Moves extremities. No apparent deformity. No edema.  SKIN: no apparent skin lesion or wound NEURO: Awake and alert. Oriented appropriately.  No apparent focal neuro deficit. PSYCH: Calm. Normal affect.   Discharge Instructions Discharge Instructions     Discharge instructions   Complete by: As directed    It has been a pleasure taking care of you!  You were hospitalized due to rectal bleed that seems to have stopped.  We recommend holding your aspirin  for one week.  Follow-up with your primary care doctor in 1 to 2 weeks or sooner if needed.  In regards to your knee pain, you had a steroid injection by orthopedic surgery.  Follow-up with them outpatient.   Take care,   Increase activity slowly   Complete by: As directed    Increase activity slowly   Complete by: As directed       Allergies as of 04/08/2024   No Known Allergies      Medication List     PAUSE taking these medications    aspirin  EC 81 MG tablet Wait to take this until: April 14, 2024 Take 81 mg by mouth daily. Swallow whole.       STOP taking these medications    diclofenac  75 MG EC tablet Commonly known as: VOLTAREN    Fluad  Quadrivalent 0.5 ML injection Generic drug: influenza vaccine adjuvanted   HYDROcodone -acetaminophen  5-325 MG tablet Commonly known as: NORCO/VICODIN   methocarbamol  500 MG tablet Commonly known as: ROBAXIN        TAKE these medications    acetaminophen  325 MG tablet Commonly known as: TYLENOL  Take 650 mg by mouth as needed.   amLODipine  10 MG tablet Commonly known as: NORVASC  Take 1 tablet (10 mg total) by mouth daily.   atorvastatin  40 MG tablet Commonly known as: LIPITOR Take 1 tablet (40 mg total) by mouth daily.    diclofenac  Sodium 1 % Gel Commonly known as: VOLTAREN  Apply 4 g topically 4 (four) times daily.   docusate sodium  100 MG capsule Commonly known as: COLACE Take 1 capsule (100 mg total) by mouth 2 (two) times daily.   finasteride  5 MG tablet Commonly known as: PROSCAR  Take 1 tablet (5 mg total) by mouth daily.   metoprolol  succinate 100 MG 24 hr tablet Commonly known as: TOPROL -XL Take 1 tablet (100 mg total) by mouth daily. Take with or immediately following a meal.   Moderna COVID-19 Bival Booster 50 MCG/0.5ML injection Generic drug: COVID-19 mRNA bivalent vaccine (Moderna) Inject into the muscle.   Moderna COVID-19 Vaccine  100 MCG/0.5ML injection Generic drug: COVID-19 mRNA vaccine (Moderna) Inject into the muscle.   polyethylene glycol 17 g packet Commonly known as: MIRALAX  / GLYCOLAX  Take 17 g by mouth  daily as needed for mild constipation.   senna-docusate 8.6-50 MG tablet Commonly known as: Senokot-S Take 1 tablet by mouth at bedtime as needed for mild constipation.   tamsulosin  0.4 MG Caps capsule Commonly known as: FLOMAX  Take 1 capsule (0.4 mg total) by mouth daily.         Procedures/Studies: 1/3-colonoscopy - Old blood in the entire examined colon. - Diverticulosis in the sigmoid colon, in the descending colon and in the transverse colon. - Non-bleeding internal hemorrhoids. - No specimens collected. Based on clinical presentation and colonoscopy findings, all consistent with self-limiting diverticular bleed. Suspect left-sided diverticular bleed based on burden of diverticulosis and exam findings. The entire colon was lavaged to clear without recurrence of active bleeding and no bleeding source identified 1/7-left knee arthrocentesis with removal of 80 cc synovial fluid and steroid injection   DG Knee 1-2 Views Left Result Date: 04/07/2024 CLINICAL DATA:  Left knee pain EXAM: LEFT KNEE - 1-2 VIEW COMPARISON:  None Available. FINDINGS: No fracture or  dislocation is noted. Moderate size suprapatellar joint effusion is noted. Vascular calcifications are noted. Severe narrowing and degenerative changes seen involving medial and lateral joint spaces. Moderate degenerative change in osteophyte formation is seen involving patellofemoral space. IMPRESSION: Severe degenerative joint disease. Moderate size suprapatellar joint effusion. No fracture or dislocation. Electronically Signed   By: Lynwood Landy Raddle M.D.   On: 04/07/2024 12:05   ECHOCARDIOGRAM COMPLETE Result Date: 04/05/2024    ECHOCARDIOGRAM REPORT   Patient Name:   Jeremy Sawyer [facility] Date of Exam: 04/05/2024 Medical Rec #:  [id number]             Height:       70.0 in Accession #:    [id number]            Weight:       185.2 lb Date of Birth:  04-Nov-1934              BSA:          2.020 m Patient Age:    89 years              BP:           112/61 mmHg Patient Gender: M                     HR:           74 bpm. Exam Location:  Inpatient Procedure: 2D Echo, Cardiac Doppler and Color Doppler (Both Spectral and Color            Flow Doppler were utilized during procedure). Indications:    Murmur R01.1  History:        Patient has no prior history of Echocardiogram examinations.                 CAD; Risk Factors:Hypertension.  Sonographer:    Sydnee Wilson RDCS Referring Phys: [id number] DANIEL V THOMPSON IMPRESSIONS  1. Left ventricular ejection fraction, by estimation, is 55 to 60%. The left ventricle has normal function. The left ventricle has no regional wall motion abnormalities. Left ventricular diastolic parameters are consistent with Grade I diastolic dysfunction (impaired relaxation).  2. Right ventricular systolic function is normal. The right ventricular size is normal.  3. Left atrial size was mildly dilated.  4. The mitral valve is degenerative. Mild mitral valve regurgitation. No evidence of mitral stenosis.  5. The aortic valve is calcified. Aortic valve regurgitation is mild. No aortic stenosis  is  present. Aortic valve mean gradient measures 8.0 mmHg. Aortic valve Vmax measures 1.96 m/s.  6. The inferior vena cava is normal in size with greater than 50% respiratory variability, suggesting right atrial pressure of 3 mmHg. FINDINGS  Left Ventricle: Left ventricular ejection fraction, by estimation, is 55 to 60%. The left ventricle has normal function. The left ventricle has no regional wall motion abnormalities. The left ventricular internal cavity size was normal in size. There is  no left ventricular hypertrophy. Left ventricular diastolic parameters are consistent with Grade I diastolic dysfunction (impaired relaxation). Right Ventricle: The right ventricular size is normal. No increase in right ventricular wall thickness. Right ventricular systolic function is normal. Left Atrium: Left atrial size was mildly dilated. Right Atrium: Right atrial size was normal in size. Pericardium: There is no evidence of pericardial effusion. Mitral Valve: The mitral valve is degenerative in appearance. Mild mitral annular calcification. Mild mitral valve regurgitation, with anteriorly-directed jet. No evidence of mitral valve stenosis. Tricuspid Valve: The tricuspid valve is normal in structure. Tricuspid valve regurgitation is not demonstrated. No evidence of tricuspid stenosis. Aortic Valve: The aortic valve is calcified. Aortic valve regurgitation is mild. Aortic regurgitation PHT measures 731 msec. No aortic stenosis is present. Aortic valve mean gradient measures 8.0 mmHg. Aortic valve peak gradient measures 15.4 mmHg. Aortic valve area, by VTI measures 1.90 cm. Pulmonic Valve: The pulmonic valve was normal in structure. Pulmonic valve regurgitation is not visualized. No evidence of pulmonic stenosis. Aorta: The aortic root is normal in size and structure. Venous: The inferior vena cava is normal in size with greater than 50% respiratory variability, suggesting right atrial pressure of 3 mmHg. IAS/Shunts: No atrial  level shunt detected by color flow Doppler.  LEFT VENTRICLE PLAX 2D LVIDd:         5.30 cm      Diastology LVIDs:         3.80 cm      LV e' medial:    10.60 cm/s LV PW:         1.30 cm      LV E/e' medial:  7.6 LV IVS:        0.80 cm      LV e' lateral:   10.40 cm/s LVOT diam:     2.00 cm      LV E/e' lateral: 7.8 LV SV:         66 LV SV Index:   33 LVOT Area:     3.14 cm  LV Volumes (MOD) LV vol d, MOD A2C: 152.0 ml LV vol d, MOD A4C: 124.0 ml LV vol s, MOD A2C: 61.1 ml LV vol s, MOD A4C: 53.6 ml LV SV MOD A2C:     90.9 ml LV SV MOD A4C:     124.0 ml LV SV MOD BP:      78.6 ml RIGHT VENTRICLE RV S prime:     23.00 cm/s TAPSE (M-mode): 2.6 cm LEFT ATRIUM             Index        RIGHT ATRIUM           Index LA diam:        3.90 cm 1.93 cm/m   RA Area:     15.10 cm LA Vol (A2C):   76.7 ml 37.97 ml/m  RA Volume:   28.80 ml  14.26 ml/m LA Vol (A4C):   43.6 ml 21.58 ml/m LA Biplane Vol: 56.8 ml  28.12 ml/m  AORTIC VALVE AV Area (Vmax):    1.80 cm AV Area (Vmean):   1.86 cm AV Area (VTI):     1.90 cm AV Vmax:           196.25 cm/s AV Vmean:          132.000 cm/s AV VTI:            0.350 m AV Peak Grad:      15.4 mmHg AV Mean Grad:      8.0 mmHg LVOT Vmax:         112.33 cm/s LVOT Vmean:        78.200 cm/s LVOT VTI:          0.211 m LVOT/AV VTI ratio: 0.60 AI PHT:            731 msec  AORTA Ao Root diam: 3.50 cm Ao Asc diam:  3.90 cm MITRAL VALVE                TRICUSPID VALVE MV Area (PHT): 1.46 cm     TR Peak grad:   21.3 mmHg MV E velocity: 81.00 cm/s   TR Vmax:        231.00 cm/s MV A velocity: 114.00 cm/s MV E/A ratio:  0.71         SHUNTS                             Systemic VTI:  0.21 m                             Systemic Diam: 2.00 cm Oneil Parchment MD Electronically signed by Oneil Parchment MD Signature Date/Time: 04/05/2024/4:31:42 PM    Final    CT ANGIO GI BLEED Result Date: 04/02/2024 CLINICAL DATA:  Right red blood in stool. EXAM: CTA ABDOMEN AND PELVIS WITHOUT AND WITH CONTRAST TECHNIQUE:  Multidetector CT imaging of the abdomen and pelvis was performed using the standard protocol during bolus administration of intravenous contrast. Multiplanar reconstructed images and MIPs were obtained and reviewed to evaluate the vascular anatomy. RADIATION DOSE REDUCTION: This exam was performed according to the departmental dose-optimization program which includes automated exposure control, adjustment of the mA and/or kV according to patient size and/or use of iterative reconstruction technique. CONTRAST:  100mL OMNIPAQUE  IOHEXOL  350 MG/ML SOLN COMPARISON:  None Available. FINDINGS: VASCULAR Aorta: Aortic atherosclerosis without aneurysm or dissection. Celiac: Patent without evidence of aneurysm, dissection, vasculitis or significant stenosis. SMA: Patent without evidence of aneurysm, dissection, vasculitis or significant stenosis. Renals: Both renal arteries are patent without evidence of aneurysm, dissection, vasculitis, fibromuscular dysplasia or significant stenosis. IMA: Patent without evidence of aneurysm, dissection, vasculitis or significant stenosis. Inflow: Patent without evidence of aneurysm, dissection, vasculitis or significant stenosis. Proximal Outflow: Bilateral common femoral and visualized portions of the superficial and profunda femoral arteries are patent without evidence of aneurysm, dissection, vasculitis or significant stenosis. Veins: No obvious venous abnormality within the limitations of this arterial phase study. Review of the MIP images confirms the above findings. NON-VASCULAR Lower chest: No acute abnormality. Hepatobiliary: No focal liver abnormality is seen. No gallstones, gallbladder wall thickening, or biliary dilatation. Pancreas: Unremarkable. No pancreatic ductal dilatation or surrounding inflammatory changes. Spleen: Normal in size without focal abnormality. Adrenals/Urinary Tract: Adrenal glands appear normal. Bilateral renal cysts are noted. Bilateral nephrolithiasis is  noted. Severe right hydronephrosis is noted without  ureteral dilatation or obstructing calculus, suggesting chronic UPJ obstruction. Urinary bladder is unremarkable. Stomach/Bowel: Stomach is within normal limits. Appendix appears normal. No evidence of bowel wall thickening, distention, or inflammatory changes. Diverticulosis of descending and sigmoid colon without inflammation. No definite evidence active contrast extravasation to suggest gastrointestinal bleeding. Lymphatic: No significant adenopathy is noted. Reproductive: Prostate is unremarkable. Other: No abdominal wall hernia or abnormality. No abdominopelvic ascites. Musculoskeletal: Status post bilateral hip arthroplasties. No acute osseous abnormality is noted. IMPRESSION: 1. Aortic atherosclerosis without aneurysm or dissection. 2. No definite evidence of active gastrointestinal bleeding. 3. Diverticulosis of descending and sigmoid colon without inflammation. 4. Bilateral nephrolithiasis. 5. Severe right hydronephrosis is noted without ureteral dilatation or obstructing calculus, suggesting chronic UPJ obstruction. Aortic Atherosclerosis (ICD10-I70.0). Electronically Signed   By: Lynwood Landy Raddle M.D.   On: 04/02/2024 12:33       The results of significant diagnostics from this hospitalization (including imaging, microbiology, ancillary and laboratory) are listed below for reference.     Microbiology: No results found for this or any previous visit (from the past 240 hours).   Labs:  CBC: Recent Labs  Lab 04/04/24 0711 04/04/24 1441 04/05/24 0341 04/05/24 1859 04/06/24 0426 04/07/24 0421 04/08/24 0446  WBC 10.0   < > 6.6 8.4 8.1 10.5 9.3  NEUTROABS 8.2*  --  4.2  --   --   --   --   HGB 8.7*   < > 7.2* 9.3* 9.1* 10.0* 9.6*  HCT 25.8*   < > 21.7* 27.1* 26.4* 29.6* 28.2*  MCV 91.8   < > 92.3 90.0 90.1 91.9 91.6  PLT 92*   < > 85* 94* 100* 115* 131*   < > = values in this interval not displayed.   BMP &GFR Recent Labs  Lab  04/04/24 0711 04/05/24 0341 04/06/24 0426 04/07/24 0421 04/08/24 0446  NA 144 140 138 138 133*  K 4.0 4.0 3.6 4.2 4.4  CL 114* 112* 107 103 99  CO2 23 22 24 26 25   GLUCOSE 108* 101* 96 112* 136*  BUN 20 19 12 11 18   CREATININE 1.35* 1.34* 1.15 1.28* 1.32*  CALCIUM  7.9* 7.7* 8.1* 8.7* 8.3*  MG 2.3  --   --   --  2.1  PHOS  --   --   --   --  4.2   Estimated Creatinine Clearance: 40.7 mL/min (A) (by C-G formula based on SCr of 1.32 mg/dL (H)). Liver & Pancreas: Recent Labs  Lab 04/02/24 0830 04/08/24 0446  AST 24  --   ALT 24  --   ALKPHOS 75  --   BILITOT 0.7  --   PROT 6.4*  --   ALBUMIN 3.8 3.0*   No results for input(s): LIPASE, AMYLASE in the last 168 hours. No results for input(s): AMMONIA in the last 168 hours. Diabetic: No results for input(s): HGBA1C in the last 72 hours. Recent Labs  Lab 04/03/24 0626  GLUCAP 143*   Cardiac Enzymes: No results for input(s): CKTOTAL, CKMB, CKMBINDEX, TROPONINI in the last 168 hours. No results for input(s): PROBNP in the last 8760 hours. Coagulation Profile: No results for input(s): INR, PROTIME in the last 168 hours. Thyroid  Function Tests: No results for input(s): TSH, T4TOTAL, FREET4, T3FREE, THYROIDAB in the last 72 hours. Lipid Profile: No results for input(s): CHOL, HDL, LDLCALC, TRIG, CHOLHDL, LDLDIRECT in the last 72 hours. Anemia Panel: No results for input(s): VITAMINB12, FOLATE, FERRITIN, TIBC, IRON, RETICCTPCT in the last 72  hours. Urine analysis:    Component Value Date/Time   COLORURINE YELLOW 04/04/2024 1643   APPEARANCEUR HAZY (A) 04/04/2024 1643   LABSPEC 1.018 04/04/2024 1643   PHURINE 5.0 04/04/2024 1643   GLUCOSEU NEGATIVE 04/04/2024 1643   HGBUR NEGATIVE 04/04/2024 1643   BILIRUBINUR NEGATIVE 04/04/2024 1643   KETONESUR NEGATIVE 04/04/2024 1643   PROTEINUR NEGATIVE 04/04/2024 1643   UROBILINOGEN 0.2 05/09/2011 1345   NITRITE NEGATIVE  04/04/2024 1643   LEUKOCYTESUR NEGATIVE 04/04/2024 1643   Sepsis Labs: Invalid input(s): PROCALCITONIN, LACTICIDVEN   SIGNED:  Eldar Robitaille T Carlee Tesfaye, MD  [facility] 04/08/2024, 8:44 AM   "

## 2024-04-08 NOTE — Care Management Important Message (Signed)
 Important Message  Patient Details IM Letter given. Name: Jeremy Sawyer MRN: [id number] Date of Birth: 11-26-1934   Important Message Given:  Yes - Medicare IM     Jeremy Sawyer 04/08/2024, 9:07 AM

## 2024-04-08 NOTE — Progress Notes (Signed)
 Discharge medications delivered to patient at the bedside in a secure bag.

## 2024-04-08 NOTE — TOC Transition Note (Signed)
 Transition of Care [facility]) - Discharge Note   Patient Details  Name: Jeremy Sawyer MRN: [id number] Date of Birth: [birth year]  Transition of Care [facility]) CM/SW Contact:  Tawni CHRISTELLA Eva, LCSW Phone Number: 04/08/2024, 9:16 AM   Clinical Narrative:     Pt to d/c to [facility] short term rehab side. Pt's daughter to provided transport. FL2 and d/c summary will be fax over via hub . No further ICM needs, ICM sign off.   Final next level of care: Skilled Nursing Facility Barriers to Discharge: Barriers Resolved   Patient Goals and CMS Choice Patient states their goals for this hospitalization and ongoing recovery are:: SNF to get stonger     [place] ownership interest in [facility].provided to:: Patient    Discharge Placement              Patient chooses bed at: [facility] Patient to be transferred to facility by: daughter Name of family member notified: [name],[name]  Daughter, Emergency Contact  [phone] (Home Phone Patient and family notified of of transfer: 04/08/24  Discharge Plan and Services Additional resources added to the After Visit Summary for     Discharge Planning Services: CM Consult            DME Arranged: N/A DME Agency: NA       HH Arranged: NA HH Agency: NA        Social Drivers of Health (SDOH) Interventions SDOH Screenings   Food Insecurity: No Food Insecurity (04/03/2024)  Housing: Low Risk (04/03/2024)  Transportation Needs: No Transportation Needs (04/03/2024)  Utilities: Not At Risk (04/03/2024)  Social Connections: Moderately Isolated (04/02/2024)  Tobacco Use: Medium Risk (04/03/2024)     Readmission Risk Interventions     No data to display

## 2024-04-09 ENCOUNTER — Non-Acute Institutional Stay (SKILLED_NURSING_FACILITY): Payer: Self-pay | Admitting: Adult Health

## 2024-04-09 DIAGNOSIS — R011 Cardiac murmur, unspecified: Secondary | ICD-10-CM

## 2024-04-09 DIAGNOSIS — I10 Essential (primary) hypertension: Secondary | ICD-10-CM | POA: Diagnosis not present

## 2024-04-09 DIAGNOSIS — N179 Acute kidney failure, unspecified: Secondary | ICD-10-CM

## 2024-04-09 DIAGNOSIS — E785 Hyperlipidemia, unspecified: Secondary | ICD-10-CM

## 2024-04-09 DIAGNOSIS — D62 Acute posthemorrhagic anemia: Secondary | ICD-10-CM

## 2024-04-09 DIAGNOSIS — K5731 Diverticulosis of large intestine without perforation or abscess with bleeding: Secondary | ICD-10-CM | POA: Diagnosis not present

## 2024-04-10 ENCOUNTER — Encounter: Payer: Self-pay | Admitting: Adult Health

## 2024-04-10 NOTE — Progress Notes (Signed)
 " Location:  Medical Illustrator of Service:  SNF (31) Provider:   Bari America, ANP [facility] [phone]   [name], MD  Patient Care Team: [name], MD as PCP - General (Internal Medicine)  Extended Emergency Contact Information Primary Emergency Contact: [name],[name] Home Phone: [phone] Relation: Daughter  Code Status:  DNR Goals of care: Advanced Directive information    04/03/2024    8:58 AM  Advanced Directives  Does Patient Have a Medical Advance Directive? No  Would patient like information on creating a medical advance directive? No - Patient declined     Chief Complaint  Patient presents with   Follow-up    Hospital stay    HPI:  The patient is an 89 year old  who presents for a hospital follow at [facility] after a hospital stay with acute blood loss anemia due to a lower GI bleed.  Lower gastrointestinal bleeding and acute blood loss anemia - Hospitalized January 2-8, 2026 for acute blood loss anemia secondary to lower GI bleed after 12 hours of hematochezia - Baseline hemoglobin approximately 13 g/dL, dropped to 7.2 g/dL on admission - CT angiography showed no active extravasation - Received four units of packed red blood cells during hospitalization - Colonoscopy on January 3 showed old blood, diverticulosis, and a hemorrhoid without active bleeding suspected diverticular bleed - Since transfer to [facility], had one stool with a small amount of blood, then no further bleeding - Tolerating a regular diet - No abdominal pain  Left knee pain and swelling - Developed acute left knee pain and swelling during hospitalization - X-ray demonstrated osteoarthritis and an effusion - Arthrocentesis removed 80 cc of synovial fluid - Received intra-articular steroid injection - Oral Voltaren  discontinued due to GI bleed; using topical Voltaren  gel for pain  Hypertension - Controlled with Toprol  and  amlodipine   Acute kidney injury - Developed acute kidney injury during hospitalization, now improved - BUN at discharge: 18 - Creatinine at discharge: 1.32  Cardiac history - Heart murmur present - Transthoracic echocardiogram: ejection fraction 55-60%, grade 1 diastolic dysfunction, mild mitral and aortic regurgitation  Benign prostatic hyperplasia - Treated with Proscar   Hyperlipidemia - Treated with Lipitor  Past Medical History:  Diagnosis Date   Arthritis    Bruises easily    CAD (coronary artery disease)    Colon polyps    Groin pain    Right   Hypercholesterolemia    takes Lipitor daily   Hypertension    takes Amlodipine ,Ramipril ,and Metoprolol  daily   Impaired hearing    Joint pain    Joint swelling    Myocardial infarction [facility])    2003   Osteoarthritis    Right hip   Sleep apnea    uses CPAP;sleep study done > 29yrs ago   Past Surgical History:  Procedure Laterality Date   CARDIAC CATHETERIZATION  >23yrs ago   CARTILAGE SURGERY  1990   Right knee   COLONOSCOPY     COLONOSCOPY N/A 04/03/2024   Procedure: COLONOSCOPY;  Surgeon: San Sandor GAILS, DO;  Location: [facility];  Service: Gastroenterology;  Laterality: N/A;   CORONARY ANGIOPLASTY     1 stent   KNEE SURGERY     bilateral   TOTAL HIP ARTHROPLASTY  05/15/2011   Procedure: TOTAL HIP ARTHROPLASTY;  Surgeon: Dempsey JINNY Sensor, MD;  Location: [facility] OR;  Service: Orthopedics;  Laterality: Right;  DEPUY/SROM/PINNACLE   TOTAL HIP ARTHROPLASTY Left 03/09/2018   Procedure: TOTAL HIP  ARTHROPLASTY ANTERIOR APPROACH LEFT HIP Aspiration Fluid LEFT KNEE;  Surgeon: Yvone Rush, MD;  Location: [facility] OR;  Service: Orthopedics;  Laterality: Left;    Allergies[1]  Outpatient Encounter Medications as of 04/09/2024  Medication Sig   acetaminophen  (TYLENOL ) 325 MG tablet Take 650 mg by mouth as needed. (Patient not taking: Reported on 04/02/2024)   amLODipine  (NORVASC ) 10 MG tablet Take 1 tablet (10 mg total) by mouth daily.    [Paused] aspirin  EC 81 MG tablet Take 81 mg by mouth daily. Swallow whole.   atorvastatin  (LIPITOR) 40 MG tablet Take 1 tablet (40 mg total) by mouth daily.   COVID-19 mRNA bivalent vaccine, Moderna, (MODERNA COVID-19 BIVAL BOOSTER) 50 MCG/0.5ML injection Inject into the muscle. (Patient not taking: Reported on 04/02/2024)   COVID-19 mRNA vaccine, Moderna, 100 MCG/0.5ML injection Inject into the muscle. (Patient not taking: Reported on 04/02/2024)   diclofenac  Sodium (VOLTAREN ) 1 % GEL Apply 4 g topically 4 (four) times daily.   docusate sodium  (COLACE) 100 MG capsule Take 1 capsule (100 mg total) by mouth 2 (two) times daily.   finasteride  (PROSCAR ) 5 MG tablet Take 1 tablet (5 mg total) by mouth daily.   metoprolol  succinate (TOPROL -XL) 100 MG 24 hr tablet Take 1 tablet (100 mg total) by mouth daily. Take with or immediately following a meal.   polyethylene glycol (MIRALAX  / GLYCOLAX ) packet Take 17 g by mouth daily as needed for mild constipation. (Patient not taking: Reported on 04/02/2024)   senna-docusate (SENOKOT-S) 8.6-50 MG tablet Take 1 tablet by mouth at bedtime as needed for mild constipation. (Patient not taking: Reported on 04/02/2024)   tamsulosin  (FLOMAX ) 0.4 MG CAPS capsule Take 1 capsule (0.4 mg total) by mouth daily.   No facility-administered encounter medications on file as of 04/09/2024.    Review of Systems  Constitutional:  Negative for activity change, appetite change, chills, diaphoresis, fatigue and fever.  HENT:  Negative for congestion.   Respiratory:  Negative for cough, shortness of breath and wheezing.   Cardiovascular:  Negative for chest pain and leg swelling.  Gastrointestinal:  Negative for abdominal distention, abdominal pain, constipation, diarrhea, nausea and vomiting.  Genitourinary:  Negative for difficulty urinating, dysuria and urgency.  Musculoskeletal:  Positive for arthralgias. Negative for back pain, gait problem, myalgias and neck pain.  Skin:  Negative  for rash.  Neurological:  Negative for dizziness and weakness.  Psychiatric/Behavioral:  Negative for confusion.     Immunization History  Administered Date(s) Administered   INFLUENZA, HIGH DOSE SEASONAL PF 12/26/2016   Influenza-Unspecified 11/21/2017   Moderna Covid-19 Vaccine  Bivalent Booster 4yrs & up 01/02/2021   Moderna SARS-COV2 Booster Vaccination 07/12/2020   Pneumococcal Conjugate-13 07/26/2013   Pneumococcal Polysaccharide-23 04/01/2005   Td 12/03/2007   Zoster, Live 06/08/2008   Pertinent  Health Maintenance Due  Topic Date Due   Colonoscopy  04/03/2029   Influenza Vaccine  Completed      03/11/2018   10:15 AM 03/11/2018    8:00 PM 03/12/2018    8:33 AM 03/12/2018    9:30 PM 03/13/2018    8:21 AM  Fall Risk  (RETIRED) Patient Fall Risk Level Moderate fall risk  Moderate fall risk  Moderate fall risk  Moderate fall risk  Moderate fall risk      Data saved with a previous flowsheet row definition   Functional Status Survey:    Vitals:   04/10/24 0750  BP: (!) 141/72  Pulse: 63  Resp: 20  Temp: 97.7 F (36.5  C)  SpO2: 96%  Weight: 179 lb 6.4 oz (81.4 kg)   Body mass index is 25.74 kg/m. Physical Exam Vitals and nursing note reviewed.  Constitutional:      Appearance: Normal appearance.  HENT:     Head: Normocephalic and atraumatic.  Cardiovascular:     Rate and Rhythm: Normal rate and regular rhythm.     Heart sounds: Murmur heard.  Pulmonary:     Effort: Pulmonary effort is normal. No respiratory distress.     Breath sounds: Normal breath sounds. No wheezing.  Abdominal:     General: Bowel sounds are normal. There is no distension.     Palpations: Abdomen is soft.     Tenderness: There is no abdominal tenderness.  Musculoskeletal:     Cervical back: Normal range of motion. No rigidity.     Right lower leg: No edema.     Left lower leg: No edema.  Lymphadenopathy:     Cervical: No cervical adenopathy.  Skin:    General: Skin is warm  and dry.     Findings: Bruising (thin frail skin with bruising to arms and brown staining to legs) present.  Neurological:     General: No focal deficit present.     Mental Status: He is alert and oriented to person, place, and time. Mental status is at baseline.  Psychiatric:        Mood and Affect: Mood normal.     Labs reviewed: Recent Labs    04/04/24 0711 04/05/24 0341 04/06/24 0426 04/07/24 0421 04/08/24 0446  NA 144   < > 138 138 133*  K 4.0   < > 3.6 4.2 4.4  CL 114*   < > 107 103 99  CO2 23   < > 24 26 25   GLUCOSE 108*   < > 96 112* 136*  BUN 20   < > 12 11 18   CREATININE 1.35*   < > 1.15 1.28* 1.32*  CALCIUM  7.9*   < > 8.1* 8.7* 8.3*  MG 2.3  --   --   --  2.1  PHOS  --   --   --   --  4.2   < > = values in this interval not displayed.   Recent Labs    04/02/24 0830 04/08/24 0446  AST 24  --   ALT 24  --   ALKPHOS 75  --   BILITOT 0.7  --   PROT 6.4*  --   ALBUMIN 3.8 3.0*   Recent Labs    04/04/24 0711 04/04/24 1441 04/05/24 0341 04/05/24 1859 04/06/24 0426 04/07/24 0421 04/08/24 0446  WBC 10.0   < > 6.6   < > 8.1 10.5 9.3  NEUTROABS 8.2*  --  4.2  --   --   --   --   HGB 8.7*   < > 7.2*   < > 9.1* 10.0* 9.6*  HCT 25.8*   < > 21.7*   < > 26.4* 29.6* 28.2*  MCV 91.8   < > 92.3   < > 90.1 91.9 91.6  PLT 92*   < > 85*   < > 100* 115* 131*   < > = values in this interval not displayed.   No results found for: TSH No results found for: HGBA1C No results found for: CHOL, HDL, LDLCALC, LDLDIRECT, TRIG, CHOLHDL  Significant Diagnostic Results in last 30 days:  DG Knee 1-2 Views Left Result Date: 04/07/2024 CLINICAL DATA:  Left  knee pain EXAM: LEFT KNEE - 1-2 VIEW COMPARISON:  None Available. FINDINGS: No fracture or dislocation is noted. Moderate size suprapatellar joint effusion is noted. Vascular calcifications are noted. Severe narrowing and degenerative changes seen involving medial and lateral joint spaces. Moderate degenerative  change in osteophyte formation is seen involving patellofemoral space. IMPRESSION: Severe degenerative joint disease. Moderate size suprapatellar joint effusion. No fracture or dislocation. Electronically Signed   By: Lynwood Landy Raddle M.D.   On: 04/07/2024 12:05   ECHOCARDIOGRAM COMPLETE Result Date: 04/05/2024    ECHOCARDIOGRAM REPORT   Patient Name:   Jeremy Sawyer [facility] Date of Exam: 04/05/2024 Medical Rec #:  [id number]             Height:       70.0 in Accession #:    [id number]            Weight:       185.2 lb Date of Birth:  [birth year]              BSA:          2.020 m Patient Age:    89 years              BP:           112/61 mmHg Patient Gender: M                     HR:           74 bpm. Exam Location:  Inpatient Procedure: 2D Echo, Cardiac Doppler and Color Doppler (Both Spectral and Color            Flow Doppler were utilized during procedure). Indications:    Murmur R01.1  History:        Patient has no prior history of Echocardiogram examinations.                 CAD; Risk Factors:Hypertension.  Sonographer:    Sydnee Wilson RDCS Referring Phys: [id number] DANIEL V THOMPSON IMPRESSIONS  1. Left ventricular ejection fraction, by estimation, is 55 to 60%. The left ventricle has normal function. The left ventricle has no regional wall motion abnormalities. Left ventricular diastolic parameters are consistent with Grade I diastolic dysfunction (impaired relaxation).  2. Right ventricular systolic function is normal. The right ventricular size is normal.  3. Left atrial size was mildly dilated.  4. The mitral valve is degenerative. Mild mitral valve regurgitation. No evidence of mitral stenosis.  5. The aortic valve is calcified. Aortic valve regurgitation is mild. No aortic stenosis is present. Aortic valve mean gradient measures 8.0 mmHg. Aortic valve Vmax measures 1.96 m/s.  6. The inferior vena cava is normal in size with greater than 50% respiratory variability, suggesting right atrial pressure of 3 mmHg.  FINDINGS  Left Ventricle: Left ventricular ejection fraction, by estimation, is 55 to 60%. The left ventricle has normal function. The left ventricle has no regional wall motion abnormalities. The left ventricular internal cavity size was normal in size. There is  no left ventricular hypertrophy. Left ventricular diastolic parameters are consistent with Grade I diastolic dysfunction (impaired relaxation). Right Ventricle: The right ventricular size is normal. No increase in right ventricular wall thickness. Right ventricular systolic function is normal. Left Atrium: Left atrial size was mildly dilated. Right Atrium: Right atrial size was normal in size. Pericardium: There is no evidence of pericardial effusion. Mitral Valve: The mitral valve is degenerative in appearance. Mild mitral annular calcification. Mild  mitral valve regurgitation, with anteriorly-directed jet. No evidence of mitral valve stenosis. Tricuspid Valve: The tricuspid valve is normal in structure. Tricuspid valve regurgitation is not demonstrated. No evidence of tricuspid stenosis. Aortic Valve: The aortic valve is calcified. Aortic valve regurgitation is mild. Aortic regurgitation PHT measures 731 msec. No aortic stenosis is present. Aortic valve mean gradient measures 8.0 mmHg. Aortic valve peak gradient measures 15.4 mmHg. Aortic valve area, by VTI measures 1.90 cm. Pulmonic Valve: The pulmonic valve was normal in structure. Pulmonic valve regurgitation is not visualized. No evidence of pulmonic stenosis. Aorta: The aortic root is normal in size and structure. Venous: The inferior vena cava is normal in size with greater than 50% respiratory variability, suggesting right atrial pressure of 3 mmHg. IAS/Shunts: No atrial level shunt detected by color flow Doppler.  LEFT VENTRICLE PLAX 2D LVIDd:         5.30 cm      Diastology LVIDs:         3.80 cm      LV e' medial:    10.60 cm/s LV PW:         1.30 cm      LV E/e' medial:  7.6 LV IVS:         0.80 cm      LV e' lateral:   10.40 cm/s LVOT diam:     2.00 cm      LV E/e' lateral: 7.8 LV SV:         66 LV SV Index:   33 LVOT Area:     3.14 cm  LV Volumes (MOD) LV vol d, MOD A2C: 152.0 ml LV vol d, MOD A4C: 124.0 ml LV vol s, MOD A2C: 61.1 ml LV vol s, MOD A4C: 53.6 ml LV SV MOD A2C:     90.9 ml LV SV MOD A4C:     124.0 ml LV SV MOD BP:      78.6 ml RIGHT VENTRICLE RV S prime:     23.00 cm/s TAPSE (M-mode): 2.6 cm LEFT ATRIUM             Index        RIGHT ATRIUM           Index LA diam:        3.90 cm 1.93 cm/m   RA Area:     15.10 cm LA Vol (A2C):   76.7 ml 37.97 ml/m  RA Volume:   28.80 ml  14.26 ml/m LA Vol (A4C):   43.6 ml 21.58 ml/m LA Biplane Vol: 56.8 ml 28.12 ml/m  AORTIC VALVE AV Area (Vmax):    1.80 cm AV Area (Vmean):   1.86 cm AV Area (VTI):     1.90 cm AV Vmax:           196.25 cm/s AV Vmean:          132.000 cm/s AV VTI:            0.350 m AV Peak Grad:      15.4 mmHg AV Mean Grad:      8.0 mmHg LVOT Vmax:         112.33 cm/s LVOT Vmean:        78.200 cm/s LVOT VTI:          0.211 m LVOT/AV VTI ratio: 0.60 AI PHT:            731 msec  AORTA Ao Root diam: 3.50 cm Ao Asc diam:  3.90  cm MITRAL VALVE                TRICUSPID VALVE MV Area (PHT): 1.46 cm     TR Peak grad:   21.3 mmHg MV E velocity: 81.00 cm/s   TR Vmax:        231.00 cm/s MV A velocity: 114.00 cm/s MV E/A ratio:  0.71         SHUNTS                             Systemic VTI:  0.21 m                             Systemic Diam: 2.00 cm Oneil Parchment MD Electronically signed by Oneil Parchment MD Signature Date/Time: 04/05/2024/4:31:42 PM    Final    CT ANGIO GI BLEED Result Date: 04/02/2024 CLINICAL DATA:  Right red blood in stool. EXAM: CTA ABDOMEN AND PELVIS WITHOUT AND WITH CONTRAST TECHNIQUE: Multidetector CT imaging of the abdomen and pelvis was performed using the standard protocol during bolus administration of intravenous contrast. Multiplanar reconstructed images and MIPs were obtained and reviewed to evaluate the vascular  anatomy. RADIATION DOSE REDUCTION: This exam was performed according to the departmental dose-optimization program which includes automated exposure control, adjustment of the mA and/or kV according to patient size and/or use of iterative reconstruction technique. CONTRAST:  OMNIPAQUE  IOHEXOL  350 MG/ML SOLN COMPARISON:  None Available. FINDINGS: VASCULAR Aorta: Aortic atherosclerosis without aneurysm or dissection. Celiac: Patent without evidence of aneurysm, dissection, vasculitis or significant stenosis. SMA: Patent without evidence of aneurysm, dissection, vasculitis or significant stenosis. Renals: Both renal arteries are patent without evidence of aneurysm, dissection, vasculitis, fibromuscular dysplasia or significant stenosis. IMA: Patent without evidence of aneurysm, dissection, vasculitis or significant stenosis. Inflow: Patent without evidence of aneurysm, dissection, vasculitis or significant stenosis. Proximal Outflow: Bilateral common femoral and visualized portions of the superficial and profunda femoral arteries are patent without evidence of aneurysm, dissection, vasculitis or significant stenosis. Veins: No obvious venous abnormality within the limitations of this arterial phase study. Review of the MIP images confirms the above findings. NON-VASCULAR Lower chest: No acute abnormality. Hepatobiliary: No focal liver abnormality is seen. No gallstones, gallbladder wall thickening, or biliary dilatation. Pancreas: Unremarkable. No pancreatic ductal dilatation or surrounding inflammatory changes. Spleen: Normal in size without focal abnormality. Adrenals/Urinary Tract: Adrenal glands appear normal. Bilateral renal cysts are noted. Bilateral nephrolithiasis is noted. Severe right hydronephrosis is noted without ureteral dilatation or obstructing calculus, suggesting chronic UPJ obstruction. Urinary bladder is unremarkable. Stomach/Bowel: Stomach is within normal limits. Appendix appears normal. No  evidence of bowel wall thickening, distention, or inflammatory changes. Diverticulosis of descending and sigmoid colon without inflammation. No definite evidence active contrast extravasation to suggest gastrointestinal bleeding. Lymphatic: No significant adenopathy is noted. Reproductive: Prostate is unremarkable. Other: No abdominal wall hernia or abnormality. No abdominopelvic ascites. Musculoskeletal: Status post bilateral hip arthroplasties. No acute osseous abnormality is noted. IMPRESSION: 1. Aortic atherosclerosis without aneurysm or dissection. 2. No definite evidence of active gastrointestinal bleeding. 3. Diverticulosis of descending and sigmoid colon without inflammation. 4. Bilateral nephrolithiasis. 5. Severe right hydronephrosis is noted without ureteral dilatation or obstructing calculus, suggesting chronic UPJ obstruction. Aortic Atherosclerosis (ICD10-I70.0). Electronically Signed   By: Lynwood Landy Raddle M.D.   On: 04/02/2024 12:33    Assessment/Plan Acute blood loss anemia due to lower gastrointestinal bleed Acute  blood loss anemia secondary to lower GI bleed. Hemoglobin improved from 7.2 to 9.6 after transfusion. No active bleeding on colonoscopy. Had one stool with blood around it since arriving at [facility], but no further bleeding since then. - Repeat CBC in one week.  Left knee osteoarthritis with effusion Acute on chronic left knee pain with effusion. X-ray confirmed osteoarthritis. Arthrocentesis and steroid injection performed. Normal uric acid levels. - Continue Voltaren  gel for pain management. - Follow up with orthopedic as indicated.  Hypertension Controlled with Toprol  and amlodipine .  Heart murmur with diastolic dysfunction and mild valvular disease Heart murmur with TTE showing EF of 55-60%, grade one diastolic dysfunction, mild mitral regurgitation, and mild aortic valve regurgitation.  Benign prostatic hyperplasia Managed with Proscar .  Hyperlipidemia Managed  with Lipitor.  Family/ staff Communication: resident and nurse  Labs/tests ordered:  BMP CBC 1 week     [1] No Known Allergies  "

## 2024-04-12 ENCOUNTER — Encounter: Payer: Self-pay | Admitting: Internal Medicine

## 2024-04-12 ENCOUNTER — Non-Acute Institutional Stay (SKILLED_NURSING_FACILITY): Payer: Self-pay | Admitting: Internal Medicine

## 2024-04-12 DIAGNOSIS — K5731 Diverticulosis of large intestine without perforation or abscess with bleeding: Secondary | ICD-10-CM

## 2024-04-12 DIAGNOSIS — G4733 Obstructive sleep apnea (adult) (pediatric): Secondary | ICD-10-CM | POA: Diagnosis not present

## 2024-04-12 DIAGNOSIS — D62 Acute posthemorrhagic anemia: Secondary | ICD-10-CM | POA: Diagnosis not present

## 2024-04-12 DIAGNOSIS — N400 Enlarged prostate without lower urinary tract symptoms: Secondary | ICD-10-CM | POA: Diagnosis not present

## 2024-04-12 DIAGNOSIS — I10 Essential (primary) hypertension: Secondary | ICD-10-CM

## 2024-04-12 NOTE — Progress Notes (Unsigned)
 " Provider:  Dr. Fredia Bring MD Location:  [facility] Room Number: C- [id number]-P Place of Service:  SNF (31)  PCP: [name], MD Patient Care Team: [name], MD as PCP - General (Internal Medicine)  Extended Emergency Contact Information Primary Emergency Contact: [name],[name] Home Phone: [phone] Relation: Daughter  Code Status: DNR Goals of Care: Advanced Directive information    04/03/2024    8:58 AM  Advanced Directives  Does Patient Have a Medical Advance Directive? No  Would patient like information on creating a medical advance directive? No - Patient declined      Chief Complaint  Patient presents with   medication management of chronic issues    Admission    HPI: Patient is a 89 y.o. male seen today for admission to Admit to Rehab Patient was admitted in the hospital from 1/2 to 1/8 for lower  GI bleed  [place] [facility] He says he is very independent.  Still drives  He has a history of CAD, BPH, hypertension, HLD and diverticulosis.  He went to the hospital with hematochezia.  His hemoglobin had dropped from 13 to 7.2.  He got 4 units of PRBC.  The CT angio was negative for any active bleed. Colonoscopy showed old blood in the entire colon.  Diverticulosis in sigmoid colon, descending and transverse colon. He eventually stopped having any blood in stool.  And was discharged back to rehab In the hospital patient also experienced acute over Chronic left knee pain The x-ray showed large joint effusion.  Dr. Vernetta was able to aspirate 80 cc of fluid.  The fluid shows osteoarthritis with inflammation inflammatory process.  He got a steroid injection and his knee feels better Since patient has been in rehab he continues to do well has not had any recent bleed is walking and needing no nurse assist.  He wants to know if he can go back home Past Medical History:  Diagnosis Date   Arthritis     Bruises easily    CAD (coronary artery disease)    Colon polyps    Groin pain    Right   Hypercholesterolemia    takes Lipitor daily   Hypertension    takes Amlodipine ,Ramipril ,and Metoprolol  daily   Impaired hearing    Joint pain    Joint swelling    Myocardial infarction [facility])    2003   Osteoarthritis    Right hip   Sleep apnea    uses CPAP;sleep study done > 15yrs ago   Past Surgical History:  Procedure Laterality Date   CARDIAC CATHETERIZATION  >73yrs ago   CARTILAGE SURGERY  1990   Right knee   COLONOSCOPY     COLONOSCOPY N/A 04/03/2024   Procedure: COLONOSCOPY;  Surgeon: San Sandor GAILS, DO;  Location: [facility];  Service: Gastroenterology;  Laterality: N/A;   CORONARY ANGIOPLASTY     1 stent   KNEE SURGERY     bilateral   TOTAL HIP ARTHROPLASTY  05/15/2011   Procedure: TOTAL HIP ARTHROPLASTY;  Surgeon: Dempsey JINNY Sensor, MD;  Location: [facility] OR;  Service: Orthopedics;  Laterality: Right;  DEPUY/SROM/PINNACLE   TOTAL HIP ARTHROPLASTY Left 03/09/2018   Procedure: TOTAL HIP ARTHROPLASTY ANTERIOR APPROACH LEFT HIP Aspiration Fluid LEFT KNEE;  Surgeon: Yvone Rush, MD;  Location: [facility] OR;  Service: Orthopedics;  Laterality: Left;    reports that he has quit smoking. His smoking use included cigarettes. He has never used smokeless tobacco.  He reports current alcohol  use. He reports that he does not use drugs. Social History   Socioeconomic History   Marital status: Married    Spouse name: Not on file   Number of children: 3   Years of education: Not on file   Highest education level: Not on file  Occupational History   Occupation: VP airline pilot and Designer, Television/film Set: RETIRED    Comment: Teacher, Early Years/pre  Tobacco Use   Smoking status: Former    Types: Cigarettes   Smokeless tobacco: Never   Tobacco comments:    quit 93yrs ago  Substance and Sexual Activity   Alcohol  use: Yes    Comment: drinks 7 glasses wine a week   Drug use: No   Sexual activity: Not Currently   Other Topics Concern   Not on file  Social History Narrative   Metal both hips.    Social Drivers of Health   Tobacco Use: Medium Risk (04/12/2024)   Patient History    Smoking Tobacco Use: Former    Smokeless Tobacco Use: Never    Passive Exposure: Not on file  Financial Resource Strain: Not on file  Food Insecurity: No Food Insecurity (04/03/2024)   Epic    Worried About Programme Researcher, Broadcasting/film/video in the Last Year: Never true    Ran Out of Food in the Last Year: Never true  Transportation Needs: No Transportation Needs (04/03/2024)   Epic    Lack of Transportation (Medical): No    Lack of Transportation (Non-Medical): No  Physical Activity: Not on file  Stress: Not on file  Social Connections: Moderately Isolated (04/02/2024)   Social Connection and Isolation Panel    Frequency of Communication with Friends and Family: Once a week    Frequency of Social Gatherings with Friends and Family: Once a week    Attends Religious Services: 1 to 4 times per year    Active Member of [employer] or Organizations: Yes    Attends Banker Meetings: 1 to 4 times per year    Marital Status: Separated  Intimate Partner Violence: Not At Risk (04/03/2024)   Epic    Fear of Current or Ex-Partner: No    Emotionally Abused: No    Physically Abused: No    Sexually Abused: No  Depression (PHQ2-9): Not on file  Alcohol  Screen: Not on file  Housing: Low Risk (04/03/2024)   Epic    Unable to Pay for Housing in the Last Year: No    Number of Times Moved in the Last Year: 0    Homeless in the Last Year: No  Utilities: Not At Risk (04/03/2024)   Epic    Threatened with loss of utilities: No  Health Literacy: Not on file    Functional Status Survey:    Family History  Problem Relation Age of Onset   Heart disease Brother    Colon cancer Brother    Anesthesia problems Neg Hx    Hypotension Neg Hx    Malignant hyperthermia Neg Hx    Pseudochol deficiency Neg Hx    Stomach cancer Neg Hx      Health Maintenance  Topic Date Due   Zoster Vaccines- Shingrix (1 of 2) 12/02/1986   COVID-19 Vaccine (4 - 2025-26 season) 12/01/2023   Medicare Annual Wellness (AWV)  09/14/2024   DTaP/Tdap/Td (3 - Td or Tdap) 04/30/2028   Colonoscopy  04/03/2029   Pneumococcal Vaccine: 50+ Years  Completed   Influenza Vaccine  Completed  Meningococcal B Vaccine  Aged Out    Allergies[1]  Outpatient Encounter Medications as of 04/12/2024  Medication Sig   acetaminophen  (TYLENOL ) 325 MG tablet Take 650 mg by mouth as needed.   amLODipine  (NORVASC ) 10 MG tablet Take 1 tablet (10 mg total) by mouth daily.   [Paused] aspirin  EC 81 MG tablet Take 81 mg by mouth daily. Swallow whole.   atorvastatin  (LIPITOR) 40 MG tablet Take 1 tablet (40 mg total) by mouth daily.   diclofenac  Sodium (VOLTAREN ) 1 % GEL Apply 4 g topically 4 (four) times daily.   docusate sodium  (COLACE) 100 MG capsule Take 1 capsule (100 mg total) by mouth 2 (two) times daily.   finasteride  (PROSCAR ) 5 MG tablet Take 1 tablet (5 mg total) by mouth daily.   metoprolol  succinate (TOPROL -XL) 100 MG 24 hr tablet Take 1 tablet (100 mg total) by mouth daily. Take with or immediately following a meal.   polyethylene glycol (MIRALAX  / GLYCOLAX ) packet Take 17 g by mouth daily as needed for mild constipation.   senna-docusate (SENOKOT-S) 8.6-50 MG tablet Take 1 tablet by mouth at bedtime as needed for mild constipation.   tamsulosin  (FLOMAX ) 0.4 MG CAPS capsule Take 1 capsule (0.4 mg total) by mouth daily.   COVID-19 mRNA bivalent vaccine, Moderna, (MODERNA COVID-19 BIVAL BOOSTER) 50 MCG/0.5ML injection Inject into the muscle. (Patient not taking: Reported on 04/12/2024)   COVID-19 mRNA vaccine, Moderna, 100 MCG/0.5ML injection Inject into the muscle. (Patient not taking: Reported on 04/12/2024)   No facility-administered encounter medications on file as of 04/12/2024.    Review of Systems  Constitutional:  Negative for activity change,  appetite change and unexpected weight change.  HENT: Negative.    Respiratory:  Negative for cough and shortness of breath.   Cardiovascular:  Negative for leg swelling.  Gastrointestinal:  Negative for constipation.  Genitourinary:  Negative for frequency.  Musculoskeletal:  Negative for arthralgias, gait problem and myalgias.  Skin: Negative.  Negative for rash.  Neurological:  Negative for dizziness and weakness.  Psychiatric/Behavioral:  Negative for confusion and sleep disturbance.   All other systems reviewed and are negative.   Vitals:   04/12/24 1127  BP: 115/62  Pulse: (!) 59  Temp: 98.5 F (36.9 C)  SpO2: 96%  Weight: 179 lb 6.4 oz (81.4 kg)  Height: 5' 10 (1.778 m)   Body mass index is 25.74 kg/m. Physical Exam Vitals reviewed.  Constitutional:      Appearance: Normal appearance.  HENT:     Head: Normocephalic.     Nose: Nose normal.     Mouth/Throat:     Mouth: Mucous membranes are moist.     Pharynx: Oropharynx is clear.  Eyes:     Pupils: Pupils are equal, round, and reactive to light.  Cardiovascular:     Rate and Rhythm: Normal rate and regular rhythm.     Pulses: Normal pulses.     Heart sounds: No murmur heard. Pulmonary:     Effort: Pulmonary effort is normal. No respiratory distress.     Breath sounds: Normal breath sounds. No rales.  Abdominal:     General: Abdomen is flat. Bowel sounds are normal.     Palpations: Abdomen is soft.  Musculoskeletal:        General: No swelling.     Cervical back: Neck supple.  Skin:    General: Skin is warm.  Neurological:     General: No focal deficit present.     Mental Status:  He is alert and oriented to person, place, and time.  Psychiatric:        Mood and Affect: Mood normal.        Thought Content: Thought content normal.     Labs reviewed: Basic Metabolic Panel: Recent Labs    04/04/24 0711 04/05/24 0341 04/06/24 0426 04/07/24 0421 04/08/24 0446  NA 144   < > 138 138 133*  K 4.0   < >  3.6 4.2 4.4  CL 114*   < > 107 103 99  CO2 23   < > 24 26 25   GLUCOSE 108*   < > 96 112* 136*  BUN 20   < > 12 11 18   CREATININE 1.35*   < > 1.15 1.28* 1.32*  CALCIUM  7.9*   < > 8.1* 8.7* 8.3*  MG 2.3  --   --   --  2.1  PHOS  --   --   --   --  4.2   < > = values in this interval not displayed.   Liver Function Tests: Recent Labs    04/02/24 0830 04/08/24 0446  AST 24  --   ALT 24  --   ALKPHOS 75  --   BILITOT 0.7  --   PROT 6.4*  --   ALBUMIN 3.8 3.0*   No results for input(s): LIPASE, AMYLASE in the last 8760 hours. No results for input(s): AMMONIA in the last 8760 hours. CBC: Recent Labs    04/04/24 0711 04/04/24 1441 04/05/24 0341 04/05/24 1859 04/06/24 0426 04/07/24 0421 04/08/24 0446  WBC 10.0   < > 6.6   < > 8.1 10.5 9.3  NEUTROABS 8.2*  --  4.2  --   --   --   --   HGB 8.7*   < > 7.2*   < > 9.1* 10.0* 9.6*  HCT 25.8*   < > 21.7*   < > 26.4* 29.6* 28.2*  MCV 91.8   < > 92.3   < > 90.1 91.9 91.6  PLT 92*   < > 85*   < > 100* 115* 131*   < > = values in this interval not displayed.   Cardiac Enzymes: No results for input(s): CKTOTAL, CKMB, CKMBINDEX, TROPONINI in the last 8760 hours. BNP: Invalid input(s): POCBNP No results found for: HGBA1C No results found for: TSH No results found for: VITAMINB12 No results found for: FOLATE No results found for: IRON, TIBC, FERRITIN  Imaging and Procedures obtained prior to SNF admission: CT ANGIO GI BLEED Result Date: 04/02/2024 CLINICAL DATA:  Right red blood in stool. EXAM: CTA ABDOMEN AND PELVIS WITHOUT AND WITH CONTRAST TECHNIQUE: Multidetector CT imaging of the abdomen and pelvis was performed using the standard protocol during bolus administration of intravenous contrast. Multiplanar reconstructed images and MIPs were obtained and reviewed to evaluate the vascular anatomy. RADIATION DOSE REDUCTION: This exam was performed according to the departmental dose-optimization program which  includes automated exposure control, adjustment of the mA and/or kV according to patient size and/or use of iterative reconstruction technique. CONTRAST:  OMNIPAQUE  IOHEXOL  350 MG/ML SOLN COMPARISON:  None Available. FINDINGS: VASCULAR Aorta: Aortic atherosclerosis without aneurysm or dissection. Celiac: Patent without evidence of aneurysm, dissection, vasculitis or significant stenosis. SMA: Patent without evidence of aneurysm, dissection, vasculitis or significant stenosis. Renals: Both renal arteries are patent without evidence of aneurysm, dissection, vasculitis, fibromuscular dysplasia or significant stenosis. IMA: Patent without evidence of aneurysm, dissection, vasculitis or significant stenosis. Inflow: Patent  without evidence of aneurysm, dissection, vasculitis or significant stenosis. Proximal Outflow: Bilateral common femoral and visualized portions of the superficial and profunda femoral arteries are patent without evidence of aneurysm, dissection, vasculitis or significant stenosis. Veins: No obvious venous abnormality within the limitations of this arterial phase study. Review of the MIP images confirms the above findings. NON-VASCULAR Lower chest: No acute abnormality. Hepatobiliary: No focal liver abnormality is seen. No gallstones, gallbladder wall thickening, or biliary dilatation. Pancreas: Unremarkable. No pancreatic ductal dilatation or surrounding inflammatory changes. Spleen: Normal in size without focal abnormality. Adrenals/Urinary Tract: Adrenal glands appear normal. Bilateral renal cysts are noted. Bilateral nephrolithiasis is noted. Severe right hydronephrosis is noted without ureteral dilatation or obstructing calculus, suggesting chronic UPJ obstruction. Urinary bladder is unremarkable. Stomach/Bowel: Stomach is within normal limits. Appendix appears normal. No evidence of bowel wall thickening, distention, or inflammatory changes. Diverticulosis of descending and sigmoid colon  without inflammation. No definite evidence active contrast extravasation to suggest gastrointestinal bleeding. Lymphatic: No significant adenopathy is noted. Reproductive: Prostate is unremarkable. Other: No abdominal wall hernia or abnormality. No abdominopelvic ascites. Musculoskeletal: Status post bilateral hip arthroplasties. No acute osseous abnormality is noted. IMPRESSION: 1. Aortic atherosclerosis without aneurysm or dissection. 2. No definite evidence of active gastrointestinal bleeding. 3. Diverticulosis of descending and sigmoid colon without inflammation. 4. Bilateral nephrolithiasis. 5. Severe right hydronephrosis is noted without ureteral dilatation or obstructing calculus, suggesting chronic UPJ obstruction. Aortic Atherosclerosis (ICD10-I70.0). Electronically Signed   By: Lynwood Landy Raddle M.D.   On: 04/02/2024 12:33    Assessment/Plan 1. ABLA (acute blood loss anemia) (Primary) Due to diverticulosis Has not had any new incidents since discharge from the hospital Will order CBC today If stable patient can go back [place] apartment  2. Diverticulosis of colon with hemorrhage Avoid constipation  3. Primary hypertension On amlodipine   4. OSA on CPAP   5. Benign prostatic hyperplasia, unspecified whether lower urinary tract symptoms present On Proscar  and Flomax   6 Chronic Left knee Arthritis does have a follow-up with orthopedics    Family/ staff Communication:   Labs/tests ordered:        [1] No Known Allergies  "

## 2024-04-27 ENCOUNTER — Telehealth: Payer: Self-pay | Admitting: Neurology

## 2024-04-27 NOTE — Telephone Encounter (Signed)
 LVM and sent mychart informing pt reschedule is needed for appt 3/9 MD OUT

## 2024-05-19 ENCOUNTER — Ambulatory Visit: Payer: PRIVATE HEALTH INSURANCE | Admitting: Cardiology

## 2024-05-20 ENCOUNTER — Ambulatory Visit: Payer: PRIVATE HEALTH INSURANCE | Admitting: Orthopaedic Surgery

## 2024-06-07 ENCOUNTER — Ambulatory Visit: Admitting: Neurology

## 2024-06-21 ENCOUNTER — Ambulatory Visit: Admitting: Neurology
